# Patient Record
Sex: Male | Born: 1945
Health system: Southern US, Community
[De-identification: ages and names within clinical notes are randomized; demographics above are authoritative.]

## PROBLEM LIST (undated history)

## (undated) DIAGNOSIS — I2510 Atherosclerotic heart disease of native coronary artery without angina pectoris: Secondary | ICD-10-CM

## (undated) DIAGNOSIS — I10 Essential (primary) hypertension: Secondary | ICD-10-CM

## (undated) DIAGNOSIS — I219 Acute myocardial infarction, unspecified: Secondary | ICD-10-CM

## (undated) DIAGNOSIS — E785 Hyperlipidemia, unspecified: Secondary | ICD-10-CM

## (undated) DIAGNOSIS — M1990 Unspecified osteoarthritis, unspecified site: Secondary | ICD-10-CM

## (undated) DIAGNOSIS — R519 Headache, unspecified: Secondary | ICD-10-CM

## (undated) DIAGNOSIS — C8590 Non-Hodgkin lymphoma, unspecified, unspecified site: Secondary | ICD-10-CM

## (undated) DIAGNOSIS — N400 Enlarged prostate without lower urinary tract symptoms: Secondary | ICD-10-CM

## (undated) DIAGNOSIS — G510 Bell's palsy: Secondary | ICD-10-CM

## (undated) HISTORY — PX: COLONOSCOPY: SHX174

## (undated) HISTORY — PX: TONSILLECTOMY: SUR1361

---

## 1967-09-02 HISTORY — PX: CRANIOTOMY: SHX93

## 2001-09-06 ENCOUNTER — Ambulatory Visit (HOSPITAL_COMMUNITY): Admission: RE | Admit: 2001-09-06 | Discharge: 2001-09-06 | Payer: Self-pay | Admitting: Gastroenterology

## 2001-09-06 ENCOUNTER — Encounter (INDEPENDENT_AMBULATORY_CARE_PROVIDER_SITE_OTHER): Payer: Self-pay | Admitting: Specialist

## 2004-10-22 ENCOUNTER — Emergency Department (HOSPITAL_COMMUNITY): Admission: EM | Admit: 2004-10-22 | Discharge: 2004-10-22 | Payer: Self-pay | Admitting: Family Medicine

## 2006-05-12 ENCOUNTER — Emergency Department (HOSPITAL_COMMUNITY): Admission: EM | Admit: 2006-05-12 | Discharge: 2006-05-12 | Payer: Self-pay | Admitting: Family Medicine

## 2006-10-22 ENCOUNTER — Encounter (INDEPENDENT_AMBULATORY_CARE_PROVIDER_SITE_OTHER): Payer: Self-pay | Admitting: Specialist

## 2006-10-22 ENCOUNTER — Ambulatory Visit (HOSPITAL_BASED_OUTPATIENT_CLINIC_OR_DEPARTMENT_OTHER): Admission: RE | Admit: 2006-10-22 | Discharge: 2006-10-22 | Payer: Self-pay | Admitting: Orthopedic Surgery

## 2008-11-16 ENCOUNTER — Encounter: Admission: RE | Admit: 2008-11-16 | Discharge: 2008-11-16 | Payer: Self-pay | Admitting: Family Medicine

## 2008-11-22 ENCOUNTER — Ambulatory Visit (HOSPITAL_COMMUNITY): Admission: RE | Admit: 2008-11-22 | Discharge: 2008-11-22 | Payer: Self-pay | Admitting: Family Medicine

## 2008-11-28 ENCOUNTER — Encounter (INDEPENDENT_AMBULATORY_CARE_PROVIDER_SITE_OTHER): Payer: Self-pay | Admitting: Interventional Radiology

## 2008-11-28 ENCOUNTER — Encounter (INDEPENDENT_AMBULATORY_CARE_PROVIDER_SITE_OTHER): Payer: Self-pay | Admitting: Family Medicine

## 2008-11-28 ENCOUNTER — Ambulatory Visit (HOSPITAL_COMMUNITY): Admission: RE | Admit: 2008-11-28 | Discharge: 2008-11-28 | Payer: Self-pay | Admitting: Family Medicine

## 2008-12-05 ENCOUNTER — Ambulatory Visit: Payer: Self-pay | Admitting: Internal Medicine

## 2008-12-06 LAB — CBC WITH DIFFERENTIAL/PLATELET
BASO%: 0.1 % (ref 0.0–2.0)
LYMPH%: 13.2 % — ABNORMAL LOW (ref 14.0–49.0)
MCHC: 33.5 g/dL (ref 32.0–36.0)
MONO#: 0.5 10*3/uL (ref 0.1–0.9)
Platelets: 344 10*3/uL (ref 140–400)
RBC: 5.32 10*6/uL (ref 4.20–5.82)
RDW: 13 % (ref 11.0–14.6)
WBC: 6.8 10*3/uL (ref 4.0–10.3)
lymph#: 0.9 10*3/uL (ref 0.9–3.3)

## 2008-12-06 LAB — COMPREHENSIVE METABOLIC PANEL
ALT: 23 U/L (ref 0–53)
CO2: 24 mEq/L (ref 19–32)
Potassium: 4.4 mEq/L (ref 3.5–5.3)
Sodium: 138 mEq/L (ref 135–145)
Total Bilirubin: 0.5 mg/dL (ref 0.3–1.2)
Total Protein: 6.4 g/dL (ref 6.0–8.3)

## 2008-12-06 LAB — LACTATE DEHYDROGENASE: LDH: 450 U/L — ABNORMAL HIGH (ref 94–250)

## 2008-12-07 ENCOUNTER — Other Ambulatory Visit: Admission: RE | Admit: 2008-12-07 | Discharge: 2008-12-07 | Payer: Self-pay | Admitting: Internal Medicine

## 2008-12-07 ENCOUNTER — Encounter: Payer: Self-pay | Admitting: Internal Medicine

## 2008-12-08 ENCOUNTER — Encounter: Payer: Self-pay | Admitting: Internal Medicine

## 2008-12-08 ENCOUNTER — Ambulatory Visit: Payer: Self-pay

## 2008-12-12 LAB — COMPREHENSIVE METABOLIC PANEL WITH GFR
ALT: 17 U/L (ref 0–53)
AST: 15 U/L (ref 0–37)
Albumin: 4.1 g/dL (ref 3.5–5.2)
Alkaline Phosphatase: 141 U/L — ABNORMAL HIGH (ref 39–117)
BUN: 16 mg/dL (ref 6–23)
CO2: 25 meq/L (ref 19–32)
Calcium: 9.4 mg/dL (ref 8.4–10.5)
Chloride: 102 meq/L (ref 96–112)
Creatinine, Ser: 0.71 mg/dL (ref 0.40–1.50)
Glucose, Bld: 127 mg/dL — ABNORMAL HIGH (ref 70–99)
Potassium: 4.6 meq/L (ref 3.5–5.3)
Sodium: 139 meq/L (ref 135–145)
Total Bilirubin: 0.6 mg/dL (ref 0.3–1.2)
Total Protein: 6.6 g/dL (ref 6.0–8.3)

## 2008-12-12 LAB — CBC WITH DIFFERENTIAL/PLATELET
BASO%: 0.3 % (ref 0.0–2.0)
Basophils Absolute: 0 10e3/uL (ref 0.0–0.1)
EOS%: 1 % (ref 0.0–7.0)
Eosinophils Absolute: 0.1 10e3/uL (ref 0.0–0.5)
HCT: 44.2 % (ref 38.4–49.9)
HGB: 15.3 g/dL (ref 13.0–17.1)
LYMPH%: 7.2 % — ABNORMAL LOW (ref 14.0–49.0)
MCH: 28.2 pg (ref 27.2–33.4)
MCHC: 34.6 g/dL (ref 32.0–36.0)
MCV: 81.5 fL (ref 79.3–98.0)
MONO#: 0.5 10e3/uL (ref 0.1–0.9)
MONO%: 7.5 % (ref 0.0–14.0)
NEUT#: 6 10e3/uL (ref 1.5–6.5)
NEUT%: 84 % — ABNORMAL HIGH (ref 39.0–75.0)
Platelets: 317 10e3/uL (ref 140–400)
RBC: 5.42 10e6/uL (ref 4.20–5.82)
RDW: 13.1 % (ref 11.0–14.6)
WBC: 7.1 10e3/uL (ref 4.0–10.3)
lymph#: 0.5 10e3/uL — ABNORMAL LOW (ref 0.9–3.3)

## 2008-12-12 LAB — URIC ACID: Uric Acid, Serum: 3 mg/dL — ABNORMAL LOW (ref 4.0–7.8)

## 2008-12-12 LAB — LACTATE DEHYDROGENASE: LDH: 631 U/L — ABNORMAL HIGH (ref 94–250)

## 2008-12-20 LAB — CBC WITH DIFFERENTIAL/PLATELET
BASO%: 0.8 % (ref 0.0–2.0)
Basophils Absolute: 0 10*3/uL (ref 0.0–0.1)
EOS%: 3.5 % (ref 0.0–7.0)
HCT: 42.1 % (ref 38.4–49.9)
HGB: 14.1 g/dL (ref 13.0–17.1)
LYMPH%: 31.7 % (ref 14.0–49.0)
MCH: 28.1 pg (ref 27.2–33.4)
MCHC: 33.5 g/dL (ref 32.0–36.0)
MCV: 83.7 fL (ref 79.3–98.0)
MONO%: 12.5 % (ref 0.0–14.0)
NEUT%: 51.5 % (ref 39.0–75.0)

## 2008-12-20 LAB — URIC ACID: Uric Acid, Serum: 2.1 mg/dL — ABNORMAL LOW (ref 4.0–7.8)

## 2008-12-20 LAB — COMPREHENSIVE METABOLIC PANEL
Albumin: 4.1 g/dL (ref 3.5–5.2)
CO2: 27 mEq/L (ref 19–32)
Calcium: 8.8 mg/dL (ref 8.4–10.5)
Chloride: 102 mEq/L (ref 96–112)
Glucose, Bld: 100 mg/dL — ABNORMAL HIGH (ref 70–99)
Potassium: 4.5 mEq/L (ref 3.5–5.3)
Sodium: 137 mEq/L (ref 135–145)
Total Bilirubin: 0.3 mg/dL (ref 0.3–1.2)
Total Protein: 5.9 g/dL — ABNORMAL LOW (ref 6.0–8.3)

## 2008-12-20 LAB — LACTATE DEHYDROGENASE: LDH: 165 U/L (ref 94–250)

## 2008-12-27 LAB — CBC WITH DIFFERENTIAL/PLATELET
BASO%: 0 % (ref 0.0–2.0)
EOS%: 0.1 % (ref 0.0–7.0)
MCH: 28.5 pg (ref 27.2–33.4)
MCHC: 34.2 g/dL (ref 32.0–36.0)
MONO%: 5.1 % (ref 0.0–14.0)
RBC: 4.96 10*6/uL (ref 4.20–5.82)
RDW: 13.4 % (ref 11.0–14.6)
lymph#: 0.8 10*3/uL — ABNORMAL LOW (ref 0.9–3.3)

## 2008-12-27 LAB — COMPREHENSIVE METABOLIC PANEL
ALT: 12 U/L (ref 0–53)
AST: 11 U/L (ref 0–37)
Albumin: 4.1 g/dL (ref 3.5–5.2)
Alkaline Phosphatase: 115 U/L (ref 39–117)
Calcium: 8.9 mg/dL (ref 8.4–10.5)
Chloride: 105 mEq/L (ref 96–112)
Potassium: 4.3 mEq/L (ref 3.5–5.3)

## 2009-01-10 LAB — CBC WITH DIFFERENTIAL/PLATELET
BASO%: 3.5 % — ABNORMAL HIGH (ref 0.0–2.0)
Basophils Absolute: 0 10*3/uL (ref 0.0–0.1)
EOS%: 3.5 % (ref 0.0–7.0)
HGB: 13.3 g/dL (ref 13.0–17.1)
MCH: 28.1 pg (ref 27.2–33.4)
MCHC: 34.9 g/dL (ref 32.0–36.0)
RBC: 4.74 10*6/uL (ref 4.20–5.82)
RDW: 13.7 % (ref 11.0–14.6)
lymph#: 0.4 10*3/uL — ABNORMAL LOW (ref 0.9–3.3)

## 2009-01-10 LAB — COMPREHENSIVE METABOLIC PANEL
ALT: 15 U/L (ref 0–53)
AST: 10 U/L (ref 0–37)
Albumin: 4.2 g/dL (ref 3.5–5.2)
Calcium: 9.2 mg/dL (ref 8.4–10.5)
Chloride: 103 mEq/L (ref 96–112)
Potassium: 4.3 mEq/L (ref 3.5–5.3)
Sodium: 138 mEq/L (ref 135–145)
Total Protein: 6.2 g/dL (ref 6.0–8.3)

## 2009-01-17 ENCOUNTER — Ambulatory Visit: Payer: Self-pay | Admitting: Internal Medicine

## 2009-01-17 LAB — CBC WITH DIFFERENTIAL/PLATELET
BASO%: 0.2 % (ref 0.0–2.0)
EOS%: 0.2 % (ref 0.0–7.0)
LYMPH%: 7.1 % — ABNORMAL LOW (ref 14.0–49.0)
MCHC: 34.1 g/dL (ref 32.0–36.0)
MCV: 84.3 fL (ref 79.3–98.0)
MONO%: 5.5 % (ref 0.0–14.0)
Platelets: 227 10*3/uL (ref 140–400)
RBC: 4.64 10*6/uL (ref 4.20–5.82)
RDW: 15.1 % — ABNORMAL HIGH (ref 11.0–14.6)

## 2009-01-17 LAB — COMPREHENSIVE METABOLIC PANEL
ALT: 12 U/L (ref 0–53)
AST: 10 U/L (ref 0–37)
Alkaline Phosphatase: 109 U/L (ref 39–117)
Sodium: 141 mEq/L (ref 135–145)
Total Bilirubin: 0.2 mg/dL — ABNORMAL LOW (ref 0.3–1.2)
Total Protein: 5.7 g/dL — ABNORMAL LOW (ref 6.0–8.3)

## 2009-01-17 LAB — URIC ACID: Uric Acid, Serum: 2.6 mg/dL — ABNORMAL LOW (ref 4.0–7.8)

## 2009-01-23 LAB — CBC WITH DIFFERENTIAL/PLATELET
BASO%: 0.7 % (ref 0.0–2.0)
EOS%: 0 % (ref 0.0–7.0)
MCH: 28.2 pg (ref 27.2–33.4)
MCHC: 34.3 g/dL (ref 32.0–36.0)
MCV: 82.2 fL (ref 79.3–98.0)
MONO%: 8.6 % (ref 0.0–14.0)
RBC: 5.11 10*6/uL (ref 4.20–5.82)
RDW: 15.7 % — ABNORMAL HIGH (ref 11.0–14.6)
lymph#: 0.6 10*3/uL — ABNORMAL LOW (ref 0.9–3.3)
nRBC: 0 % (ref 0–0)

## 2009-01-23 LAB — COMPREHENSIVE METABOLIC PANEL
ALT: 16 U/L (ref 0–53)
CO2: 23 mEq/L (ref 19–32)
Calcium: 9.1 mg/dL (ref 8.4–10.5)
Chloride: 105 mEq/L (ref 96–112)
Creatinine, Ser: 0.63 mg/dL (ref 0.40–1.50)
Glucose, Bld: 124 mg/dL — ABNORMAL HIGH (ref 70–99)

## 2009-01-23 LAB — URIC ACID: Uric Acid, Serum: 2.7 mg/dL — ABNORMAL LOW (ref 4.0–7.8)

## 2009-01-23 LAB — LACTATE DEHYDROGENASE: LDH: 174 U/L (ref 94–250)

## 2009-01-31 LAB — COMPREHENSIVE METABOLIC PANEL
Alkaline Phosphatase: 90 U/L (ref 39–117)
BUN: 16 mg/dL (ref 6–23)
CO2: 27 mEq/L (ref 19–32)
Creatinine, Ser: 0.73 mg/dL (ref 0.40–1.50)
Glucose, Bld: 131 mg/dL — ABNORMAL HIGH (ref 70–99)
Total Bilirubin: 0.4 mg/dL (ref 0.3–1.2)
Total Protein: 6.1 g/dL (ref 6.0–8.3)

## 2009-01-31 LAB — CBC WITH DIFFERENTIAL/PLATELET
Basophils Absolute: 0 10*3/uL (ref 0.0–0.1)
Eosinophils Absolute: 0 10*3/uL (ref 0.0–0.5)
HCT: 36.4 % — ABNORMAL LOW (ref 38.4–49.9)
HGB: 12.4 g/dL — ABNORMAL LOW (ref 13.0–17.1)
LYMPH%: 35.7 % (ref 14.0–49.0)
MCV: 84.5 fL (ref 79.3–98.0)
MONO#: 0.3 10*3/uL (ref 0.1–0.9)
MONO%: 23.9 % — ABNORMAL HIGH (ref 0.0–14.0)
NEUT#: 0.4 10*3/uL — CL (ref 1.5–6.5)
NEUT%: 36.3 % — ABNORMAL LOW (ref 39.0–75.0)
Platelets: 162 10*3/uL (ref 140–400)
RBC: 4.31 10*6/uL (ref 4.20–5.82)
WBC: 1.1 10*3/uL — ABNORMAL LOW (ref 4.0–10.3)

## 2009-01-31 LAB — LACTATE DEHYDROGENASE: LDH: 95 U/L (ref 94–250)

## 2009-01-31 LAB — URIC ACID: Uric Acid, Serum: 2.6 mg/dL — ABNORMAL LOW (ref 4.0–7.8)

## 2009-02-06 ENCOUNTER — Ambulatory Visit (HOSPITAL_COMMUNITY): Admission: RE | Admit: 2009-02-06 | Discharge: 2009-02-06 | Payer: Self-pay | Admitting: Internal Medicine

## 2009-02-06 LAB — CBC WITH DIFFERENTIAL/PLATELET
BASO%: 0.6 % (ref 0.0–2.0)
Basophils Absolute: 0 10*3/uL (ref 0.0–0.1)
EOS%: 0.5 % (ref 0.0–7.0)
HCT: 42.3 % (ref 38.4–49.9)
HGB: 14.5 g/dL (ref 13.0–17.1)
MCH: 28.7 pg (ref 27.2–33.4)
MCHC: 34.3 g/dL (ref 32.0–36.0)
MONO#: 0.5 10*3/uL (ref 0.1–0.9)
NEUT%: 83.7 % — ABNORMAL HIGH (ref 39.0–75.0)
RDW: 16.9 % — ABNORMAL HIGH (ref 11.0–14.6)
WBC: 6.6 10*3/uL (ref 4.0–10.3)
lymph#: 0.5 10*3/uL — ABNORMAL LOW (ref 0.9–3.3)

## 2009-02-06 LAB — COMPREHENSIVE METABOLIC PANEL
ALT: 15 U/L (ref 0–53)
AST: 14 U/L (ref 0–37)
Alkaline Phosphatase: 104 U/L (ref 39–117)
Creatinine, Ser: 0.68 mg/dL (ref 0.40–1.50)
Sodium: 141 mEq/L (ref 135–145)
Total Bilirubin: 0.4 mg/dL (ref 0.3–1.2)
Total Protein: 6.3 g/dL (ref 6.0–8.3)

## 2009-02-12 LAB — COMPREHENSIVE METABOLIC PANEL
AST: 14 U/L (ref 0–37)
Albumin: 4 g/dL (ref 3.5–5.2)
Alkaline Phosphatase: 92 U/L (ref 39–117)
BUN: 14 mg/dL (ref 6–23)
Calcium: 9 mg/dL (ref 8.4–10.5)
Chloride: 105 mEq/L (ref 96–112)
Creatinine, Ser: 0.75 mg/dL (ref 0.40–1.50)
Glucose, Bld: 98 mg/dL (ref 70–99)
Potassium: 4.5 mEq/L (ref 3.5–5.3)

## 2009-02-12 LAB — CBC WITH DIFFERENTIAL/PLATELET
Basophils Absolute: 0 10*3/uL (ref 0.0–0.1)
EOS%: 0.1 % (ref 0.0–7.0)
Eosinophils Absolute: 0 10*3/uL (ref 0.0–0.5)
HCT: 39.1 % (ref 38.4–49.9)
HGB: 13.6 g/dL (ref 13.0–17.1)
MCH: 29.9 pg (ref 27.2–33.4)
MCV: 85.6 fL (ref 79.3–98.0)
MONO%: 16 % — ABNORMAL HIGH (ref 0.0–14.0)
NEUT#: 3.5 10*3/uL (ref 1.5–6.5)
NEUT%: 69.5 % (ref 39.0–75.0)

## 2009-02-20 LAB — CBC WITH DIFFERENTIAL/PLATELET
Basophils Absolute: 0 10*3/uL (ref 0.0–0.1)
Eosinophils Absolute: 0 10*3/uL (ref 0.0–0.5)
HCT: 36.3 % — ABNORMAL LOW (ref 38.4–49.9)
HGB: 12.9 g/dL — ABNORMAL LOW (ref 13.0–17.1)
LYMPH%: 25.9 % (ref 14.0–49.0)
MONO#: 0.1 10*3/uL (ref 0.1–0.9)
NEUT#: 0.6 10*3/uL — ABNORMAL LOW (ref 1.5–6.5)
Platelets: 146 10*3/uL (ref 140–400)
RBC: 4.24 10*6/uL (ref 4.20–5.82)
WBC: 1 10*3/uL — ABNORMAL LOW (ref 4.0–10.3)

## 2009-02-20 LAB — COMPREHENSIVE METABOLIC PANEL
Albumin: 4.2 g/dL (ref 3.5–5.2)
BUN: 17 mg/dL (ref 6–23)
CO2: 29 mEq/L (ref 19–32)
Glucose, Bld: 125 mg/dL — ABNORMAL HIGH (ref 70–99)
Sodium: 137 mEq/L (ref 135–145)
Total Bilirubin: 0.7 mg/dL (ref 0.3–1.2)
Total Protein: 6.4 g/dL (ref 6.0–8.3)

## 2009-02-20 LAB — LACTATE DEHYDROGENASE: LDH: 152 U/L (ref 94–250)

## 2009-02-23 ENCOUNTER — Ambulatory Visit: Payer: Self-pay | Admitting: Internal Medicine

## 2009-02-27 LAB — LACTATE DEHYDROGENASE: LDH: 209 U/L (ref 94–250)

## 2009-02-27 LAB — COMPREHENSIVE METABOLIC PANEL
ALT: 17 U/L (ref 0–53)
CO2: 25 mEq/L (ref 19–32)
Calcium: 9 mg/dL (ref 8.4–10.5)
Chloride: 105 mEq/L (ref 96–112)
Creatinine, Ser: 0.75 mg/dL (ref 0.40–1.50)

## 2009-02-27 LAB — CBC WITH DIFFERENTIAL/PLATELET
BASO%: 0 % (ref 0.0–2.0)
HCT: 39.1 % (ref 38.4–49.9)
MCHC: 34.3 g/dL (ref 32.0–36.0)
MONO#: 0.6 10*3/uL (ref 0.1–0.9)
NEUT#: 7.1 10*3/uL — ABNORMAL HIGH (ref 1.5–6.5)
RBC: 4.48 10*6/uL (ref 4.20–5.82)
WBC: 8.3 10*3/uL (ref 4.0–10.3)
lymph#: 0.6 10*3/uL — ABNORMAL LOW (ref 0.9–3.3)

## 2009-03-06 LAB — COMPREHENSIVE METABOLIC PANEL
Albumin: 4.1 g/dL (ref 3.5–5.2)
Alkaline Phosphatase: 132 U/L — ABNORMAL HIGH (ref 39–117)
BUN: 13 mg/dL (ref 6–23)
Creatinine, Ser: 0.58 mg/dL (ref 0.40–1.50)
Glucose, Bld: 124 mg/dL — ABNORMAL HIGH (ref 70–99)
Total Bilirubin: 0.5 mg/dL (ref 0.3–1.2)

## 2009-03-06 LAB — CBC WITH DIFFERENTIAL/PLATELET
Basophils Absolute: 0 10*3/uL (ref 0.0–0.1)
EOS%: 0 % (ref 0.0–7.0)
Eosinophils Absolute: 0 10*3/uL (ref 0.0–0.5)
HGB: 13.6 g/dL (ref 13.0–17.1)
LYMPH%: 12.1 % — ABNORMAL LOW (ref 14.0–49.0)
MCH: 29.2 pg (ref 27.2–33.4)
MCV: 85.2 fL (ref 79.3–98.0)
MONO%: 9 % (ref 0.0–14.0)
NEUT#: 5.3 10*3/uL (ref 1.5–6.5)
NEUT%: 78.3 % — ABNORMAL HIGH (ref 39.0–75.0)
Platelets: 258 10*3/uL (ref 140–400)

## 2009-03-22 ENCOUNTER — Ambulatory Visit: Payer: Self-pay | Admitting: Internal Medicine

## 2009-03-26 LAB — COMPREHENSIVE METABOLIC PANEL
Albumin: 4.3 g/dL (ref 3.5–5.2)
Alkaline Phosphatase: 134 U/L — ABNORMAL HIGH (ref 39–117)
CO2: 24 mEq/L (ref 19–32)
Calcium: 9.4 mg/dL (ref 8.4–10.5)
Chloride: 107 mEq/L (ref 96–112)
Glucose, Bld: 99 mg/dL (ref 70–99)
Potassium: 5 mEq/L (ref 3.5–5.3)
Sodium: 138 mEq/L (ref 135–145)
Total Protein: 6.4 g/dL (ref 6.0–8.3)

## 2009-03-26 LAB — CBC WITH DIFFERENTIAL/PLATELET
BASO%: 0.5 % (ref 0.0–2.0)
LYMPH%: 12.1 % — ABNORMAL LOW (ref 14.0–49.0)
MCHC: 33.3 g/dL (ref 32.0–36.0)
MONO#: 0.8 10*3/uL (ref 0.1–0.9)
MONO%: 12.2 % (ref 0.0–14.0)
Platelets: 211 10*3/uL (ref 140–400)
RBC: 4.48 10*6/uL (ref 4.20–5.82)
WBC: 6.6 10*3/uL (ref 4.0–10.3)

## 2009-04-11 ENCOUNTER — Ambulatory Visit (HOSPITAL_COMMUNITY): Admission: RE | Admit: 2009-04-11 | Discharge: 2009-04-11 | Payer: Self-pay | Admitting: Internal Medicine

## 2009-04-16 LAB — COMPREHENSIVE METABOLIC PANEL
ALT: 14 U/L (ref 0–53)
AST: 20 U/L (ref 0–37)
Alkaline Phosphatase: 117 U/L (ref 39–117)
BUN: 13 mg/dL (ref 6–23)
Creatinine, Ser: 0.63 mg/dL (ref 0.40–1.50)
Potassium: 4.8 mEq/L (ref 3.5–5.3)

## 2009-04-16 LAB — CBC WITH DIFFERENTIAL/PLATELET
BASO%: 0.4 % (ref 0.0–2.0)
Basophils Absolute: 0 10*3/uL (ref 0.0–0.1)
EOS%: 0.1 % (ref 0.0–7.0)
HGB: 13 g/dL (ref 13.0–17.1)
MCH: 29.4 pg (ref 27.2–33.4)
MCHC: 33.2 g/dL (ref 32.0–36.0)
MCV: 88.5 fL (ref 79.3–98.0)
MONO%: 16.3 % — ABNORMAL HIGH (ref 0.0–14.0)
RDW: 14.3 % (ref 11.0–14.6)
lymph#: 0.5 10*3/uL — ABNORMAL LOW (ref 0.9–3.3)

## 2009-07-04 ENCOUNTER — Ambulatory Visit: Payer: Self-pay | Admitting: Internal Medicine

## 2009-07-06 ENCOUNTER — Ambulatory Visit (HOSPITAL_COMMUNITY): Admission: RE | Admit: 2009-07-06 | Discharge: 2009-07-06 | Payer: Self-pay | Admitting: Internal Medicine

## 2009-07-06 LAB — CBC WITH DIFFERENTIAL/PLATELET
BASO%: 0.4 % (ref 0.0–2.0)
EOS%: 8.3 % — ABNORMAL HIGH (ref 0.0–7.0)
HCT: 48.7 % (ref 38.4–49.9)
MCH: 28.1 pg (ref 27.2–33.4)
MCHC: 33.8 g/dL (ref 32.0–36.0)
MONO#: 0.5 10*3/uL (ref 0.1–0.9)
NEUT%: 51.1 % (ref 39.0–75.0)
RBC: 5.85 10*6/uL — ABNORMAL HIGH (ref 4.20–5.82)
RDW: 15.8 % — ABNORMAL HIGH (ref 11.0–14.6)
WBC: 4.6 10*3/uL (ref 4.0–10.3)
lymph#: 1.4 10*3/uL (ref 0.9–3.3)

## 2009-07-06 LAB — COMPREHENSIVE METABOLIC PANEL
ALT: 35 U/L (ref 0–53)
AST: 30 U/L (ref 0–37)
Albumin: 4.4 g/dL (ref 3.5–5.2)
CO2: 27 mEq/L (ref 19–32)
Calcium: 9.3 mg/dL (ref 8.4–10.5)
Chloride: 105 mEq/L (ref 96–112)
Creatinine, Ser: 0.73 mg/dL (ref 0.40–1.50)
Potassium: 4.2 mEq/L (ref 3.5–5.3)
Sodium: 138 mEq/L (ref 135–145)
Total Protein: 6.7 g/dL (ref 6.0–8.3)

## 2009-07-06 LAB — LACTATE DEHYDROGENASE: LDH: 218 U/L (ref 94–250)

## 2009-10-09 ENCOUNTER — Ambulatory Visit: Payer: Self-pay | Admitting: Internal Medicine

## 2009-10-11 LAB — COMPREHENSIVE METABOLIC PANEL
Albumin: 4.5 g/dL (ref 3.5–5.2)
BUN: 17 mg/dL (ref 6–23)
Calcium: 9.1 mg/dL (ref 8.4–10.5)
Chloride: 107 mEq/L (ref 96–112)
Creatinine, Ser: 0.81 mg/dL (ref 0.40–1.50)
Glucose, Bld: 123 mg/dL — ABNORMAL HIGH (ref 70–99)
Potassium: 3.9 mEq/L (ref 3.5–5.3)

## 2009-10-11 LAB — CBC WITH DIFFERENTIAL/PLATELET
Basophils Absolute: 0 10*3/uL (ref 0.0–0.1)
EOS%: 6.3 % (ref 0.0–7.0)
Eosinophils Absolute: 0.3 10*3/uL (ref 0.0–0.5)
HCT: 43.8 % (ref 38.4–49.9)
HGB: 15 g/dL (ref 13.0–17.1)
MCH: 29.1 pg (ref 27.2–33.4)
MCV: 84.9 fL (ref 79.3–98.0)
NEUT#: 2.4 10*3/uL (ref 1.5–6.5)
NEUT%: 57.5 % (ref 39.0–75.0)
RDW: 14.5 % (ref 11.0–14.6)
lymph#: 1.1 10*3/uL (ref 0.9–3.3)

## 2009-10-11 LAB — LACTATE DEHYDROGENASE: LDH: 137 U/L (ref 94–250)

## 2009-10-12 ENCOUNTER — Ambulatory Visit (HOSPITAL_COMMUNITY): Admission: RE | Admit: 2009-10-12 | Discharge: 2009-10-12 | Payer: Self-pay | Admitting: Internal Medicine

## 2010-01-01 ENCOUNTER — Ambulatory Visit: Payer: Self-pay | Admitting: Internal Medicine

## 2010-01-02 LAB — CBC WITH DIFFERENTIAL/PLATELET
BASO%: 0.7 % (ref 0.0–2.0)
Basophils Absolute: 0 10*3/uL (ref 0.0–0.1)
EOS%: 7.2 % — ABNORMAL HIGH (ref 0.0–7.0)
HCT: 48.2 % (ref 38.4–49.9)
HGB: 16.6 g/dL (ref 13.0–17.1)
LYMPH%: 19.5 % (ref 14.0–49.0)
MCH: 29.2 pg (ref 27.2–33.4)
MCHC: 34.4 g/dL (ref 32.0–36.0)
MCV: 84.7 fL (ref 79.3–98.0)
NEUT%: 62.8 % (ref 39.0–75.0)
Platelets: 203 10*3/uL (ref 140–400)

## 2010-01-02 LAB — COMPREHENSIVE METABOLIC PANEL
ALT: 18 U/L (ref 0–53)
AST: 17 U/L (ref 0–37)
BUN: 16 mg/dL (ref 6–23)
Calcium: 9 mg/dL (ref 8.4–10.5)
Creatinine, Ser: 0.64 mg/dL (ref 0.40–1.50)
Total Bilirubin: 0.4 mg/dL (ref 0.3–1.2)

## 2010-01-02 LAB — LACTATE DEHYDROGENASE: LDH: 133 U/L (ref 94–250)

## 2010-03-01 ENCOUNTER — Ambulatory Visit: Payer: Self-pay | Admitting: Internal Medicine

## 2010-03-06 LAB — COMPREHENSIVE METABOLIC PANEL
Alkaline Phosphatase: 92 U/L (ref 39–117)
Creatinine, Ser: 0.67 mg/dL (ref 0.40–1.50)
Glucose, Bld: 105 mg/dL — ABNORMAL HIGH (ref 70–99)
Sodium: 139 mEq/L (ref 135–145)
Total Bilirubin: 0.6 mg/dL (ref 0.3–1.2)
Total Protein: 6.6 g/dL (ref 6.0–8.3)

## 2010-03-06 LAB — CBC WITH DIFFERENTIAL/PLATELET
BASO%: 0.6 % (ref 0.0–2.0)
Basophils Absolute: 0 10*3/uL (ref 0.0–0.1)
Eosinophils Absolute: 0.3 10*3/uL (ref 0.0–0.5)
HCT: 46.1 % (ref 38.4–49.9)
HGB: 16 g/dL (ref 13.0–17.1)
LYMPH%: 27.8 % (ref 14.0–49.0)
MCHC: 34.7 g/dL (ref 32.0–36.0)
MONO#: 0.7 10*3/uL (ref 0.1–0.9)
NEUT%: 45.3 % (ref 39.0–75.0)
Platelets: 187 10*3/uL (ref 140–400)
WBC: 3.5 10*3/uL — ABNORMAL LOW (ref 4.0–10.3)

## 2010-05-13 ENCOUNTER — Ambulatory Visit: Payer: Self-pay | Admitting: Internal Medicine

## 2010-05-13 ENCOUNTER — Ambulatory Visit (HOSPITAL_COMMUNITY): Admission: RE | Admit: 2010-05-13 | Discharge: 2010-05-13 | Payer: Self-pay | Admitting: Internal Medicine

## 2010-06-06 LAB — CBC WITH DIFFERENTIAL/PLATELET
Eosinophils Absolute: 0.2 10*3/uL (ref 0.0–0.5)
MONO#: 0.5 10*3/uL (ref 0.1–0.9)
NEUT#: 1.7 10*3/uL (ref 1.5–6.5)
RBC: 5.46 10*6/uL (ref 4.20–5.82)
RDW: 13.5 % (ref 11.0–14.6)
WBC: 3.5 10*3/uL — ABNORMAL LOW (ref 4.0–10.3)

## 2010-06-06 LAB — COMPREHENSIVE METABOLIC PANEL
Albumin: 4.7 g/dL (ref 3.5–5.2)
Alkaline Phosphatase: 89 U/L (ref 39–117)
CO2: 21 mEq/L (ref 19–32)
Glucose, Bld: 106 mg/dL — ABNORMAL HIGH (ref 70–99)
Potassium: 4.5 mEq/L (ref 3.5–5.3)
Sodium: 138 mEq/L (ref 135–145)
Total Protein: 6.7 g/dL (ref 6.0–8.3)

## 2010-06-06 LAB — LACTATE DEHYDROGENASE: LDH: 209 U/L (ref 94–250)

## 2010-06-26 ENCOUNTER — Ambulatory Visit: Payer: Self-pay | Admitting: Internal Medicine

## 2010-09-20 ENCOUNTER — Other Ambulatory Visit: Payer: Self-pay | Admitting: Internal Medicine

## 2010-09-20 DIAGNOSIS — Z8572 Personal history of non-Hodgkin lymphomas: Secondary | ICD-10-CM

## 2010-09-22 ENCOUNTER — Encounter: Payer: Self-pay | Admitting: Internal Medicine

## 2010-11-29 ENCOUNTER — Other Ambulatory Visit (HOSPITAL_COMMUNITY): Payer: Self-pay

## 2010-11-29 ENCOUNTER — Encounter (HOSPITAL_COMMUNITY): Payer: Self-pay

## 2010-11-29 ENCOUNTER — Ambulatory Visit (HOSPITAL_COMMUNITY)
Admission: RE | Admit: 2010-11-29 | Discharge: 2010-11-29 | Disposition: A | Discharge status: Home / Self Care | Payer: 59 | Source: Ambulatory Visit | Attending: Internal Medicine | Admitting: Internal Medicine

## 2010-11-29 DIAGNOSIS — M949 Disorder of cartilage, unspecified: Secondary | ICD-10-CM | POA: Insufficient documentation

## 2010-11-29 DIAGNOSIS — M899 Disorder of bone, unspecified: Secondary | ICD-10-CM | POA: Insufficient documentation

## 2010-11-29 DIAGNOSIS — C8589 Other specified types of non-Hodgkin lymphoma, extranodal and solid organ sites: Secondary | ICD-10-CM | POA: Insufficient documentation

## 2010-11-29 DIAGNOSIS — G9389 Other specified disorders of brain: Secondary | ICD-10-CM | POA: Insufficient documentation

## 2010-11-29 DIAGNOSIS — Z8572 Personal history of non-Hodgkin lymphomas: Secondary | ICD-10-CM

## 2010-11-29 HISTORY — DX: Non-Hodgkin lymphoma, unspecified, unspecified site: C85.90

## 2010-11-29 MED ORDER — IOHEXOL 300 MG/ML  SOLN
125.0000 mL | Freq: Once | INTRAMUSCULAR | Status: AC | PRN
  Administered 2010-11-29: 125 mL via INTRAVENOUS

## 2010-12-04 ENCOUNTER — Other Ambulatory Visit: Payer: Self-pay | Admitting: Internal Medicine

## 2010-12-04 ENCOUNTER — Encounter (HOSPITAL_BASED_OUTPATIENT_CLINIC_OR_DEPARTMENT_OTHER): Payer: 59 | Admitting: Internal Medicine

## 2010-12-04 DIAGNOSIS — C8590 Non-Hodgkin lymphoma, unspecified, unspecified site: Secondary | ICD-10-CM

## 2010-12-04 DIAGNOSIS — Z5111 Encounter for antineoplastic chemotherapy: Secondary | ICD-10-CM

## 2010-12-04 DIAGNOSIS — C8588 Other specified types of non-Hodgkin lymphoma, lymph nodes of multiple sites: Secondary | ICD-10-CM

## 2010-12-04 LAB — CBC WITH DIFFERENTIAL/PLATELET
Basophils Absolute: 0 10*3/uL (ref 0.0–0.1)
Eosinophils Absolute: 0.3 10*3/uL (ref 0.0–0.5)
HGB: 15.4 g/dL (ref 13.0–17.1)
MONO#: 0.5 10*3/uL (ref 0.1–0.9)
MONO%: 11.5 % (ref 0.0–14.0)
NEUT#: 2.7 10*3/uL (ref 1.5–6.5)
RBC: 5.24 10*6/uL (ref 4.20–5.82)
RDW: 13.9 % (ref 11.0–14.6)
WBC: 4.6 10*3/uL (ref 4.0–10.3)
lymph#: 1.1 10*3/uL (ref 0.9–3.3)

## 2010-12-04 LAB — COMPREHENSIVE METABOLIC PANEL
ALT: 16 U/L (ref 0–53)
AST: 15 U/L (ref 0–37)
Alkaline Phosphatase: 81 U/L (ref 39–117)
Sodium: 138 mEq/L (ref 135–145)
Total Bilirubin: 0.7 mg/dL (ref 0.3–1.2)
Total Protein: 6 g/dL (ref 6.0–8.3)

## 2010-12-04 LAB — LACTATE DEHYDROGENASE: LDH: 132 U/L (ref 94–250)

## 2010-12-11 LAB — CHROMOSOME ANALYSIS, BONE MARROW

## 2010-12-11 LAB — DIFFERENTIAL
Basophils Relative: 0 % (ref 0–1)
Lymphs Abs: 0.8 10*3/uL (ref 0.7–4.0)
Monocytes Relative: 8 % (ref 3–12)
Neutro Abs: 5.3 10*3/uL (ref 1.7–7.7)
Neutrophils Relative %: 79 % — ABNORMAL HIGH (ref 43–77)

## 2010-12-11 LAB — CBC
Platelets: 351 10*3/uL (ref 150–400)
RBC: 5.58 MIL/uL (ref 4.22–5.81)
WBC: 6.8 10*3/uL (ref 4.0–10.5)

## 2010-12-11 LAB — BONE MARROW EXAM: Bone Marrow Exam: 120

## 2010-12-12 LAB — CBC
MCV: 86.1 fL (ref 78.0–100.0)
Platelets: 309 10*3/uL (ref 150–400)
RBC: 5.85 MIL/uL — ABNORMAL HIGH (ref 4.22–5.81)
WBC: 6 10*3/uL (ref 4.0–10.5)

## 2010-12-12 LAB — PROTIME-INR: Prothrombin Time: 13.6 seconds (ref 11.6–15.2)

## 2010-12-12 LAB — BASIC METABOLIC PANEL
Chloride: 104 mEq/L (ref 96–112)
Creatinine, Ser: 0.7 mg/dL (ref 0.4–1.5)
GFR calc Af Amer: >60 mL/min (ref >60)
GFR calc non Af Amer: >60 mL/min (ref >60)
Potassium: 4.4 mEq/L (ref 3.5–5.1)

## 2010-12-12 LAB — APTT: aPTT: 25 seconds (ref 24–37)

## 2010-12-12 LAB — GLUCOSE, CAPILLARY: Glucose-Capillary: 124 mg/dL — ABNORMAL HIGH (ref 70–99)

## 2011-01-17 NOTE — Op Note (Signed)
NAME:  Edwin Morris, Edwin Morris                ACCOUNT NO.:  [id number]   MEDICAL RECORD NO.:  [id number]          PATIENT TYPE:  AMB   LOCATION:  DSC                          FACILITY:  [facility]   PHYSICIAN:  Cindee Salt, M.D.       DATE OF BIRTH:  October 13, 1945   DATE OF PROCEDURE:  10/22/2006  DATE OF DISCHARGE:                               OPERATIVE REPORT   PREOPERATIVE DIAGNOSIS:  Mucoid cyst right thumb.   POSTOPERATIVE DIAGNOSIS:  Mucoid cyst right thumb.   OPERATION:  Excision mucoid cyst, debridement interphalangeal joint,  right thumb.   SURGEON:  Cindee Salt, M.D.   ASSISTANT:  Carolyne Fiscal R.N.   ANESTHESIA:  Forearm based IV regional.   HISTORY:  The patient is a 65 year old male with a history of a cyst on  the interphalangeal joint of his right thumb.  This has become  translucent in the skin, is just proximal to the nail fold.  He is  desirous of removal.  X-rays revealed degenerative changes of the  interphalangeal joint.  In the preoperative area prior to surgery, risks  and complications of surgery were discussed.  He is aware there is no  guarantee with the surgery, possibility of infection, recurrence, injury  to arteries, nerves, tendons incomplete relief of symptoms, dystrophy.  He has elected to proceed to have this removed.  In the preoperative  area questions were encouraged and answered.  The extremity marked by  both the patient and surgeon.   PROCEDURE:  The patient was brought to the operating room where a  forearm based IV regional anesthetic was carried out without difficulty.  He was prepped using DuraPrep, supine position, right arm free.  A  curvilinear incision was made over the interphalangeal joint of the  right thumb, carried down through subcutaneous tissue.  Bleeders were  electrocauterized.  The cyst was then excised after tunneling onto the  skin distally.  The cyst was removed and sent to pathology with a small  rongeur.  The skin was left intact.  The  joint was then opened both  radially and ulnarly.  Osteophytes were removed with a small rongeur.  No further lesions were identified extensor tendon was intact.  The  wound was irrigated.  Skin was then closed interrupted 5-0 nylon  sutures.  A sterile compressive dressing and splint to the finger was  applied.  The patient tolerated the procedure well was taken to the  recovery observation in satisfactory condition.  He is discharged home  to return to the [facility] in one week on Vicodin.           ______________________________  Cindee Salt, M.D.     GK/MEDQ  D:  10/22/2006  T:  10/22/2006  Job:  [id number]

## 2011-02-03 ENCOUNTER — Other Ambulatory Visit: Payer: Self-pay | Admitting: Gastroenterology

## 2011-05-21 ENCOUNTER — Other Ambulatory Visit: Payer: Self-pay | Admitting: Orthopedic Surgery

## 2011-05-21 DIAGNOSIS — IMO0002 Reserved for concepts with insufficient information to code with codable children: Secondary | ICD-10-CM

## 2011-05-21 DIAGNOSIS — M549 Dorsalgia, unspecified: Secondary | ICD-10-CM

## 2011-05-22 MED ORDER — DIAZEPAM 2 MG PO TABS: 5.0000 mg | ORAL_TABLET | Freq: Once | ORAL | Status: DC

## 2011-05-23 ENCOUNTER — Ambulatory Visit
Admission: RE | Admit: 2011-05-23 | Discharge: 2011-05-23 | Disposition: A | Discharge status: Home / Self Care | Payer: 59 | Source: Ambulatory Visit | Attending: Orthopedic Surgery | Admitting: Orthopedic Surgery

## 2011-05-23 DIAGNOSIS — IMO0002 Reserved for concepts with insufficient information to code with codable children: Secondary | ICD-10-CM

## 2011-05-23 DIAGNOSIS — M549 Dorsalgia, unspecified: Secondary | ICD-10-CM

## 2011-05-23 MED ORDER — IOHEXOL 180 MG/ML  SOLN
15.0000 mL | Freq: Once | INTRAMUSCULAR | Status: AC | PRN
  Administered 2011-05-23: 15 mL via INTRATHECAL

## 2011-05-30 ENCOUNTER — Other Ambulatory Visit: Payer: Self-pay | Admitting: Internal Medicine

## 2011-05-30 ENCOUNTER — Encounter (HOSPITAL_BASED_OUTPATIENT_CLINIC_OR_DEPARTMENT_OTHER): Payer: 59 | Admitting: Internal Medicine

## 2011-05-30 ENCOUNTER — Ambulatory Visit (HOSPITAL_COMMUNITY)
Admission: RE | Admit: 2011-05-30 | Discharge: 2011-05-30 | Disposition: A | Discharge status: Home / Self Care | Payer: 59 | Source: Ambulatory Visit | Attending: Internal Medicine | Admitting: Internal Medicine

## 2011-05-30 DIAGNOSIS — Z09 Encounter for follow-up examination after completed treatment for conditions other than malignant neoplasm: Secondary | ICD-10-CM | POA: Insufficient documentation

## 2011-05-30 DIAGNOSIS — R911 Solitary pulmonary nodule: Secondary | ICD-10-CM | POA: Insufficient documentation

## 2011-05-30 DIAGNOSIS — Z87898 Personal history of other specified conditions: Secondary | ICD-10-CM | POA: Insufficient documentation

## 2011-05-30 DIAGNOSIS — C8588 Other specified types of non-Hodgkin lymphoma, lymph nodes of multiple sites: Secondary | ICD-10-CM

## 2011-05-30 DIAGNOSIS — M5137 Other intervertebral disc degeneration, lumbosacral region: Secondary | ICD-10-CM | POA: Insufficient documentation

## 2011-05-30 DIAGNOSIS — M51379 Other intervertebral disc degeneration, lumbosacral region without mention of lumbar back pain or lower extremity pain: Secondary | ICD-10-CM | POA: Insufficient documentation

## 2011-05-30 DIAGNOSIS — C8590 Non-Hodgkin lymphoma, unspecified, unspecified site: Secondary | ICD-10-CM

## 2011-05-30 LAB — CBC WITH DIFFERENTIAL/PLATELET
BASO%: 0.6 % (ref 0.0–2.0)
EOS%: 4.6 % (ref 0.0–7.0)
HCT: 47.7 % (ref 38.4–49.9)
LYMPH%: 28.8 % (ref 14.0–49.0)
MCH: 30.3 pg (ref 27.2–33.4)
MCHC: 34.5 g/dL (ref 32.0–36.0)
MCV: 87.7 fL (ref 79.3–98.0)
NEUT%: 58.2 % (ref 39.0–75.0)
Platelets: 179 10*3/uL (ref 140–400)

## 2011-05-30 LAB — CMP (CANCER CENTER ONLY)
ALT(SGPT): 26 U/L (ref 10–47)
AST: 22 U/L (ref 11–38)
Creat: 0.8 mg/dl (ref 0.6–1.2)
Total Bilirubin: 0.8 mg/dl (ref 0.20–1.60)

## 2011-05-30 LAB — LACTATE DEHYDROGENASE: LDH: 144 U/L (ref 94–250)

## 2011-05-30 MED ORDER — IOHEXOL 300 MG/ML  SOLN
100.0000 mL | Freq: Once | INTRAMUSCULAR | Status: AC | PRN
  Administered 2011-05-30: 100 mL via INTRAVENOUS

## 2011-06-04 ENCOUNTER — Encounter (HOSPITAL_BASED_OUTPATIENT_CLINIC_OR_DEPARTMENT_OTHER): Payer: 59 | Admitting: Internal Medicine

## 2011-06-04 DIAGNOSIS — C8588 Other specified types of non-Hodgkin lymphoma, lymph nodes of multiple sites: Secondary | ICD-10-CM

## 2011-06-05 ENCOUNTER — Other Ambulatory Visit: Payer: Self-pay | Admitting: Internal Medicine

## 2011-06-05 DIAGNOSIS — C3490 Malignant neoplasm of unspecified part of unspecified bronchus or lung: Secondary | ICD-10-CM

## 2011-09-10 ENCOUNTER — Other Ambulatory Visit: Payer: Self-pay | Admitting: Internal Medicine

## 2011-10-09 ENCOUNTER — Telehealth: Payer: Self-pay | Admitting: Internal Medicine

## 2011-10-09 NOTE — Telephone Encounter (Signed)
Called pt, left message appt on 4/1 lab and Ct and MD visit on 12/02/11

## 2011-10-14 ENCOUNTER — Telehealth: Payer: Self-pay | Admitting: Internal Medicine

## 2011-10-14 NOTE — Telephone Encounter (Signed)
l/m that she had r/s ct and other times needed to be r/s.lab and md r/s and new times left on vm and to call with any prob.   aom

## 2011-11-24 ENCOUNTER — Other Ambulatory Visit: Payer: Self-pay | Admitting: Dermatology

## 2011-12-01 ENCOUNTER — Other Ambulatory Visit: Payer: 59

## 2011-12-01 ENCOUNTER — Other Ambulatory Visit (HOSPITAL_COMMUNITY): Payer: 59

## 2011-12-02 ENCOUNTER — Ambulatory Visit: Payer: 59 | Admitting: Internal Medicine

## 2011-12-08 ENCOUNTER — Other Ambulatory Visit: Payer: 59 | Admitting: Lab

## 2011-12-08 ENCOUNTER — Ambulatory Visit (HOSPITAL_COMMUNITY)
Admission: RE | Admit: 2011-12-08 | Discharge: 2011-12-08 | Disposition: A | Discharge status: Home / Self Care | Payer: 59 | Source: Ambulatory Visit | Attending: Internal Medicine | Admitting: Internal Medicine

## 2011-12-08 DIAGNOSIS — M899 Disorder of bone, unspecified: Secondary | ICD-10-CM | POA: Insufficient documentation

## 2011-12-08 DIAGNOSIS — M949 Disorder of cartilage, unspecified: Secondary | ICD-10-CM | POA: Insufficient documentation

## 2011-12-08 DIAGNOSIS — Z79899 Other long term (current) drug therapy: Secondary | ICD-10-CM | POA: Insufficient documentation

## 2011-12-08 DIAGNOSIS — M4120 Other idiopathic scoliosis, site unspecified: Secondary | ICD-10-CM | POA: Insufficient documentation

## 2011-12-08 DIAGNOSIS — S32009A Unspecified fracture of unspecified lumbar vertebra, initial encounter for closed fracture: Secondary | ICD-10-CM | POA: Insufficient documentation

## 2011-12-08 DIAGNOSIS — C8589 Other specified types of non-Hodgkin lymphoma, extranodal and solid organ sites: Secondary | ICD-10-CM | POA: Insufficient documentation

## 2011-12-08 DIAGNOSIS — C3490 Malignant neoplasm of unspecified part of unspecified bronchus or lung: Secondary | ICD-10-CM

## 2011-12-08 DIAGNOSIS — M519 Unspecified thoracic, thoracolumbar and lumbosacral intervertebral disc disorder: Secondary | ICD-10-CM | POA: Insufficient documentation

## 2011-12-08 DIAGNOSIS — K869 Disease of pancreas, unspecified: Secondary | ICD-10-CM | POA: Insufficient documentation

## 2011-12-08 DIAGNOSIS — K5730 Diverticulosis of large intestine without perforation or abscess without bleeding: Secondary | ICD-10-CM | POA: Insufficient documentation

## 2011-12-08 DIAGNOSIS — X58XXXA Exposure to other specified factors, initial encounter: Secondary | ICD-10-CM | POA: Insufficient documentation

## 2011-12-08 DIAGNOSIS — I2510 Atherosclerotic heart disease of native coronary artery without angina pectoris: Secondary | ICD-10-CM | POA: Insufficient documentation

## 2011-12-08 DIAGNOSIS — I700 Atherosclerosis of aorta: Secondary | ICD-10-CM | POA: Insufficient documentation

## 2011-12-08 LAB — CMP (CANCER CENTER ONLY)
ALT(SGPT): 47 U/L (ref 10–47)
AST: 34 U/L (ref 11–38)
Albumin: 3.8 g/dL (ref 3.3–5.5)
Alkaline Phosphatase: 103 U/L — ABNORMAL HIGH (ref 26–84)
BUN, Bld: 15 mg/dL (ref 7–22)
CO2: 28 meq/L (ref 18–33)
Chloride: 103 meq/L (ref 98–108)
Creat: 0.9 mg/dL (ref 0.6–1.2)
Glucose, Bld: 123 mg/dL — ABNORMAL HIGH (ref 73–118)
Potassium: 4.5 meq/L (ref 3.3–4.7)
Sodium: 143 meq/L (ref 128–145)
Total Bilirubin: 0.8 mg/dL (ref 0.20–1.60)
Total Protein: 6.8 g/dL (ref 6.4–8.1)

## 2011-12-08 LAB — CBC WITH DIFFERENTIAL/PLATELET
Basophils Absolute: 0 10*3/uL (ref 0.0–0.1)
EOS%: 4.6 % (ref 0.0–7.0)
Eosinophils Absolute: 0.2 10*3/uL (ref 0.0–0.5)
HCT: 47.9 % (ref 38.4–49.9)
HGB: 16.6 g/dL (ref 13.0–17.1)
MCH: 29.1 pg (ref 27.2–33.4)
NEUT#: 2 10*3/uL (ref 1.5–6.5)
NEUT%: 44 % (ref 39.0–75.0)
lymph#: 1.9 10*3/uL (ref 0.9–3.3)

## 2011-12-08 LAB — FERRITIN: Ferritin: 250 ng/mL (ref 22–322)

## 2011-12-08 MED ORDER — IOHEXOL 300 MG/ML  SOLN
100.0000 mL | Freq: Once | INTRAMUSCULAR | Status: AC | PRN
  Administered 2011-12-08: 100 mL via INTRAVENOUS

## 2011-12-11 ENCOUNTER — Telehealth: Payer: Self-pay | Admitting: Internal Medicine

## 2011-12-11 ENCOUNTER — Ambulatory Visit (HOSPITAL_BASED_OUTPATIENT_CLINIC_OR_DEPARTMENT_OTHER): Payer: 59 | Admitting: Internal Medicine

## 2011-12-11 VITALS — BP 128/80 | HR 66 | Temp 97.0°F | Ht 72.0 in | Wt 174.6 lb

## 2011-12-11 DIAGNOSIS — C8589 Other specified types of non-Hodgkin lymphoma, extranodal and solid organ sites: Secondary | ICD-10-CM

## 2011-12-11 DIAGNOSIS — C8590 Non-Hodgkin lymphoma, unspecified, unspecified site: Secondary | ICD-10-CM

## 2011-12-11 NOTE — Progress Notes (Signed)
[facility] [phone]   Fax:(336) [phone]  OFFICE PROGRESS NOTE  PRINCIPAL DIAGNOSIS:  Stage III diffuse large B-cell non-Hodgkin lymphoma with extensive lymphadenopathy in the chest and abdomen diagnosed in March 2010.  PRIOR THERAPY:   1. Status post 6 cycle of systemic chemotherapy with CHOP/Rituxan with Neulasta support.  Last dose of chemotherapy was given March 27, 2009 and the patient refused to continue with the last 2 cycles of his treatment. 2. Maintenance Rituxan 375 mg per meter square given every 2 months.  He is status post 3 cycles, again discontinued in July 2011.  CURRENT THERAPY:  Observation.  INTERVAL HISTORY: Edwin Morris 66 y.o. male returns to the clinic today for six-month followup visit accompanied by his wife. The patient has no complaints today. He denied having any significant weight loss or night sweats. He has no chest pain or shortness of breath, no cough or hemoptysis. He has repeat CT scan of the chest, abdomen and pelvis performed recently and he is here today for evaluation and discussion of his scan results.   MEDICAL HISTORY: Past Medical History  Diagnosis Date  . NHL (non-Hodgkin's lymphoma)     nhl dx 10/2008    ALLERGIES:   has no known allergies.  MEDICATIONS:  Current Outpatient Prescriptions  Medication Sig Dispense Refill  . cholecalciferol (VITAMIN D) 1000 UNITS tablet Take 1,000 Units by mouth daily.      Marland Kitchen co-enzyme Q-10 50 MG capsule Take 50 mg by mouth daily.      . finasteride (PROSCAR) 5 MG tablet Take 5 mg by mouth daily.      . fish oil-omega-3 fatty acids 1000 MG capsule Take 2 g by mouth daily.      . pravastatin (PRAVACHOL) 40 MG tablet Take 40 mg by mouth daily.        REVIEW OF SYSTEMS:  A comprehensive review of systems was negative.   PHYSICAL EXAMINATION: General appearance: alert, cooperative and no distress Neck: no adenopathy Lymph nodes: Cervical, supraclavicular, and  axillary nodes normal. Resp: clear to auscultation bilaterally Cardio: regular rate and rhythm, S1, S2 normal, no murmur, click, rub or gallop GI: soft, non-tender; bowel sounds normal; no masses,  no organomegaly Extremities: extremities normal, atraumatic, no cyanosis or edema Neurologic: Alert and oriented X 3, normal strength and tone. Normal symmetric reflexes. Normal coordination and gait  ECOG PERFORMANCE STATUS: 0 - Asymptomatic  Blood pressure 128/80, pulse 66, temperature 97 F (36.1 C), temperature source Oral, height 6' (1.829 m), weight 174 lb 9.6 oz (79.198 kg).  LABORATORY DATA: Lab Results  Component Value Date   WBC 4.6 12/08/2011   HGB 16.6 12/08/2011   HCT 47.9 12/08/2011   MCV 84.0 12/08/2011   PLT 204 12/08/2011      Chemistry      Component Value Date/Time   NA 143 12/08/2011 0826   NA 138 12/04/2010 1038   K 4.5 12/08/2011 0826   K 4.3 12/04/2010 1038   CL 103 12/08/2011 0826   CL 106 12/04/2010 1038   CO2 28 12/08/2011 0826   CO2 24 12/04/2010 1038   BUN 15 12/08/2011 0826   BUN 19 12/04/2010 1038   CREATININE 0.9 12/08/2011 0826   CREATININE 0.81 12/04/2010 1038      Component Value Date/Time   CALCIUM 8.8 05/30/2011 0824   CALCIUM 9.4 12/04/2010 1038   ALKPHOS 103* 12/08/2011 0826   ALKPHOS 81 12/04/2010 1038   AST 34  12/08/2011 0826   AST 15 12/04/2010 1038   ALT 16 12/04/2010 1038   BILITOT 0.80 12/08/2011 0826   BILITOT 0.7 12/04/2010 1038       RADIOGRAPHIC STUDIES: Ct Chest W Contrast  12/08/2011  *RADIOLOGY REPORT*  Clinical Data:  Non-Hodgkins lymphoma.  Ongoing chemotherapy.  CT CHEST, ABDOMEN AND PELVIS WITH CONTRAST  Technique:  Multidetector CT imaging of the chest, abdomen and pelvis was performed following the standard protocol during bolus administration of intravenous contrast.  Contrast: OMNIPAQUE IOHEXOL 300 MG/ML  SOLN  Comparison:  Multiple exams, including 05/30/2011   CT CHEST  Findings:  No pathologic thoracic adenopathy is observed.  Coronary atherosclerotic  calcification noted in the left anterior descending and circumflex coronary artery.  No pleural effusion or significant vascular abnormality is identified.  For the most part the minimal scarring and minimal nodularity in the lungs appear similar to the exam from 07/06/2009.  There is some mildly increased scarring with faint nodularity in the lingula on image 40 of series 5 which is stable compared to the most recent exam of 05/30/2011, but which is increased from the 2010 exam. Nodular component of this process measures 3 mm in diameter on image 40 of series 5.  No new worrisome findings identified.  IMPRESSION:  1.  The 3 mm nodule just anterior to the left major fissure on image 40 of series 5 is stable, but not readily apparent on exams prior to the 11/29/2010 exam.  This may merit some continued observation. 2.  Coronary atherosclerotic calcification.   CT ABDOMEN AND PELVIS  Findings:  Stable benign hypodense lesion in the dome of the right hepatic lobe noted.  The spleen, adrenal glands, and kidneys appear normal.  Cystic lesion in the tail the pancreas measures 2.3 x 1.8 cm. On the coronal multiplanar reconstructed images and appears identical to prior exams.  This lesion appears relatively similar to the PET CT of 11/22/2008, and has not been hypermetabolic on the PET CT evaluations.  The kidneys appear unremarkable, as do the proximal ureters.  No pathologic retroperitoneal or porta hepatis adenopathy is identified.  Dextroconvex lumbar scoliosis is present with rotary component.  The gallbladder and biliary system appear unremarkable.  Atherosclerotic calcification of the aortoiliac tree noted.  Scattered sigmoid diverticula noted.  Prostate gland appears enlarged, measuring 4.9 cm transverse.  The prostate gland indents the bladder base.  Bridging spurring of the sacroiliac joints noted.  There is faintly increased sclerosis in the L1 compression fracture site, possibly indicating a component of  further fracture. However, the overall morphology of the fracture of the compression fracture at L1 appears stable.  There continues to be Schmorl's node with mixed density lesions that to 07/04/2011. Continued mixed density noted at the T5 vertebral body.  IMPRESSION:  1.  No findings of recurrent adenopathy. 2.  Stable cystic lesion in the tail the pancreas, unchanged in 2010, and not hypermetabolic on PET CT.  Differential diagnostic considerations include post inflammatory lesion such as old pseudocyst. Intraductal papillary mucinous tumor is considered less likely given the lack of change.  I do recommend attention to this lesion on any follow-up. 3.  Various bony lesions including a chronic compression fracture L1 with persistent vertebral body sclerosis, and some vertebral body heterogeneity of T5, T11, and T12. I suspect much of this is from successfully treated prior bony lesions.  Original Report Authenticated By: Dellia Cloud, M.D.     ASSESSMENT: This is a very pleasant 66  years old white male with history of his face he diffuse large B-cell non-Hodgkin lymphoma status post systemic chemotherapy with CHOP/Rituxan with almost complete response. The patient has no evidence for disease recurrence but still has questionable nodule in the left major fissure that requires close observation.  PLAN: I discussed the scan results with the patient and his wife. I recommended for him continuous observation for now with repeat CT scan of the chest, abdomen and pelvis in 6 months. He would come back for followup visit at that time. He was advised to call me immediately if she has any concerning symptoms in the interval.  All questions were answered. The patient knows to call the clinic with any problems, questions or concerns. We can certainly see the patient much sooner if necessary.

## 2011-12-11 NOTE — Telephone Encounter (Signed)
Gv pt appt for oct2013.  scheduled pt for ct scan on 10/01 @ [facility]

## 2012-05-19 ENCOUNTER — Telehealth: Payer: Self-pay | Admitting: Internal Medicine

## 2012-05-19 NOTE — Telephone Encounter (Signed)
S.W. pt's wife advised about d.t. change....Marland Kitchensed

## 2012-06-01 ENCOUNTER — Other Ambulatory Visit (HOSPITAL_BASED_OUTPATIENT_CLINIC_OR_DEPARTMENT_OTHER): Payer: 59 | Admitting: Lab

## 2012-06-01 ENCOUNTER — Ambulatory Visit (HOSPITAL_COMMUNITY)
Admission: RE | Admit: 2012-06-01 | Discharge: 2012-06-01 | Disposition: A | Discharge status: Home / Self Care | Payer: 59 | Source: Ambulatory Visit | Attending: Internal Medicine | Admitting: Internal Medicine

## 2012-06-01 DIAGNOSIS — C8590 Non-Hodgkin lymphoma, unspecified, unspecified site: Secondary | ICD-10-CM

## 2012-06-01 DIAGNOSIS — R911 Solitary pulmonary nodule: Secondary | ICD-10-CM | POA: Insufficient documentation

## 2012-06-01 DIAGNOSIS — C8589 Other specified types of non-Hodgkin lymphoma, extranodal and solid organ sites: Secondary | ICD-10-CM

## 2012-06-01 DIAGNOSIS — J984 Other disorders of lung: Secondary | ICD-10-CM | POA: Insufficient documentation

## 2012-06-01 DIAGNOSIS — M948X9 Other specified disorders of cartilage, unspecified sites: Secondary | ICD-10-CM | POA: Insufficient documentation

## 2012-06-01 DIAGNOSIS — K862 Cyst of pancreas: Secondary | ICD-10-CM | POA: Insufficient documentation

## 2012-06-01 DIAGNOSIS — I2510 Atherosclerotic heart disease of native coronary artery without angina pectoris: Secondary | ICD-10-CM | POA: Insufficient documentation

## 2012-06-01 LAB — CBC WITH DIFFERENTIAL/PLATELET
Basophils Absolute: 0 10*3/uL (ref 0.0–0.1)
HCT: 48.6 % (ref 38.4–49.9)
HGB: 16.8 g/dL (ref 13.0–17.1)
MCH: 29.1 pg (ref 27.2–33.4)
MONO#: 0.5 10*3/uL (ref 0.1–0.9)
NEUT%: 39.5 % (ref 39.0–75.0)
WBC: 4.8 10*3/uL (ref 4.0–10.3)
lymph#: 2.1 10*3/uL (ref 0.9–3.3)

## 2012-06-01 LAB — COMPREHENSIVE METABOLIC PANEL (CC13)
AST: 22 U/L (ref 5–34)
BUN: 16 mg/dL (ref 7.0–26.0)
Calcium: 9.4 mg/dL (ref 8.4–10.4)
Chloride: 108 mEq/L — ABNORMAL HIGH (ref 98–107)
Creatinine: 0.8 mg/dL (ref 0.7–1.3)

## 2012-06-01 MED ORDER — IOHEXOL 300 MG/ML  SOLN
100.0000 mL | Freq: Once | INTRAMUSCULAR | Status: AC | PRN
  Administered 2012-06-01: 100 mL via INTRAVENOUS

## 2012-06-03 ENCOUNTER — Ambulatory Visit: Payer: 59 | Admitting: Internal Medicine

## 2012-06-07 ENCOUNTER — Ambulatory Visit (HOSPITAL_BASED_OUTPATIENT_CLINIC_OR_DEPARTMENT_OTHER): Payer: 59 | Admitting: Internal Medicine

## 2012-06-07 VITALS — BP 125/77 | HR 66 | Temp 97.0°F | Resp 20 | Ht 72.0 in | Wt 171.1 lb

## 2012-06-07 DIAGNOSIS — C8590 Non-Hodgkin lymphoma, unspecified, unspecified site: Secondary | ICD-10-CM | POA: Insufficient documentation

## 2012-06-07 DIAGNOSIS — C8588 Other specified types of non-Hodgkin lymphoma, lymph nodes of multiple sites: Secondary | ICD-10-CM

## 2012-06-07 NOTE — Patient Instructions (Signed)
The scan showed no evidence for disease recurrence. Followup in one year with repeat CT scan of the chest, abdomen and pelvis.

## 2012-06-07 NOTE — Progress Notes (Signed)
[facility] Telephone:(336) [phone]   Fax:(336) [phone]  OFFICE PROGRESS NOTE  Aura Dials, MD [address] [place] [address]  PRINCIPAL DIAGNOSIS: Stage III diffuse large B-cell non-Hodgkin lymphoma with extensive lymphadenopathy in the chest and abdomen diagnosed in March 2010.   PRIOR THERAPY:  1. Status post 6 cycle of systemic chemotherapy with CHOP/Rituxan with Neulasta support. Last dose of chemotherapy was given March 27, 2009 and the patient refused to continue with the last 2 cycles of his treatment. 2. Maintenance Rituxan 375 mg per meter square given every 2 months. He is status post 3 cycles, again discontinued in July 2011.  CURRENT THERAPY: Observation.  INTERVAL HISTORY: Breon Allard Lightsey 66 y.o. male returns to the clinic today for six-month followup visit accompanied by his wife. He is doing fine today with no specific complaints. He denied having any significant chest pain, shortness breath, cough or hemoptysis. He denied having any significant weight loss or night sweats. He has no significant abdominal pain and no change in his bowel movement. The patient has repeat CT scan of the chest, abdomen and pelvis performed recently and he is here today for evaluation and discussion of his scan results.  MEDICAL HISTORY: Past Medical History  Diagnosis Date  . NHL (non-Hodgkin's lymphoma)     nhl dx 10/2008    ALLERGIES:   has no known allergies.  MEDICATIONS:  Current Outpatient Prescriptions  Medication Sig Dispense Refill  . cholecalciferol (VITAMIN D) 1000 UNITS tablet Take 1,000 Units by mouth daily.      Marland Kitchen co-enzyme Q-10 50 MG capsule Take 50 mg by mouth daily.      . finasteride (PROSCAR) 5 MG tablet Take 5 mg by mouth daily.      . fish oil-omega-3 fatty acids 1000 MG capsule Take 2 g by mouth daily.      . pravastatin (PRAVACHOL) 40 MG tablet Take 40 mg by mouth daily.        REVIEW OF SYSTEMS:  A comprehensive review of  systems was negative.   PHYSICAL EXAMINATION: General appearance: alert, cooperative and no distress Neck: no adenopathy Lymph nodes: Cervical, supraclavicular, and axillary nodes normal. Resp: clear to auscultation bilaterally Cardio: regular rate and rhythm, S1, S2 normal, no murmur, click, rub or gallop GI: soft, non-tender; bowel sounds normal; no masses,  no organomegaly Extremities: extremities normal, atraumatic, no cyanosis or edema  ECOG PERFORMANCE STATUS: 0 - Asymptomatic  Blood pressure 125/77, pulse 66, temperature 97 F (36.1 C), temperature source Oral, resp. rate 20, height 6' (1.829 m), weight 171 lb 1.6 oz (77.61 kg).  LABORATORY DATA: Lab Results  Component Value Date   WBC 4.8 06/01/2012   HGB 16.8 06/01/2012   HCT 48.6 06/01/2012   MCV 84.4 06/01/2012   PLT 170 06/01/2012      Chemistry      Component Value Date/Time   NA 141 06/01/2012 0847   NA 143 12/08/2011 0826   NA 138 12/04/2010 1038   K 4.1 06/01/2012 0847   K 4.5 12/08/2011 0826   K 4.3 12/04/2010 1038   CL 108* 06/01/2012 0847   CL 103 12/08/2011 0826   CL 106 12/04/2010 1038   CO2 24 06/01/2012 0847   CO2 28 12/08/2011 0826   CO2 24 12/04/2010 1038   BUN 16.0 06/01/2012 0847   BUN 15 12/08/2011 0826   BUN 19 12/04/2010 1038   CREATININE 0.8 06/01/2012 0847   CREATININE 0.9  12/08/2011 0826   CREATININE 0.81 12/04/2010 1038      Component Value Date/Time   CALCIUM 9.4 06/01/2012 0847   CALCIUM 8.8 05/30/2011 0824   CALCIUM 9.4 12/04/2010 1038   ALKPHOS 97 06/01/2012 0847   ALKPHOS 103* 12/08/2011 0826   ALKPHOS 81 12/04/2010 1038   AST 22 06/01/2012 0847   AST 34 12/08/2011 0826   AST 15 12/04/2010 1038   ALT 29 06/01/2012 0847   ALT 16 12/04/2010 1038   BILITOT 1.10 06/01/2012 0847   BILITOT 0.80 12/08/2011 0826   BILITOT 0.7 12/04/2010 1038       RADIOGRAPHIC STUDIES: Ct Chest W Contrast  06/01/2012  *RADIOLOGY REPORT*  Clinical Data:  Non-Hodgkins lymphoma in 2010.  Chemotherapy complete.  Restaging.  CT CHEST, ABDOMEN AND  PELVIS WITH CONTRAST  Technique: Contiguous axial images of the chest abdomen and pelvis were obtained after IV contrast administration.  Contrast: 100  ml Omnipaque-300  Comparison: 12/08/2011   CT CHEST  Findings: Lung windows demonstrate mild ground-glass opacity in the peripheral right upper lobe on image 18 which is new since the prior. Subpleural more inferior right upper lobe linear opacity on image 29 is similar and likely an area of scarring. Similarly, areas of scarring identified within the inferior right middle lobe and medial right lung base/lingula. The nodularity at anterior the left major fissure described on the prior exam is more linear today and likely an area of scarring.  Soft tissue windows demonstrate no supraclavicular adenopathy. No axillary adenopathy.  Tortuous descending thoracic aorta. Normal heart size without pericardial or pleural effusion.  Multivessel coronary artery atherosclerosis.  No central pulmonary embolism, on this non-dedicated study.  No mediastinal or hilar adenopathy.  IMPRESSION:  1. No acute process or evidence of active lymphoma within the chest. 2.  The lingular nodule described on the prior exam is less conspicuous today and likely an area of scarring. 3.  New area of right upper lobe ground-glass opacity. Nonspecific.  Correlate with infectious symptoms. Recommend attention on follow-up.  4. Multivessel coronary artery atherosclerosis.   CT ABDOMEN AND PELVIS  Findings:  A high right hepatic lobe too small to characterize lesion is not significantly changed.  Normal spleen. Underdistended gastric antrum.  Cystic lesion within the pancreatic tail measures 2.1 x 2.0 cm on image 73 versus 2.3 x 1.8 cm on the prior. This suggests stability.  Normal gallbladder, biliary tract, adrenal glands.  Favor early contrast excretion from the upper pole right kidney on image 65.  Normal left kidney.  Aortic atherosclerosis. No retroperitoneal or retrocrural adenopathy.  Normal  colon, appendix, and terminal ileum.  Normal small bowel without abdominal ascites.  Small jejunal mesenteric nodes are similar.  No pelvic adenopathy.  Normal urinary bladder.  Mild prostatomegaly. No significant free fluid.  Partial fusion of the bilateral sacroiliac joints.  Similar moderate compression deformity at the L1 level.  Ill-defined sclerosis at the T11, T12, and T5 levels.  These areas have been present back to 10/12/2009.  IMPRESSION: 1. No acute process or evidence of active lymphoma within the abdomen or pelvis. 2.  Similar areas of sclerosis within the thoracolumbar spine with a moderate L1 compression deformity.  Likely areas of treated lymphoma. 3.  Similar cystic lesion within the pancreatic tail.  Either a pseudocyst or indolent neoplasm such as a intraductal papillary mucinous tumor. Recommend attention on follow-up.   Original Report Authenticated By: Consuello Bossier, M.D.     ASSESSMENT: Ms. and 66 years  old white male with history of stage III diffuse large B-cell non-Hodgkin's lymphoma status post 6 cycles of systemic chemotherapy with CHOP/Rituxan and has been observation since July 2007 was no evidence for disease recurrence.  PLAN: I discussed the scan results with the patient and his wife. I recommended for him to continue on observation for now with repeat CT scan of the chest, abdomen and pelvis in one year.  The patient would come back for followup visit at that time.  He was advised to call me immediately if he has any concerning symptoms in the interval.  All questions were answered. The patient knows to call the clinic with any problems, questions or concerns. We can certainly see the patient much sooner if necessary.

## 2012-06-08 ENCOUNTER — Telehealth: Payer: Self-pay | Admitting: Internal Medicine

## 2012-06-08 NOTE — Telephone Encounter (Signed)
called to give 06/2013 appts,req to mail,mailed     aom

## 2013-06-07 ENCOUNTER — Other Ambulatory Visit (HOSPITAL_BASED_OUTPATIENT_CLINIC_OR_DEPARTMENT_OTHER): Payer: 59

## 2013-06-07 ENCOUNTER — Encounter (HOSPITAL_COMMUNITY): Payer: Self-pay

## 2013-06-07 ENCOUNTER — Ambulatory Visit (HOSPITAL_COMMUNITY)
Admission: RE | Admit: 2013-06-07 | Discharge: 2013-06-07 | Disposition: A | Discharge status: Home / Self Care | Payer: Medicare Other | Source: Ambulatory Visit | Attending: Internal Medicine | Admitting: Internal Medicine

## 2013-06-07 DIAGNOSIS — C8590 Non-Hodgkin lymphoma, unspecified, unspecified site: Secondary | ICD-10-CM

## 2013-06-07 DIAGNOSIS — C8589 Other specified types of non-Hodgkin lymphoma, extranodal and solid organ sites: Secondary | ICD-10-CM

## 2013-06-07 DIAGNOSIS — R918 Other nonspecific abnormal finding of lung field: Secondary | ICD-10-CM | POA: Insufficient documentation

## 2013-06-07 DIAGNOSIS — M899 Disorder of bone, unspecified: Secondary | ICD-10-CM | POA: Insufficient documentation

## 2013-06-07 LAB — CBC WITH DIFFERENTIAL/PLATELET
BASO%: 0.8 % (ref 0.0–2.0)
Basophils Absolute: 0 10*3/uL (ref 0.0–0.1)
EOS%: 3 % (ref 0.0–7.0)
HGB: 17 g/dL (ref 13.0–17.1)
MCH: 28.9 pg (ref 27.2–33.4)
MCHC: 33.7 g/dL (ref 32.0–36.0)
RDW: 13.7 % (ref 11.0–14.6)
WBC: 5.1 10*3/uL (ref 4.0–10.3)
lymph#: 2 10*3/uL (ref 0.9–3.3)

## 2013-06-07 LAB — COMPREHENSIVE METABOLIC PANEL (CC13)
ALT: 23 U/L (ref 0–55)
AST: 16 U/L (ref 5–34)
Albumin: 4.1 g/dL (ref 3.5–5.0)
Calcium: 9.3 mg/dL (ref 8.4–10.4)
Chloride: 108 mEq/L (ref 98–109)
Potassium: 4.4 mEq/L (ref 3.5–5.1)

## 2013-06-07 MED ORDER — IOHEXOL 300 MG/ML  SOLN
100.0000 mL | Freq: Once | INTRAMUSCULAR | Status: AC | PRN
  Administered 2013-06-07: 100 mL via INTRAVENOUS

## 2013-06-09 ENCOUNTER — Ambulatory Visit (HOSPITAL_BASED_OUTPATIENT_CLINIC_OR_DEPARTMENT_OTHER): Payer: 59 | Admitting: Physician Assistant

## 2013-06-09 ENCOUNTER — Telehealth: Payer: Self-pay | Admitting: Internal Medicine

## 2013-06-09 ENCOUNTER — Encounter: Payer: Self-pay | Admitting: Physician Assistant

## 2013-06-09 VITALS — BP 141/76 | HR 62 | Temp 97.8°F | Resp 20 | Ht 72.0 in | Wt 176.1 lb

## 2013-06-09 DIAGNOSIS — C8588 Other specified types of non-Hodgkin lymphoma, lymph nodes of multiple sites: Secondary | ICD-10-CM

## 2013-06-09 DIAGNOSIS — C8590 Non-Hodgkin lymphoma, unspecified, unspecified site: Secondary | ICD-10-CM

## 2013-06-09 NOTE — Progress Notes (Addendum)
[facility] Telephone:([phone]   Fax:([phone]  OFFICE PROGRESS NOTE  Aura Dials, MD [address] [place] [address]  PRINCIPAL DIAGNOSIS: Stage III diffuse large B-cell non-Hodgkin lymphoma with extensive lymphadenopathy in the chest and abdomen diagnosed in March 2010.   PRIOR THERAPY:  1. Status post 6 cycle of systemic chemotherapy with CHOP/Rituxan with Neulasta support. Last dose of chemotherapy was given March 27, 2009 and the patient refused to continue with the last 2 cycles of his treatment. 2. Maintenance Rituxan 375 mg per meter square given every 2 months. He is status post 3 cycles, again discontinued in July 2011.  CURRENT THERAPY: Observation.  INTERVAL HISTORY: Edwin Morris 67 y.o. male returns to the clinic today for six-month followup visit. He is doing fine today with no specific complaints. He denied having any significant chest pain, shortness breath, cough or hemoptysis. He denied having any significant weight loss or night sweats. He has no significant abdominal pain and no change in his bowel movement. He reports he has some nausea after the CT scan, likely related to the oral contrast he had to drink. He states that he ate something and felt better and had no recurrences of nausea did not experience any vomiting at all. He reports that he will retire at the end of this year. The patient has repeat CT scan of the chest, abdomen and pelvis performed recently and he is here today for evaluation and discussion of his scan results.   MEDICAL HISTORY: Past Medical History  Diagnosis Date  . NHL (non-Hodgkin's lymphoma)     nhl dx 10/2008    ALLERGIES:  has No Known Allergies.  MEDICATIONS:  Current Outpatient Prescriptions  Medication Sig Dispense Refill  . cholecalciferol (VITAMIN D) 1000 UNITS tablet Take 1,000 Units by mouth daily.      Marland Kitchen co-enzyme Q-10 50 MG capsule Take 50 mg by mouth daily.      . finasteride  (PROSCAR) 5 MG tablet Take 5 mg by mouth daily.      . fish oil-omega-3 fatty acids 1000 MG capsule Take 2 g by mouth daily.      . pravastatin (PRAVACHOL) 40 MG tablet Take 40 mg by mouth daily.       No current facility-administered medications for this visit.    REVIEW OF SYSTEMS:  A comprehensive review of systems was negative.   PHYSICAL EXAMINATION: General appearance: alert, cooperative and no distress Neck: no adenopathy Lymph nodes: Cervical, supraclavicular, and axillary nodes normal. Resp: clear to auscultation bilaterally Cardio: regular rate and rhythm, S1, S2 normal, no murmur, click, rub or gallop GI: soft, non-tender; bowel sounds normal; no masses,  no organomegaly Extremities: extremities normal, atraumatic, no cyanosis or edema Neurologic: Alert and oriented X 3, normal strength and tone. Normal symmetric reflexes. Normal coordination and gait  ECOG PERFORMANCE STATUS: 0 - Asymptomatic  Blood pressure 141/76, pulse 62, temperature 97.8 F (36.6 C), temperature source Oral, resp. rate 20, height 6' (1.829 m), weight 176 lb 1.6 oz (79.878 kg).  LABORATORY DATA: Lab Results  Component Value Date   WBC 5.1 06/07/2013   HGB 17.0 06/07/2013   HCT 50.5* 06/07/2013   MCV 85.7 06/07/2013   PLT 184 06/07/2013      Chemistry      Component Value Date/Time   NA 141 06/07/2013 0858   NA 143 12/08/2011 0826   NA 138 12/04/2010 1038   K 4.4 06/07/2013 0858  K 4.5 12/08/2011 0826   K 4.3 12/04/2010 1038   CL 108* 06/01/2012 0847   CL 103 12/08/2011 0826   CL 106 12/04/2010 1038   CO2 24 06/07/2013 0858   CO2 28 12/08/2011 0826   CO2 24 12/04/2010 1038   BUN 15.6 06/07/2013 0858   BUN 15 12/08/2011 0826   BUN 19 12/04/2010 1038   CREATININE 0.8 06/07/2013 0858   CREATININE 0.9 12/08/2011 0826   CREATININE 0.81 12/04/2010 1038      Component Value Date/Time   CALCIUM 9.3 06/07/2013 0858   CALCIUM 8.8 05/30/2011 0824   CALCIUM 9.4 12/04/2010 1038   ALKPHOS 99 06/07/2013 0858   ALKPHOS 103*  12/08/2011 0826   ALKPHOS 81 12/04/2010 1038   AST 16 06/07/2013 0858   AST 34 12/08/2011 0826   AST 15 12/04/2010 1038   ALT 23 06/07/2013 0858   ALT 47 12/08/2011 0826   ALT 16 12/04/2010 1038   BILITOT 0.78 06/07/2013 0858   BILITOT 0.80 12/08/2011 0826   BILITOT 0.7 12/04/2010 1038       RADIOGRAPHIC STUDIES: Ct Chest W Contrast  06/07/2013   CLINICAL DATA:  Subsequent treatment strategy for non-Hodgkin's lymphoma  EXAM: CT CHEST, ABDOMEN, AND PELVIS WITH CONTRAST  TECHNIQUE: Multidetector CT imaging of the chest, abdomen and pelvis was performed following the standard protocol during bolus administration of intravenous contrast.  CONTRAST:  OMNIPAQUE IOHEXOL 300 MG/ML  SOLN  COMPARISON:  CT 06/01/2012  FINDINGS: CT CHEST FINDINGS  No axillary or supraclavicular lymphadenopathy. No mediastinal hilar lymphadenopathy. No pericardial fluid. Esophagus is normal.  Review of the lung parenchyma demonstrates no suspicious pulmonary nodules. There are several small subpleural nodules which are unchanged from prior and likely benign.  CT ABDOMEN AND PELVIS FINDINGS  Small sub cm hypodensity in the dome the liver is unchanged. Gallbladder, pancreas, spleen, adrenal glands, and kidneys are normal.  The stomach, small bowel, appendix, cecum normal. The colon and rectosigmoid colon are normal.  The abdominal aorta is normal caliber. No retroperitoneal periportal lymphadenopathy.  No free fluid the pelvis. The prostate gland and bladder normal. No pelvic lymphadenopathy. Sclerotic change within the T12 and L1 vertebral bodies not changed from CT 06/01/2012  IMPRESSION: CT CHEST IMPRESSION  1. No thoracic metastases. 2. Mild subpleural nodularity appears benign.  CT ABDOMEN AND PELVIS IMPRESSION  1. No abdominal or pelvic lymphadenopathy. Normal spleen 2. Sclerotic lesions at T12 and L1 are likely benign.   Electronically Signed   By: Genevive Bi M.D.   On: 06/07/2013 10:23   Ct Abdomen Pelvis W  Contrast  06/07/2013   CLINICAL DATA:  Subsequent treatment strategy for non-Hodgkin's lymphoma  EXAM: CT CHEST, ABDOMEN, AND PELVIS WITH CONTRAST  TECHNIQUE: Multidetector CT imaging of the chest, abdomen and pelvis was performed following the standard protocol during bolus administration of intravenous contrast.  CONTRAST:  OMNIPAQUE IOHEXOL 300 MG/ML  SOLN  COMPARISON:  CT 06/01/2012  FINDINGS: CT CHEST FINDINGS  No axillary or supraclavicular lymphadenopathy. No mediastinal hilar lymphadenopathy. No pericardial fluid. Esophagus is normal.  Review of the lung parenchyma demonstrates no suspicious pulmonary nodules. There are several small subpleural nodules which are unchanged from prior and likely benign.  CT ABDOMEN AND PELVIS FINDINGS  Small sub cm hypodensity in the dome the liver is unchanged. Gallbladder, pancreas, spleen, adrenal glands, and kidneys are normal.  The stomach, small bowel, appendix, cecum normal. The colon and rectosigmoid colon are normal.  The abdominal aorta is normal  caliber. No retroperitoneal periportal lymphadenopathy.  No free fluid the pelvis. The prostate gland and bladder normal. No pelvic lymphadenopathy. Sclerotic change within the T12 and L1 vertebral bodies not changed from CT 06/01/2012  IMPRESSION: CT CHEST IMPRESSION  1. No thoracic metastases. 2. Mild subpleural nodularity appears benign.  CT ABDOMEN AND PELVIS IMPRESSION  1. No abdominal or pelvic lymphadenopathy. Normal spleen 2. Sclerotic lesions at T12 and L1 are likely benign.   Electronically Signed   By: Genevive Bi M.D.   On: 06/07/2013 10:23    ASSESSMENT/PLAN: Ms. and 67 years old white male with history of stage III diffuse large B-cell non-Hodgkin's lymphoma status post 6 cycles of systemic chemotherapy with CHOP/Rituxan and has been observation since July 2007 was no evidence for disease recurrence. His CT scan was negative for any thoracic metastatic disease, there is mild subpleural nodularity  that appears benign. There is no abdominal or pelvic lymphadenopathy and the spleen was normal. There were sclerotic lesions at T12 and L1 was felt likely to be benign. Patient was discussed with an also seen by Dr. Arbutus Ped. He will continue on observation for his history of stage III diffuse large B-cell non-Hodgkin lymphoma the presenting with extensive lymphadenopathy in the chest and the abdomen originally diagnosed in March of 2010. He'll followup with Dr. Arbutus Ped in one year with a repeat CBC differential, C. met LDH and CT of the chest, abdomen and pelvis with contrast to reevaluate his disease.  Malayzia Laforte E, PA-C   He was advised to call me immediately if he has any concerning symptoms in the interval.  All questions were answered. The patient knows to call the clinic with any problems, questions or concerns. We can certainly see the patient much sooner if necessary.  ADDENDUM: Hematology/Oncology Attending: I had face to face encounter with the patient. I recommended his care plan. The patient is here today for his annual followup visit. He is feeling fine with no specific complaints. He has no evidence for disease recurrence of the diffuse large B-cell non-Hodgkin lymphoma on the recent scan. I recommended for the patient to continue on observation with repeat CT scan of the chest, abdomen and pelvis as well as blood work in one year. He was advised to call immediately if he has any concerning symptoms in the interval. Lajuana Matte., MD 06/11/2013

## 2013-06-09 NOTE — Patient Instructions (Signed)
Follow up in 1 year with Ct scan of your chest, abdomen and pelvis to re-evaluate your disease

## 2013-06-09 NOTE — Telephone Encounter (Signed)
gv and printed appt sched and avs for pt for OCT 2015...gv pt Barium

## 2014-06-05 ENCOUNTER — Encounter (HOSPITAL_COMMUNITY): Payer: Self-pay

## 2014-06-05 ENCOUNTER — Other Ambulatory Visit (HOSPITAL_BASED_OUTPATIENT_CLINIC_OR_DEPARTMENT_OTHER): Payer: 59

## 2014-06-05 ENCOUNTER — Ambulatory Visit (HOSPITAL_COMMUNITY)
Admission: RE | Admit: 2014-06-05 | Discharge: 2014-06-05 | Disposition: A | Discharge status: Home / Self Care | Payer: Medicare Other | Source: Ambulatory Visit | Attending: Physician Assistant | Admitting: Physician Assistant

## 2014-06-05 DIAGNOSIS — Z8572 Personal history of non-Hodgkin lymphomas: Secondary | ICD-10-CM

## 2014-06-05 DIAGNOSIS — C8590 Non-Hodgkin lymphoma, unspecified, unspecified site: Secondary | ICD-10-CM | POA: Insufficient documentation

## 2014-06-05 LAB — CBC WITH DIFFERENTIAL/PLATELET
BASO%: 0.7 % (ref 0.0–2.0)
BASOS ABS: 0 10*3/uL (ref 0.0–0.1)
EOS ABS: 0.2 10*3/uL (ref 0.0–0.5)
EOS%: 4.6 % (ref 0.0–7.0)
HCT: 50.3 % — ABNORMAL HIGH (ref 38.4–49.9)
HEMOGLOBIN: 16.4 g/dL (ref 13.0–17.1)
LYMPH%: 36.8 % (ref 14.0–49.0)
MCH: 28.4 pg (ref 27.2–33.4)
MCHC: 32.7 g/dL (ref 32.0–36.0)
MCV: 86.9 fL (ref 79.3–98.0)
MONO#: 0.5 10*3/uL (ref 0.1–0.9)
MONO%: 10.2 % (ref 0.0–14.0)
NEUT%: 47.7 % (ref 39.0–75.0)
NEUTROS ABS: 2.5 10*3/uL (ref 1.5–6.5)
Platelets: 208 10*3/uL (ref 140–400)
RBC: 5.79 10*6/uL (ref 4.20–5.82)
RDW: 13.7 % (ref 11.0–14.6)
WBC: 5.2 10*3/uL (ref 4.0–10.3)
lymph#: 1.9 10*3/uL (ref 0.9–3.3)

## 2014-06-05 LAB — COMPREHENSIVE METABOLIC PANEL (CC13)
ALBUMIN: 4 g/dL (ref 3.5–5.0)
ALT: 22 U/L (ref 0–55)
ANION GAP: 8 meq/L (ref 3–11)
AST: 14 U/L (ref 5–34)
Alkaline Phosphatase: 92 U/L (ref 40–150)
BUN: 17.6 mg/dL (ref 7.0–26.0)
CALCIUM: 9.3 mg/dL (ref 8.4–10.4)
CHLORIDE: 107 meq/L (ref 98–109)
CO2: 26 meq/L (ref 22–29)
Creatinine: 0.8 mg/dL (ref 0.7–1.3)
GLUCOSE: 111 mg/dL (ref 70–140)
POTASSIUM: 4.3 meq/L (ref 3.5–5.1)
Sodium: 140 mEq/L (ref 136–145)
Total Bilirubin: 0.75 mg/dL (ref 0.20–1.20)
Total Protein: 6.4 g/dL (ref 6.4–8.3)

## 2014-06-05 LAB — LACTATE DEHYDROGENASE (CC13): LDH: 172 U/L (ref 125–245)

## 2014-06-05 MED ORDER — IOHEXOL 300 MG/ML  SOLN
100.0000 mL | Freq: Once | INTRAMUSCULAR | Status: AC | PRN
  Administered 2014-06-05: 100 mL via INTRAVENOUS

## 2014-06-07 ENCOUNTER — Ambulatory Visit (HOSPITAL_BASED_OUTPATIENT_CLINIC_OR_DEPARTMENT_OTHER): Payer: 59 | Admitting: Internal Medicine

## 2014-06-07 ENCOUNTER — Encounter: Payer: Self-pay | Admitting: Internal Medicine

## 2014-06-07 ENCOUNTER — Telehealth: Payer: Self-pay | Admitting: Internal Medicine

## 2014-06-07 VITALS — BP 145/73 | HR 71 | Temp 98.1°F | Resp 18 | Ht 72.0 in | Wt 171.8 lb

## 2014-06-07 DIAGNOSIS — C8590 Non-Hodgkin lymphoma, unspecified, unspecified site: Secondary | ICD-10-CM

## 2014-06-07 DIAGNOSIS — Z8572 Personal history of non-Hodgkin lymphomas: Secondary | ICD-10-CM

## 2014-06-07 NOTE — Progress Notes (Signed)
[place] Telephone:([phone]   Fax:(336) [phone]  OFFICE PROGRESS NOTE  [name], MD [address] [place] [address]  PRINCIPAL DIAGNOSIS: Stage III diffuse large B-cell non-Hodgkin lymphoma with extensive lymphadenopathy in the chest and abdomen diagnosed in March 2010.   PRIOR THERAPY:  1. Status post 6 cycle of systemic chemotherapy with CHOP/Rituxan with Neulasta support. Last dose of chemotherapy was given March 27, 2009 and the patient refused to continue with the last 2 cycles of his treatment. 2. Maintenance Rituxan 375 mg per meter square given every 2 months. He is status post 3 cycles, again discontinued in July 2011.  CURRENT THERAPY: Observation.  INTERVAL HISTORY: Edwin Morris 68 y.o. male returns to the clinic today for followup visit accompanied by his wife. He has been enjoying his retirement. He is doing fine today with no specific complaints. He denied having any significant chest pain, shortness of breath, cough or hemoptysis. He denied having any significant weight loss or night sweats. He has no significant abdominal pain and no change in his bowel movement. The patient has repeat CT scan of the chest, abdomen and pelvis performed recently and he is here today for evaluation and discussion of his scan results.  MEDICAL HISTORY: Past Medical History  Diagnosis Date  . NHL (non-Hodgkin's lymphoma)     nhl dx 10/2008    ALLERGIES:  has No Known Allergies.  MEDICATIONS:  Current Outpatient Prescriptions  Medication Sig Dispense Refill  . cholecalciferol (VITAMIN D) 1000 UNITS tablet Take 1,000 Units by mouth daily.      Marland Kitchen co-enzyme Q-10 50 MG capsule Take 50 mg by mouth daily.      . finasteride (PROSCAR) 5 MG tablet Take 5 mg by mouth daily.      . fish oil-omega-3 fatty acids 1000 MG capsule Take 2 g by mouth daily.      . pravastatin (PRAVACHOL) 40 MG tablet Take 40 mg by mouth daily.       No current  facility-administered medications for this visit.    REVIEW OF SYSTEMS:  A comprehensive review of systems was negative.   PHYSICAL EXAMINATION: General appearance: alert, cooperative and no distress Neck: no adenopathy Lymph nodes: Cervical, supraclavicular, and axillary nodes normal. Resp: clear to auscultation bilaterally Cardio: regular rate and rhythm, S1, S2 normal, no murmur, click, rub or gallop GI: soft, non-tender; bowel sounds normal; no masses,  no organomegaly Extremities: extremities normal, atraumatic, no cyanosis or edema  ECOG PERFORMANCE STATUS: 0 - Asymptomatic  Blood pressure 145/73, pulse 71, temperature 98.1 F (36.7 C), temperature source Oral, resp. rate 18, height 6' (1.829 m), weight 171 lb 12.8 oz (77.928 kg), SpO2 100.00%.  LABORATORY DATA: Lab Results  Component Value Date   WBC 5.2 06/05/2014   HGB 16.4 06/05/2014   HCT 50.3* 06/05/2014   MCV 86.9 06/05/2014   PLT 208 06/05/2014      Chemistry      Component Value Date/Time   NA 140 06/05/2014 0842   NA 143 12/08/2011 0826   NA 138 12/04/2010 1038   K 4.3 06/05/2014 0842   K 4.5 12/08/2011 0826   K 4.3 12/04/2010 1038   CL 108* 06/01/2012 0847   CL 103 12/08/2011 0826   CL 106 12/04/2010 1038   CO2 26 06/05/2014 0842   CO2 28 12/08/2011 0826   CO2 24 12/04/2010 1038   BUN 17.6 06/05/2014 0842   BUN 15 12/08/2011 0826  BUN 19 12/04/2010 1038   CREATININE 0.8 06/05/2014 0842   CREATININE 0.9 12/08/2011 0826   CREATININE 0.81 12/04/2010 1038      Component Value Date/Time   CALCIUM 9.3 06/05/2014 0842   CALCIUM 8.8 05/30/2011 0824   CALCIUM 9.4 12/04/2010 1038   ALKPHOS 92 06/05/2014 0842   ALKPHOS 103* 12/08/2011 0826   ALKPHOS 81 12/04/2010 1038   AST 14 06/05/2014 0842   AST 34 12/08/2011 0826   AST 15 12/04/2010 1038   ALT 22 06/05/2014 0842   ALT 47 12/08/2011 0826   ALT 16 12/04/2010 1038   BILITOT 0.75 06/05/2014 0842   BILITOT 0.80 12/08/2011 0826   BILITOT 0.7 12/04/2010 1038       RADIOGRAPHIC STUDIES: Ct Chest W  Contrast  06/05/2014   CLINICAL DATA:  Subsequent encounter for non-Hodgkin's lymphoma  EXAM: CT CHEST, ABDOMEN, AND PELVIS WITH CONTRAST  TECHNIQUE: Multidetector CT imaging of the chest, abdomen and pelvis was performed following the standard protocol during bolus administration of intravenous contrast.  CONTRAST:  190mL OMNIPAQUE IOHEXOL 300 MG/ML  SOLN  COMPARISON:  06/07/2013  FINDINGS: CT CHEST FINDINGS  Soft tissue / Mediastinum: No axillary lymphadenopathy. No mediastinal lymphadenopathy. 11 mm right hilar lymph node is unchanged in the interval. No left hilar lymphadenopathy. The heart size is normal. Coronary artery calcification is noted. No pericardial effusion  Lungs / Pleura: Stable right upper lobe scarring there is some scarring in the posteromedial right lower lobe as well, unchanged. Tiny left upper lobe nodule on image 20 is a cyst within other tiny left upper lobe nodule seen on image 25. These are both stable and likely benign. There is some scarring in the lingula which is unchanged. No new pulmonary nodular mass. No focal airspace consolidation or pulmonary edema. No pleural effusion.  Bones: Bone windows reveal no worrisome lytic or sclerotic osseous lesions.  CT ABDOMEN AND PELVIS FINDINGS  Hepatobiliary: 6 mm low-density lesion in the right liver is stable. There is no evidence for gallstones, gallbladder wall thickening, or pericholecystic fluid. No intrahepatic or extrahepatic biliary dilation.  Pancreas: 2.1 cm cystic lesion in the tail the pancreas is not changed substantially since 05/03/2010 when it measured 2.0 cm in the same dimension, consistent with a benign process. No dilatation of the main pancreatic duct.  Spleen: No splenomegaly. No focal mass lesion.  Adrenals/Urinary Tract: No adrenal nodule or mass. Kidneys are unremarkable. No ureteral stones or dilatation. Bladder is unremarkable.  Stomach/Bowel: Stomach is nondistended. No gastric wall thickening. No evidence of outlet  obstruction. Duodenum is normally positioned as is the ligament of Treitz. No small bowel wall thickening. No small bowel dilatation. Terminal ileum is normal. The appendix is not visualized, but there is no edema or inflammation in the region of the cecum. Small lymph nodes in the ileocolic mesentery are stable.  Vascular/Lymphatic: Atherosclerotic calcification is noted in the wall of the abdominal aorta without aneurysm. 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 The portal vein and superior mesenteric vein are patent. No retroperitoneal lymphadenopathy in the abdomen. No gastrohepatic or hepatoduodenal ligament lymphadenopathy. No pelvic sidewall lymphadenopathy in the pelvis.  Reproductive: Prostate gland is enlarged.  Other: No intraperitoneal free fluid. No evidence for abdominal wall hernia.  Musculoskeletal: Bone windows reveal no worrisome lytic or sclerotic osseous lesions. Slight compression deformity of the L1 vertebral body is stable.  IMPRESSION: 1. Stable exam. No new or progressive disease. No lymphadenopathy in the chest, abdomen, or  pelvis. 2. 2.1 cm cystic lesion in the tail the pancreas without substantial change in 4 years. This is most consistent with a benign process. 3. 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 4. Stable tiny hypodensity in the liver, likely a cyst. 5. Stable appearance of sclerotic lesions in T11 and T12 with mild compression deformity of the L1 vertebral body. 6. Prostatomegaly.   Electronically Signed   By: Misty Stanley M.D.   On: 06/05/2014 11:19   Ct Abdomen Pelvis W Contrast  06/05/2014   CLINICAL DATA:  Subsequent encounter for non-Hodgkin's lymphoma  EXAM: CT CHEST, ABDOMEN, AND PELVIS WITH CONTRAST  TECHNIQUE: Multidetector CT imaging of the chest, abdomen and pelvis was performed following the standard protocol during bolus administration of intravenous contrast.  CONTRAST:  168mL OMNIPAQUE IOHEXOL 300 MG/ML  SOLN  COMPARISON:   06/07/2013  FINDINGS: CT CHEST FINDINGS  Soft tissue / Mediastinum: No axillary lymphadenopathy. No mediastinal lymphadenopathy. 11 mm right hilar lymph node is unchanged in the interval. No left hilar lymphadenopathy. The heart size is normal. Coronary artery calcification is noted. No pericardial effusion  Lungs / Pleura: Stable right upper lobe scarring there is some scarring in the posteromedial right lower lobe as well, unchanged. Tiny left upper lobe nodule on image 20 is a cyst within other tiny left upper lobe nodule seen on image 25. These are both stable and likely benign. There is some scarring in the lingula which is unchanged. No new pulmonary nodular mass. No focal airspace consolidation or pulmonary edema. No pleural effusion.  Bones: Bone windows reveal no worrisome lytic or sclerotic osseous lesions.  CT ABDOMEN AND PELVIS FINDINGS  Hepatobiliary: 6 mm low-density lesion in the right liver is stable. There is no evidence for gallstones, gallbladder wall thickening, or pericholecystic fluid. No intrahepatic or extrahepatic biliary dilation.  Pancreas: 2.1 cm cystic lesion in the tail the pancreas is not changed substantially since 05/03/2010 when it measured 2.0 cm in the same dimension, consistent with a benign process. No dilatation of the main pancreatic duct.  Spleen: No splenomegaly. No focal mass lesion.  Adrenals/Urinary Tract: No adrenal nodule or mass. Kidneys are unremarkable. No ureteral stones or dilatation. Bladder is unremarkable.  Stomach/Bowel: Stomach is nondistended. No gastric wall thickening. No evidence of outlet obstruction. Duodenum is normally positioned as is the ligament of Treitz. No small bowel wall thickening. No small bowel dilatation. Terminal ileum is normal. The appendix is not visualized, but there is no edema or inflammation in the region of the cecum. Small lymph nodes in the ileocolic mesentery are stable.  Vascular/Lymphatic: Atherosclerotic calcification is  noted in the wall of the abdominal aorta without aneurysm. 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 The portal vein and superior mesenteric vein are patent. No retroperitoneal lymphadenopathy in the abdomen. No gastrohepatic or hepatoduodenal ligament lymphadenopathy. No pelvic sidewall lymphadenopathy in the pelvis.  Reproductive: Prostate gland is enlarged.  Other: No intraperitoneal free fluid. No evidence for abdominal wall hernia.  Musculoskeletal: Bone windows reveal no worrisome lytic or sclerotic osseous lesions. Slight compression deformity of the L1 vertebral body is stable.  IMPRESSION: 1. Stable exam. No new or progressive disease. No lymphadenopathy in the chest, abdomen, or pelvis. 2. 2.1 cm cystic lesion in the tail the pancreas without substantial change in 4 years. This is most consistent with a benign process. 3. 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 4. Stable tiny hypodensity in the liver, likely  a cyst. 5. Stable appearance of sclerotic lesions in T11 and T12 with mild compression deformity of the L1 vertebral body. 6. Prostatomegaly.   Electronically Signed   By: Misty Stanley M.D.   On: 06/05/2014 11:19   ASSESSMENT AND PLAN Ms. and 68 years old white male with history of stage III diffuse large B-cell non-Hodgkin's lymphoma status post 6 cycles of systemic chemotherapy with CHOP/Rituxan and has been observation since July 2007 with no evidence for disease recurrence. I discussed the scan results with the patient and his wife. I recommended for him to continue on observation for now with repeat CBC, comprehensive metabolic panel and LDH in one year.  The patient would come back for followup visit at that time.  He was advised to call me immediately if he has any concerning symptoms in the interval.  All questions were answered. The patient knows to call the clinic with any problems, questions or concerns. We can certainly see the patient much  sooner if necessary.  Disclaimer: This note was dictated with voice recognition software. Similar sounding words can inadvertently be transcribed and may be missed upon review.

## 2014-06-07 NOTE — Telephone Encounter (Signed)
Pt confirmed labs/ov per 10/07 POF, gave pt AVS.... KJ

## 2015-03-06 ENCOUNTER — Telehealth: Payer: Self-pay | Admitting: Internal Medicine

## 2015-03-06 NOTE — Telephone Encounter (Signed)
pt called to r/s appt due to going outof town...done...pt aware of new d.t °

## 2015-06-06 ENCOUNTER — Other Ambulatory Visit: Payer: 59

## 2015-06-06 ENCOUNTER — Ambulatory Visit: Payer: 59 | Admitting: Internal Medicine

## 2015-06-18 ENCOUNTER — Other Ambulatory Visit: Payer: Self-pay | Admitting: Medical Oncology

## 2015-06-18 ENCOUNTER — Encounter: Payer: Self-pay | Admitting: Internal Medicine

## 2015-06-18 ENCOUNTER — Ambulatory Visit (HOSPITAL_BASED_OUTPATIENT_CLINIC_OR_DEPARTMENT_OTHER): Payer: Medicare Other | Admitting: Internal Medicine

## 2015-06-18 ENCOUNTER — Other Ambulatory Visit (HOSPITAL_BASED_OUTPATIENT_CLINIC_OR_DEPARTMENT_OTHER): Payer: Medicare Other

## 2015-06-18 VITALS — BP 141/82 | HR 61 | Temp 98.2°F | Resp 18 | Ht 72.0 in | Wt 169.9 lb

## 2015-06-18 DIAGNOSIS — C8333 Diffuse large B-cell lymphoma, intra-abdominal lymph nodes: Secondary | ICD-10-CM

## 2015-06-18 DIAGNOSIS — Z8572 Personal history of non-Hodgkin lymphomas: Secondary | ICD-10-CM

## 2015-06-18 DIAGNOSIS — C8590 Non-Hodgkin lymphoma, unspecified, unspecified site: Secondary | ICD-10-CM

## 2015-06-18 LAB — CBC WITH DIFFERENTIAL/PLATELET
BASO%: 0.9 % (ref 0.0–2.0)
Basophils Absolute: 0.1 10*3/uL (ref 0.0–0.1)
EOS ABS: 0.3 10*3/uL (ref 0.0–0.5)
EOS%: 5.6 % (ref 0.0–7.0)
HCT: 48.3 % (ref 38.4–49.9)
HEMOGLOBIN: 16 g/dL (ref 13.0–17.1)
LYMPH%: 36.2 % (ref 14.0–49.0)
MCH: 28.6 pg (ref 27.2–33.4)
MCHC: 33 g/dL (ref 32.0–36.0)
MCV: 86.4 fL (ref 79.3–98.0)
MONO#: 0.5 10*3/uL (ref 0.1–0.9)
MONO%: 9 % (ref 0.0–14.0)
NEUT%: 48.3 % (ref 39.0–75.0)
NEUTROS ABS: 2.8 10*3/uL (ref 1.5–6.5)
Platelets: 184 10*3/uL (ref 140–400)
RBC: 5.59 10*6/uL (ref 4.20–5.82)
RDW: 13.7 % (ref 11.0–14.6)
WBC: 5.9 10*3/uL (ref 4.0–10.3)
lymph#: 2.1 10*3/uL (ref 0.9–3.3)

## 2015-06-18 LAB — COMPREHENSIVE METABOLIC PANEL (CC13)
ALBUMIN: 4 g/dL (ref 3.5–5.0)
ALT: 22 U/L (ref 0–55)
AST: 16 U/L (ref 5–34)
Alkaline Phosphatase: 96 U/L (ref 40–150)
Anion Gap: 8 mEq/L (ref 3–11)
BILIRUBIN TOTAL: 0.61 mg/dL (ref 0.20–1.20)
BUN: 16.7 mg/dL (ref 7.0–26.0)
CO2: 23 mEq/L (ref 22–29)
Calcium: 9.1 mg/dL (ref 8.4–10.4)
Chloride: 110 mEq/L — ABNORMAL HIGH (ref 98–109)
Creatinine: 0.8 mg/dL (ref 0.7–1.3)
EGFR: >90 mL/min/{1.73_m2} (ref >90)
GLUCOSE: 114 mg/dL (ref 70–140)
Potassium: 4.5 mEq/L (ref 3.5–5.1)
SODIUM: 141 meq/L (ref 136–145)
TOTAL PROTEIN: 6.1 g/dL — AB (ref 6.4–8.3)

## 2015-06-18 LAB — LACTATE DEHYDROGENASE (CC13): LDH: 153 U/L (ref 125–245)

## 2015-06-18 NOTE — Progress Notes (Signed)
[place] [phone]) [phone]   Fax:(336) [phone]  OFFICE PROGRESS NOTE  Edwin Inches, MD [address] [place] [address]  PRINCIPAL DIAGNOSIS: Stage III diffuse large B-cell non-Hodgkin lymphoma with extensive lymphadenopathy in the chest and abdomen diagnosed in March 2010.   PRIOR THERAPY:  1. Status post 6 cycle of systemic chemotherapy with CHOP/Rituxan with Neulasta support. Last dose of chemotherapy was given March 27, 2009 and the patient refused to continue with the last 2 cycles of his treatment. 2. Maintenance Rituxan 375 mg per meter square given every 2 months. He is status post 3 cycles, again discontinued in July 2011.  CURRENT THERAPY: Observation.  INTERVAL HISTORY: Edwin Morris 69 y.o. male returns to the clinic today for annual followup visit accompanied by his wife. The patient is doing fine today with no specific complaints. He denied having any significant chest pain, shortness of breath, cough or hemoptysis. He denied having any significant weight loss or night sweats. He has no significant abdominal pain and no change in his bowel movement. He has no palpable lymphadenopathy. The patient has repeat CBC, comprehensive metabolic panel and LDH performed earlier today and he is here today for evaluation and discussion of his lab results.  MEDICAL HISTORY: Past Medical History  Diagnosis Date  . NHL (non-Hodgkin's lymphoma) ([place])     nhl dx 10/2008    ALLERGIES:  has No Known Allergies.  MEDICATIONS:  Current Outpatient Prescriptions  Medication Sig Dispense Refill  . cholecalciferol (VITAMIN D) 1000 UNITS tablet Take 1,000 Units by mouth daily.    Marland Kitchen co-enzyme Q-10 50 MG capsule Take 50 mg by mouth daily.    . Cyanocobalamin (RA VITAMIN B-12 TR) 1000 MCG TBCR Take 1,000 mcg by mouth daily.    . finasteride (PROSCAR) 5 MG tablet Take 5 mg by mouth daily.    . fish oil-omega-3 fatty acids  1000 MG capsule Take 2 g by mouth daily.    . pravastatin (PRAVACHOL) 40 MG tablet Take 40 mg by mouth daily.     No current facility-administered medications for this visit.    REVIEW OF SYSTEMS:  A comprehensive review of systems was negative.   PHYSICAL EXAMINATION: General appearance: alert, cooperative and no distress Neck: no adenopathy Lymph nodes: Cervical, supraclavicular, and axillary nodes normal. Resp: clear to auscultation bilaterally Cardio: regular rate and rhythm, S1, S2 normal, no murmur, click, rub or gallop GI: soft, non-tender; bowel sounds normal; no masses,  no organomegaly Extremities: extremities normal, atraumatic, no cyanosis or edema  ECOG PERFORMANCE STATUS: 0 - Asymptomatic  Blood pressure 141/82, pulse 61, temperature 98.2 F (36.8 C), temperature source Oral, resp. rate 18, height 6' (1.829 m), weight 169 lb 14.4 oz (77.066 kg).  LABORATORY DATA: Lab Results  Component Value Date   WBC 5.9 06/18/2015   HGB 16.0 06/18/2015   HCT 48.3 06/18/2015   MCV 86.4 06/18/2015   PLT 184 06/18/2015      Chemistry      Component Value Date/Time   NA 140 06/05/2014 0842   NA 143 12/08/2011 0826   NA 138 12/04/2010 1038   K 4.3 06/05/2014 0842   K 4.5 12/08/2011 0826   K 4.3 12/04/2010 1038   CL 108* 06/01/2012 0847   CL 103 12/08/2011 0826   CL 106 12/04/2010 1038   CO2 26 06/05/2014 0842   CO2 28 12/08/2011 0826   CO2 24 12/04/2010  1038   BUN 17.6 06/05/2014 0842   BUN 15 12/08/2011 0826   BUN 19 12/04/2010 1038   CREATININE 0.8 06/05/2014 0842   CREATININE 0.9 12/08/2011 0826   CREATININE 0.81 12/04/2010 1038      Component Value Date/Time   CALCIUM 9.3 06/05/2014 0842   CALCIUM 8.8 05/30/2011 0824   CALCIUM 9.4 12/04/2010 1038   ALKPHOS 92 06/05/2014 0842   ALKPHOS 103* 12/08/2011 0826   ALKPHOS 81 12/04/2010 1038   AST 14 06/05/2014 0842   AST 34 12/08/2011 0826   AST 15 12/04/2010 1038   ALT 22 06/05/2014 0842   ALT 47 12/08/2011  0826   ALT 16 12/04/2010 1038   BILITOT 0.75 06/05/2014 0842   BILITOT 0.80 12/08/2011 0826   BILITOT 0.7 12/04/2010 1038       RADIOGRAPHIC STUDIES:  ASSESSMENT AND PLAN Ms. and 69 years old white male with history of stage III diffuse large B-cell non-Hodgkin's lymphoma status post 7 cycles of systemic chemotherapy with CHOP/Rituxan and has been observation since July 2007 with no evidence for disease recurrence. I discussed the lab results with the patient and his wife. I recommended for him to continue on observation with routine follow-up visit with his primary care physician.  I will see the patient on as-needed basis at this point. He was advised to call me immediately if he has any concerning symptoms.  All questions were answered. The patient knows to call the clinic with any problems, questions or concerns. We can certainly see the patient much sooner if necessary.  Disclaimer: This note was dictated with voice recognition software. Similar sounding words can inadvertently be transcribed and may be missed upon review.

## 2015-10-22 DIAGNOSIS — N5201 Erectile dysfunction due to arterial insufficiency: Secondary | ICD-10-CM | POA: Diagnosis not present

## 2015-10-22 DIAGNOSIS — R972 Elevated prostate specific antigen [PSA]: Secondary | ICD-10-CM | POA: Diagnosis not present

## 2015-11-22 DIAGNOSIS — M67911 Unspecified disorder of synovium and tendon, right shoulder: Secondary | ICD-10-CM | POA: Diagnosis not present

## 2015-11-29 DIAGNOSIS — L814 Other melanin hyperpigmentation: Secondary | ICD-10-CM | POA: Diagnosis not present

## 2015-11-29 DIAGNOSIS — L821 Other seborrheic keratosis: Secondary | ICD-10-CM | POA: Diagnosis not present

## 2015-11-29 DIAGNOSIS — D225 Melanocytic nevi of trunk: Secondary | ICD-10-CM | POA: Diagnosis not present

## 2015-11-29 DIAGNOSIS — D2271 Melanocytic nevi of right lower limb, including hip: Secondary | ICD-10-CM | POA: Diagnosis not present

## 2015-11-29 DIAGNOSIS — L570 Actinic keratosis: Secondary | ICD-10-CM | POA: Diagnosis not present

## 2016-04-21 DIAGNOSIS — Z1159 Encounter for screening for other viral diseases: Secondary | ICD-10-CM | POA: Diagnosis not present

## 2016-04-21 DIAGNOSIS — Z0000 Encounter for general adult medical examination without abnormal findings: Secondary | ICD-10-CM | POA: Diagnosis not present

## 2016-04-24 DIAGNOSIS — Z23 Encounter for immunization: Secondary | ICD-10-CM | POA: Diagnosis not present

## 2016-04-24 DIAGNOSIS — Z0000 Encounter for general adult medical examination without abnormal findings: Secondary | ICD-10-CM | POA: Diagnosis not present

## 2016-04-24 DIAGNOSIS — E785 Hyperlipidemia, unspecified: Secondary | ICD-10-CM | POA: Diagnosis not present

## 2016-04-24 DIAGNOSIS — E781 Pure hyperglyceridemia: Secondary | ICD-10-CM | POA: Diagnosis not present

## 2016-04-24 DIAGNOSIS — Z1211 Encounter for screening for malignant neoplasm of colon: Secondary | ICD-10-CM | POA: Diagnosis not present

## 2016-06-05 DIAGNOSIS — Z0000 Encounter for general adult medical examination without abnormal findings: Secondary | ICD-10-CM | POA: Diagnosis not present

## 2016-06-05 DIAGNOSIS — E785 Hyperlipidemia, unspecified: Secondary | ICD-10-CM | POA: Diagnosis not present

## 2016-06-05 DIAGNOSIS — Z136 Encounter for screening for cardiovascular disorders: Secondary | ICD-10-CM | POA: Diagnosis not present

## 2016-06-10 DIAGNOSIS — L570 Actinic keratosis: Secondary | ICD-10-CM | POA: Diagnosis not present

## 2016-06-10 DIAGNOSIS — L820 Inflamed seborrheic keratosis: Secondary | ICD-10-CM | POA: Diagnosis not present

## 2016-06-10 DIAGNOSIS — L821 Other seborrheic keratosis: Secondary | ICD-10-CM | POA: Diagnosis not present

## 2016-06-11 DIAGNOSIS — Z136 Encounter for screening for cardiovascular disorders: Secondary | ICD-10-CM | POA: Diagnosis not present

## 2016-06-11 DIAGNOSIS — I700 Atherosclerosis of aorta: Secondary | ICD-10-CM | POA: Diagnosis not present

## 2016-10-09 DIAGNOSIS — Z125 Encounter for screening for malignant neoplasm of prostate: Secondary | ICD-10-CM | POA: Diagnosis not present

## 2016-11-17 DIAGNOSIS — N5201 Erectile dysfunction due to arterial insufficiency: Secondary | ICD-10-CM | POA: Diagnosis not present

## 2016-11-17 DIAGNOSIS — R350 Frequency of micturition: Secondary | ICD-10-CM | POA: Diagnosis not present

## 2016-11-17 DIAGNOSIS — N401 Enlarged prostate with lower urinary tract symptoms: Secondary | ICD-10-CM | POA: Diagnosis not present

## 2016-12-04 DIAGNOSIS — M79622 Pain in left upper arm: Secondary | ICD-10-CM | POA: Diagnosis not present

## 2016-12-04 DIAGNOSIS — R634 Abnormal weight loss: Secondary | ICD-10-CM | POA: Diagnosis not present

## 2016-12-04 DIAGNOSIS — E785 Hyperlipidemia, unspecified: Secondary | ICD-10-CM | POA: Diagnosis not present

## 2016-12-04 DIAGNOSIS — Z1211 Encounter for screening for malignant neoplasm of colon: Secondary | ICD-10-CM | POA: Diagnosis not present

## 2017-04-02 DIAGNOSIS — M67912 Unspecified disorder of synovium and tendon, left shoulder: Secondary | ICD-10-CM | POA: Diagnosis not present

## 2017-04-29 DIAGNOSIS — Z1211 Encounter for screening for malignant neoplasm of colon: Secondary | ICD-10-CM | POA: Diagnosis not present

## 2017-04-29 DIAGNOSIS — N529 Male erectile dysfunction, unspecified: Secondary | ICD-10-CM | POA: Diagnosis not present

## 2017-04-29 DIAGNOSIS — Z0000 Encounter for general adult medical examination without abnormal findings: Secondary | ICD-10-CM | POA: Diagnosis not present

## 2017-04-29 DIAGNOSIS — E785 Hyperlipidemia, unspecified: Secondary | ICD-10-CM | POA: Diagnosis not present

## 2017-04-29 DIAGNOSIS — Z23 Encounter for immunization: Secondary | ICD-10-CM | POA: Diagnosis not present

## 2017-06-30 DIAGNOSIS — Z0000 Encounter for general adult medical examination without abnormal findings: Secondary | ICD-10-CM | POA: Diagnosis not present

## 2017-09-24 DIAGNOSIS — M79671 Pain in right foot: Secondary | ICD-10-CM | POA: Diagnosis not present

## 2017-09-24 DIAGNOSIS — M25512 Pain in left shoulder: Secondary | ICD-10-CM | POA: Diagnosis not present

## 2017-09-24 DIAGNOSIS — M545 Low back pain: Secondary | ICD-10-CM | POA: Diagnosis not present

## 2017-09-24 DIAGNOSIS — M5442 Lumbago with sciatica, left side: Secondary | ICD-10-CM | POA: Diagnosis not present

## 2017-10-01 DIAGNOSIS — L570 Actinic keratosis: Secondary | ICD-10-CM | POA: Diagnosis not present

## 2017-10-01 DIAGNOSIS — L814 Other melanin hyperpigmentation: Secondary | ICD-10-CM | POA: Diagnosis not present

## 2017-10-01 DIAGNOSIS — D1801 Hemangioma of skin and subcutaneous tissue: Secondary | ICD-10-CM | POA: Diagnosis not present

## 2017-10-01 DIAGNOSIS — D2272 Melanocytic nevi of left lower limb, including hip: Secondary | ICD-10-CM | POA: Diagnosis not present

## 2017-10-01 DIAGNOSIS — L821 Other seborrheic keratosis: Secondary | ICD-10-CM | POA: Diagnosis not present

## 2017-10-01 DIAGNOSIS — D225 Melanocytic nevi of trunk: Secondary | ICD-10-CM | POA: Diagnosis not present

## 2017-10-01 DIAGNOSIS — L820 Inflamed seborrheic keratosis: Secondary | ICD-10-CM | POA: Diagnosis not present

## 2017-10-26 DIAGNOSIS — M545 Low back pain: Secondary | ICD-10-CM | POA: Diagnosis not present

## 2017-10-26 DIAGNOSIS — M25552 Pain in left hip: Secondary | ICD-10-CM | POA: Diagnosis not present

## 2017-10-26 DIAGNOSIS — M5442 Lumbago with sciatica, left side: Secondary | ICD-10-CM | POA: Diagnosis not present

## 2017-10-27 DIAGNOSIS — M5416 Radiculopathy, lumbar region: Secondary | ICD-10-CM | POA: Diagnosis not present

## 2017-11-04 DIAGNOSIS — M5416 Radiculopathy, lumbar region: Secondary | ICD-10-CM | POA: Diagnosis not present

## 2017-11-08 ENCOUNTER — Encounter (HOSPITAL_COMMUNITY)
Admission: EM | Disposition: A | Discharge status: Home Health | Payer: Self-pay | Source: Home / Self Care | Attending: Cardiovascular Disease

## 2017-11-08 ENCOUNTER — Inpatient Hospital Stay (HOSPITAL_COMMUNITY)
Admission: EM | Admit: 2017-11-08 | Discharge: 2017-11-17 | DRG: 233 | Disposition: A | Discharge status: Home Health | Payer: PPO | Attending: Cardiothoracic Surgery | Admitting: Cardiothoracic Surgery

## 2017-11-08 ENCOUNTER — Emergency Department (HOSPITAL_COMMUNITY): Payer: PPO

## 2017-11-08 DIAGNOSIS — I4510 Unspecified right bundle-branch block: Secondary | ICD-10-CM | POA: Diagnosis not present

## 2017-11-08 DIAGNOSIS — M25512 Pain in left shoulder: Secondary | ICD-10-CM | POA: Diagnosis not present

## 2017-11-08 DIAGNOSIS — I469 Cardiac arrest, cause unspecified: Secondary | ICD-10-CM | POA: Diagnosis not present

## 2017-11-08 DIAGNOSIS — I2109 ST elevation (STEMI) myocardial infarction involving other coronary artery of anterior wall: Principal | ICD-10-CM | POA: Diagnosis present

## 2017-11-08 DIAGNOSIS — R918 Other nonspecific abnormal finding of lung field: Secondary | ICD-10-CM | POA: Diagnosis not present

## 2017-11-08 DIAGNOSIS — J90 Pleural effusion, not elsewhere classified: Secondary | ICD-10-CM | POA: Diagnosis not present

## 2017-11-08 DIAGNOSIS — I25110 Atherosclerotic heart disease of native coronary artery with unstable angina pectoris: Secondary | ICD-10-CM | POA: Diagnosis not present

## 2017-11-08 DIAGNOSIS — N179 Acute kidney failure, unspecified: Secondary | ICD-10-CM | POA: Diagnosis not present

## 2017-11-08 DIAGNOSIS — I25810 Atherosclerosis of coronary artery bypass graft(s) without angina pectoris: Secondary | ICD-10-CM | POA: Diagnosis not present

## 2017-11-08 DIAGNOSIS — I462 Cardiac arrest due to underlying cardiac condition: Secondary | ICD-10-CM | POA: Diagnosis not present

## 2017-11-08 DIAGNOSIS — I213 ST elevation (STEMI) myocardial infarction of unspecified site: Secondary | ICD-10-CM | POA: Diagnosis not present

## 2017-11-08 DIAGNOSIS — D62 Acute posthemorrhagic anemia: Secondary | ICD-10-CM | POA: Diagnosis not present

## 2017-11-08 DIAGNOSIS — Z4682 Encounter for fitting and adjustment of non-vascular catheter: Secondary | ICD-10-CM | POA: Diagnosis not present

## 2017-11-08 DIAGNOSIS — I255 Ischemic cardiomyopathy: Secondary | ICD-10-CM | POA: Diagnosis present

## 2017-11-08 DIAGNOSIS — I252 Old myocardial infarction: Secondary | ICD-10-CM

## 2017-11-08 DIAGNOSIS — I493 Ventricular premature depolarization: Secondary | ICD-10-CM | POA: Diagnosis not present

## 2017-11-08 DIAGNOSIS — I2102 ST elevation (STEMI) myocardial infarction involving left anterior descending coronary artery: Secondary | ICD-10-CM | POA: Diagnosis not present

## 2017-11-08 DIAGNOSIS — I472 Ventricular tachycardia: Secondary | ICD-10-CM | POA: Diagnosis not present

## 2017-11-08 DIAGNOSIS — I480 Paroxysmal atrial fibrillation: Secondary | ICD-10-CM | POA: Diagnosis not present

## 2017-11-08 DIAGNOSIS — Z9689 Presence of other specified functional implants: Secondary | ICD-10-CM

## 2017-11-08 DIAGNOSIS — E872 Acidosis: Secondary | ICD-10-CM | POA: Diagnosis present

## 2017-11-08 DIAGNOSIS — Z8572 Personal history of non-Hodgkin lymphomas: Secondary | ICD-10-CM

## 2017-11-08 DIAGNOSIS — Z01810 Encounter for preprocedural cardiovascular examination: Secondary | ICD-10-CM | POA: Diagnosis not present

## 2017-11-08 DIAGNOSIS — I200 Unstable angina: Secondary | ICD-10-CM | POA: Diagnosis not present

## 2017-11-08 DIAGNOSIS — Z951 Presence of aortocoronary bypass graft: Secondary | ICD-10-CM | POA: Diagnosis not present

## 2017-11-08 DIAGNOSIS — Z9911 Dependence on respirator [ventilator] status: Secondary | ICD-10-CM

## 2017-11-08 DIAGNOSIS — E785 Hyperlipidemia, unspecified: Secondary | ICD-10-CM | POA: Diagnosis not present

## 2017-11-08 DIAGNOSIS — N400 Enlarged prostate without lower urinary tract symptoms: Secondary | ICD-10-CM | POA: Diagnosis present

## 2017-11-08 DIAGNOSIS — I5021 Acute systolic (congestive) heart failure: Secondary | ICD-10-CM | POA: Diagnosis present

## 2017-11-08 DIAGNOSIS — J9601 Acute respiratory failure with hypoxia: Secondary | ICD-10-CM | POA: Diagnosis present

## 2017-11-08 DIAGNOSIS — Z8674 Personal history of sudden cardiac arrest: Secondary | ICD-10-CM | POA: Diagnosis present

## 2017-11-08 DIAGNOSIS — I4901 Ventricular fibrillation: Secondary | ICD-10-CM | POA: Diagnosis not present

## 2017-11-08 DIAGNOSIS — J9600 Acute respiratory failure, unspecified whether with hypoxia or hypercapnia: Secondary | ICD-10-CM

## 2017-11-08 DIAGNOSIS — R0902 Hypoxemia: Secondary | ICD-10-CM | POA: Diagnosis not present

## 2017-11-08 DIAGNOSIS — I214 Non-ST elevation (NSTEMI) myocardial infarction: Secondary | ICD-10-CM | POA: Diagnosis not present

## 2017-11-08 DIAGNOSIS — E876 Hypokalemia: Secondary | ICD-10-CM | POA: Diagnosis not present

## 2017-11-08 DIAGNOSIS — R001 Bradycardia, unspecified: Secondary | ICD-10-CM | POA: Diagnosis not present

## 2017-11-08 DIAGNOSIS — I2510 Atherosclerotic heart disease of native coronary artery without angina pectoris: Secondary | ICD-10-CM | POA: Diagnosis not present

## 2017-11-08 DIAGNOSIS — Z09 Encounter for follow-up examination after completed treatment for conditions other than malignant neoplasm: Secondary | ICD-10-CM

## 2017-11-08 DIAGNOSIS — R079 Chest pain, unspecified: Secondary | ICD-10-CM | POA: Diagnosis not present

## 2017-11-08 DIAGNOSIS — I083 Combined rheumatic disorders of mitral, aortic and tricuspid valves: Secondary | ICD-10-CM | POA: Diagnosis not present

## 2017-11-08 DIAGNOSIS — I2584 Coronary atherosclerosis due to calcified coronary lesion: Secondary | ICD-10-CM | POA: Diagnosis present

## 2017-11-08 DIAGNOSIS — Z8679 Personal history of other diseases of the circulatory system: Secondary | ICD-10-CM

## 2017-11-08 HISTORY — DX: Benign prostatic hyperplasia without lower urinary tract symptoms: N40.0

## 2017-11-08 HISTORY — PX: LEFT HEART CATH AND CORONARY ANGIOGRAPHY: CATH118249

## 2017-11-08 HISTORY — DX: Hyperlipidemia, unspecified: E78.5

## 2017-11-08 LAB — BLOOD GAS, ARTERIAL
Acid-base deficit: 4.6 mmol/L — ABNORMAL HIGH (ref 0.0–2.0)
BICARBONATE: 20.6 mmol/L (ref 20.0–28.0)
DRAWN BY: 252031
FIO2: 100
LHR: 14 {breaths}/min
MECHVT: 620 mL
O2 Saturation: 99.1 %
PEEP: 5 cmH2O
Patient temperature: 98.6
pCO2 arterial: 42.1 mmHg (ref 32.0–48.0)
pH, Arterial: 7.311 — ABNORMAL LOW (ref 7.350–7.450)
pO2, Arterial: 366 mmHg — ABNORMAL HIGH (ref 83.0–108.0)

## 2017-11-08 LAB — I-STAT CHEM 8, ED
BUN: 27 mg/dL — ABNORMAL HIGH (ref 6–20)
CALCIUM ION: 1.05 mmol/L — AB (ref 1.15–1.40)
CHLORIDE: 105 mmol/L (ref 101–111)
Creatinine, Ser: 0.9 mg/dL (ref 0.61–1.24)
GLUCOSE: 257 mg/dL — AB (ref 65–99)
HEMATOCRIT: 48 % (ref 39.0–52.0)
HEMOGLOBIN: 16.3 g/dL (ref 13.0–17.0)
Potassium: 2.7 mmol/L — CL (ref 3.5–5.1)
SODIUM: 142 mmol/L (ref 135–145)
TCO2: 15 mmol/L — AB (ref 22–32)

## 2017-11-08 LAB — I-STAT CG4 LACTIC ACID, ED: LACTIC ACID, VENOUS: 8.99 mmol/L — AB (ref 0.5–1.9)

## 2017-11-08 LAB — I-STAT TROPONIN, ED: Troponin i, poc: 0.06 ng/mL (ref 0.00–0.08)

## 2017-11-08 LAB — CBG MONITORING, ED: Glucose-Capillary: 119 mg/dL — ABNORMAL HIGH (ref 65–99)

## 2017-11-08 LAB — MRSA PCR SCREENING: MRSA by PCR: NEGATIVE

## 2017-11-08 SURGERY — LEFT HEART CATH AND CORONARY ANGIOGRAPHY
Anesthesia: LOCAL

## 2017-11-08 MED ORDER — FENTANYL CITRATE (PF) 100 MCG/2ML IJ SOLN
INTRAMUSCULAR | Status: AC
  Filled 2017-11-08: qty 2

## 2017-11-08 MED ORDER — ASPIRIN 81 MG PO CHEW
81.0000 mg | CHEWABLE_TABLET | Freq: Every day | ORAL | Status: DC
  Administered 2017-11-09 – 2017-11-12 (×4): 81 mg via ORAL
  Filled 2017-11-08 (×4): qty 1

## 2017-11-08 MED ORDER — FENTANYL 2500MCG IN NS 250ML (10MCG/ML) PREMIX INFUSION
0.0000 ug/h | INTRAVENOUS | Status: DC
  Administered 2017-11-08: 10 ug/h via INTRAVENOUS
  Administered 2017-11-09: 150 ug/h via INTRAVENOUS
  Filled 2017-11-08 (×2): qty 250

## 2017-11-08 MED ORDER — MIDAZOLAM HCL 2 MG/2ML IJ SOLN
1.0000 mg | INTRAMUSCULAR | Status: DC | PRN
  Administered 2017-11-09 (×4): 2 mg via INTRAVENOUS
  Filled 2017-11-08 (×4): qty 2

## 2017-11-08 MED ORDER — LABETALOL HCL 5 MG/ML IV SOLN: 10.0000 mg | INTRAVENOUS | Status: AC | PRN

## 2017-11-08 MED ORDER — HYDRALAZINE HCL 20 MG/ML IJ SOLN: 5.0000 mg | INTRAMUSCULAR | Status: AC | PRN

## 2017-11-08 MED ORDER — SODIUM CHLORIDE 0.9% FLUSH
3.0000 mL | Freq: Two times a day (BID) | INTRAVENOUS | Status: DC
  Administered 2017-11-09 – 2017-11-12 (×7): 3 mL via INTRAVENOUS

## 2017-11-08 MED ORDER — HEPARIN (PORCINE) IN NACL 2-0.9 UNIT/ML-% IJ SOLN
INTRAMUSCULAR | Status: AC
  Filled 2017-11-08: qty 500

## 2017-11-08 MED ORDER — LIDOCAINE HCL (PF) 1 % IJ SOLN
INTRAMUSCULAR | Status: AC
  Filled 2017-11-08: qty 30

## 2017-11-08 MED ORDER — AMIODARONE HCL IN DEXTROSE 360-4.14 MG/200ML-% IV SOLN
INTRAVENOUS | Status: AC
  Filled 2017-11-08: qty 200

## 2017-11-08 MED ORDER — SODIUM CHLORIDE 0.9 % IV SOLN
INTRAVENOUS | Status: AC | PRN
  Administered 2017-11-08: 100 mL/h via INTRAVENOUS

## 2017-11-08 MED ORDER — TIROFIBAN HCL IN NACL 5-0.9 MG/100ML-% IV SOLN
INTRAVENOUS | Status: AC
  Filled 2017-11-08: qty 100

## 2017-11-08 MED ORDER — SODIUM CHLORIDE 0.9 % IV SOLN
INTRAVENOUS | Status: DC
  Administered 2017-11-08 – 2017-11-09 (×2): via INTRAVENOUS

## 2017-11-08 MED ORDER — SODIUM CHLORIDE 0.9 % IV SOLN
250.0000 mL | INTRAVENOUS | Status: DC | PRN
  Administered 2017-11-10: 250 mL via INTRAVENOUS

## 2017-11-08 MED ORDER — HEPARIN (PORCINE) IN NACL 2-0.9 UNIT/ML-% IJ SOLN
INTRAMUSCULAR | Status: AC
  Filled 2017-11-08: qty 1000

## 2017-11-08 MED ORDER — HEPARIN SODIUM (PORCINE) 5000 UNIT/ML IJ SOLN
INTRAMUSCULAR | Status: AC
  Filled 2017-11-08: qty 1

## 2017-11-08 MED ORDER — AMIODARONE HCL IN DEXTROSE 360-4.14 MG/200ML-% IV SOLN
30.0000 mg/h | INTRAVENOUS | Status: DC
  Administered 2017-11-09 (×2): 30 mg/h via INTRAVENOUS
  Filled 2017-11-08 (×2): qty 200

## 2017-11-08 MED ORDER — METOPROLOL TARTRATE 5 MG/5ML IV SOLN
INTRAVENOUS | Status: AC
  Filled 2017-11-08: qty 5

## 2017-11-08 MED ORDER — MIDAZOLAM HCL 2 MG/2ML IJ SOLN
INTRAMUSCULAR | Status: AC
  Filled 2017-11-08: qty 2

## 2017-11-08 MED ORDER — FENTANYL CITRATE (PF) 100 MCG/2ML IJ SOLN
INTRAMUSCULAR | Status: AC | PRN
  Administered 2017-11-08: 50 ug via INTRAVENOUS

## 2017-11-08 MED ORDER — TIROFIBAN (AGGRASTAT) BOLUS VIA INFUSION
INTRAVENOUS | Status: DC | PRN
  Administered 2017-11-08: 1925 ug via INTRAVENOUS

## 2017-11-08 MED ORDER — PANTOPRAZOLE SODIUM 40 MG IV SOLR
40.0000 mg | Freq: Every day | INTRAVENOUS | Status: DC
  Administered 2017-11-09 – 2017-11-12 (×5): 40 mg via INTRAVENOUS
  Filled 2017-11-08 (×5): qty 40

## 2017-11-08 MED ORDER — MIDAZOLAM HCL 5 MG/5ML IJ SOLN
INTRAMUSCULAR | Status: AC | PRN
  Administered 2017-11-08: 2 mg via INTRAVENOUS

## 2017-11-08 MED ORDER — HEPARIN (PORCINE) IN NACL 100-0.45 UNIT/ML-% IJ SOLN
1300.0000 [IU]/h | INTRAMUSCULAR | Status: DC
  Administered 2017-11-09: 1000 [IU]/h via INTRAVENOUS
  Administered 2017-11-10: 1150 [IU]/h via INTRAVENOUS
  Administered 2017-11-10 – 2017-11-11 (×3): 1250 [IU]/h via INTRAVENOUS
  Administered 2017-11-12: 1300 [IU]/h via INTRAVENOUS
  Filled 2017-11-08 (×5): qty 250

## 2017-11-08 MED ORDER — AMIODARONE HCL IN DEXTROSE 360-4.14 MG/200ML-% IV SOLN
60.0000 mg/h | INTRAVENOUS | Status: AC
  Administered 2017-11-08 – 2017-11-09 (×2): 60 mg/h via INTRAVENOUS
  Filled 2017-11-08: qty 200

## 2017-11-08 MED ORDER — SODIUM CHLORIDE 0.9 % IV SOLN
INTRAVENOUS | Status: AC | PRN
  Administered 2017-11-08: 50 mL/h via INTRAVENOUS

## 2017-11-08 MED ORDER — AMIODARONE LOAD VIA INFUSION
150.0000 mg | Freq: Once | INTRAVENOUS | Status: AC
  Administered 2017-11-08: 150 mg via INTRAVENOUS

## 2017-11-08 MED ORDER — FENTANYL CITRATE (PF) 100 MCG/2ML IJ SOLN
INTRAMUSCULAR | Status: DC | PRN
  Administered 2017-11-08: 50 ug via INTRAVENOUS

## 2017-11-08 MED ORDER — TIROFIBAN HCL IN NACL 5-0.9 MG/100ML-% IV SOLN
0.1500 ug/kg/min | INTRAVENOUS | Status: AC
  Administered 2017-11-08 – 2017-11-09 (×3): 0.15 ug/kg/min via INTRAVENOUS
  Filled 2017-11-08 (×3): qty 100

## 2017-11-08 MED ORDER — ETOMIDATE 2 MG/ML IV SOLN
INTRAVENOUS | Status: AC | PRN
  Administered 2017-11-08: 25 mg via INTRAVENOUS

## 2017-11-08 MED ORDER — ONDANSETRON HCL 4 MG/2ML IJ SOLN
4.0000 mg | Freq: Four times a day (QID) | INTRAMUSCULAR | Status: DC | PRN
  Administered 2017-11-09 – 2017-11-13 (×6): 4 mg via INTRAVENOUS
  Filled 2017-11-08 (×6): qty 2

## 2017-11-08 MED ORDER — MIDAZOLAM HCL 2 MG/2ML IJ SOLN
INTRAMUSCULAR | Status: DC | PRN
  Administered 2017-11-08 (×2): 2 mg via INTRAVENOUS

## 2017-11-08 MED ORDER — IOPAMIDOL (ISOVUE-370) INJECTION 76%
INTRAVENOUS | Status: AC
  Filled 2017-11-08: qty 125

## 2017-11-08 MED ORDER — SODIUM CHLORIDE 0.9% FLUSH: 3.0000 mL | INTRAVENOUS | Status: DC | PRN

## 2017-11-08 MED ORDER — FENTANYL CITRATE (PF) 100 MCG/2ML IJ SOLN
INTRAMUSCULAR | Status: AC
  Administered 2017-11-08: 50 ug
  Filled 2017-11-08: qty 2

## 2017-11-08 MED ORDER — ACETAMINOPHEN 325 MG PO TABS
650.0000 mg | ORAL_TABLET | ORAL | Status: DC | PRN
  Administered 2017-11-11: 650 mg via ORAL
  Filled 2017-11-08: qty 2

## 2017-11-08 MED ORDER — TIROFIBAN HCL IN NACL 5-0.9 MG/100ML-% IV SOLN
INTRAVENOUS | Status: AC | PRN
  Administered 2017-11-08: 0.15 ug/kg/min via INTRAVENOUS

## 2017-11-08 MED ORDER — SUCCINYLCHOLINE CHLORIDE 20 MG/ML IJ SOLN
INTRAMUSCULAR | Status: AC | PRN
  Administered 2017-11-08: 120 mg via INTRAVENOUS

## 2017-11-08 MED ORDER — METOPROLOL TARTRATE 5 MG/5ML IV SOLN
INTRAVENOUS | Status: DC | PRN
  Administered 2017-11-08: 2.5 mg via INTRAVENOUS

## 2017-11-08 MED ORDER — ATORVASTATIN CALCIUM 80 MG PO TABS
80.0000 mg | ORAL_TABLET | Freq: Every day | ORAL | Status: DC
  Administered 2017-11-10 – 2017-11-17 (×7): 80 mg via ORAL
  Filled 2017-11-08 (×7): qty 1

## 2017-11-08 MED ORDER — LIDOCAINE HCL (PF) 1 % IJ SOLN
INTRAMUSCULAR | Status: DC | PRN
  Administered 2017-11-08: 18 mL via SUBCUTANEOUS

## 2017-11-08 MED ORDER — IOPAMIDOL (ISOVUE-370) INJECTION 76%
INTRAVENOUS | Status: DC | PRN
  Administered 2017-11-08: 90 mL via INTRA_ARTERIAL

## 2017-11-08 MED ORDER — MIDAZOLAM HCL 2 MG/2ML IJ SOLN: 1.0000 mg | INTRAMUSCULAR | Status: DC | PRN

## 2017-11-08 MED ORDER — SODIUM CHLORIDE 0.9 % IV SOLN
250.0000 mL | INTRAVENOUS | Status: DC | PRN
  Administered 2017-11-13: 10:00:00 via INTRAVENOUS

## 2017-11-08 MED ORDER — MIDAZOLAM BOLUS VIA INFUSION
2.0000 mg | INTRAVENOUS | Status: DC | PRN
  Administered 2017-11-08 (×3): 2 mg via INTRAVENOUS
  Filled 2017-11-08: qty 2

## 2017-11-08 MED ORDER — FENTANYL BOLUS VIA INFUSION
50.0000 ug | INTRAVENOUS | Status: DC | PRN
  Administered 2017-11-08 – 2017-11-09 (×7): 50 ug via INTRAVENOUS
  Filled 2017-11-08: qty 50

## 2017-11-08 MED ORDER — SODIUM CHLORIDE 0.9 % IV SOLN
0.5000 mg/h | INTRAVENOUS | Status: DC
  Administered 2017-11-08: 2 mg/h via INTRAVENOUS
  Filled 2017-11-08: qty 10

## 2017-11-08 MED ORDER — NOREPINEPHRINE 4 MG/250ML-% IV SOLN: 0.0000 ug/min | INTRAVENOUS | Status: DC

## 2017-11-08 MED ORDER — HEPARIN (PORCINE) IN NACL 2-0.9 UNIT/ML-% IJ SOLN
INTRAMUSCULAR | Status: AC | PRN
  Administered 2017-11-08: 1500 mL

## 2017-11-08 SURGICAL SUPPLY — 11 items
CATH INFINITI 5FR MULTPACK ANG (CATHETERS) ×1 IMPLANT
FEM STOP ARCH (HEMOSTASIS) ×1
KIT ENCORE 26 ADVANTAGE (KITS) ×2 IMPLANT
KIT HEART LEFT (KITS) ×2 IMPLANT
PACK CARDIAC CATHETERIZATION (CUSTOM PROCEDURE TRAY) ×2 IMPLANT
SHEATH PINNACLE 6F 10CM (SHEATH) ×1 IMPLANT
SYR MEDRAD MARK V 150ML (SYRINGE) ×2 IMPLANT
SYSTEM COMPRESSION FEMOSTOP (HEMOSTASIS) IMPLANT
TRANSDUCER W/STOPCOCK (MISCELLANEOUS) ×2 IMPLANT
TUBING CIL FLEX 10 FLL-RA (TUBING) ×2 IMPLANT
WIRE EMERALD 3MM-J .035X150CM (WIRE) ×1 IMPLANT

## 2017-11-08 NOTE — ED Notes (Signed)
Dr. Dayna Barker attempting to intubate
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
## 2017-11-08 NOTE — ED Notes (Signed)
Pt continues to have strong femoral and corroid pulses.  Pt intubated by Dr. Dayna Barker pt being bagged at this time

## 2017-11-08 NOTE — ED Notes (Signed)
Wife at bedside.

## 2017-11-08 NOTE — Progress Notes (Signed)
Versed 40ml wasted from infusion bag in sink by me, Elzie Rings, RN and Jenny Reichmann, Therapist, sports as witness.

## 2017-11-08 NOTE — ED Notes (Signed)
Heparin 4,000 units IV given by Izora Gala, RN

## 2017-11-08 NOTE — ED Notes (Signed)
Pt to Cath Lab at this time

## 2017-11-08 NOTE — ED Notes (Signed)
Dr Kelly at bedside

## 2017-11-08 NOTE — ED Notes (Signed)
Versed 2mg  IV and Fentanyl 31mcg IV given by Izora Gala, RN

## 2017-11-08 NOTE — ED Triage Notes (Signed)
Patient arrived by [facility] following witnessed arrest while loading patient into the ambulance. Patient complained of CP while raking leaves. Patient received CPR 15-20 inutes, EPI x 1 , amiodarone 300. Arrived with pulses and nasal trumpet. Spontaneous respirations.

## 2017-11-08 NOTE — ED Notes (Signed)
Dr.Messer shown result of Chem 8. Lactic Acid ED-LAb.

## 2017-11-08 NOTE — Progress Notes (Signed)
eLink Physician-Brief Progress Note Patient Name: Edwin Morris DOB: Jun 05, 1946 MRN: [id number]   Date of Service  11/08/2017  HPI/Events of Note   72 y.o. male who presented to the ER being brought by EMS after he developed chest pain at home following raking and during EMS transport developed a VF arrest and received CPR and defibrillation with restoration of sinus rhythm. Intubated in ED. Taken to cath lab. No H&P or cath lab note in Epic yet, therefore, not aware of findings or intervention. BP = 90/70 with MAP = 78. Management per cardiology.   eICU Interventions  No new orders. Will ask PCCM ground team to see in consultation d/t patient in now intubated and mechanically ventilated.         Vincenza Dail Cornelia Copa 11/08/2017, 9:59 PM

## 2017-11-08 NOTE — Progress Notes (Addendum)
ANTICOAGULATION CONSULT NOTE - Initial Consult  Pharmacy Consult for Heparin Indication: chest pain/ACS  Allergies not on file  Patient Measurements: Height: 6' (182.9 cm) IBW/kg (Calculated) : 77.6  Vital Signs: Temp: 96.6 F (35.9 C) (03/10 1749) Temp Source: Tympanic (03/10 1749) BP: 101/66 (03/10 1956) Pulse Rate: 70 (03/10 2023)  Labs: Recent Labs    11/08/17 1752  HGB 16.3  HCT 48.0  CREATININE 0.90    CrCl cannot be calculated (Unknown ideal weight.).   Assessment: 72 year old post CPR with STEMI, now s/p cath with plans to resume heparin 6 hours post sheath removal, awaiting possible CABG  Goal of Therapy:  Heparin level 0.3-0.7 units/ml Monitor platelets by anticoagulation protocol: Yes  Heparin level = 0.3 to 0.5 (with Aggrastat)   Plan:  Heparin at 1000 units / hr starting at 2 am Heparin level 8 hours after heparin starts Daily heparin level, CBC  Aggrastat x 18 hours  Thank you Anette Guarneri, PharmD [phone]  11/08/2017,8:56 PM

## 2017-11-08 NOTE — Progress Notes (Signed)
   11/08/17 1900  Clinical Encounter Type  Visited With Patient;Patient and family together  Visit Type Initial  Referral From Nurse  Consult/Referral To Chaplain  Spiritual Encounters  Spiritual Needs Emotional  Stress Factors  Patient Stress Factors Exhausted  Family Stress Factors Exhausted    This was a CPR code stemi page for a 72 yr old male caucasian pt. Wife was on-site and very distressed in tears Pt got intubated immediately. Pt's wife requested chaplain to get more of her family members from the ED waiting room which she did. I later escorted all of them to the cath lab waiting room as Pt proceeded to his procedure. Chaplain provided emotional support through her compassionate presence.  Anupama Piehl a Medical sales representative, [employer]

## 2017-11-08 NOTE — ED Notes (Signed)
Pulses remains strong
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
## 2017-11-09 ENCOUNTER — Other Ambulatory Visit: Payer: Self-pay | Admitting: *Deleted

## 2017-11-09 ENCOUNTER — Inpatient Hospital Stay (HOSPITAL_COMMUNITY): Payer: PPO

## 2017-11-09 ENCOUNTER — Encounter (HOSPITAL_COMMUNITY): Payer: Self-pay | Admitting: Cardiovascular Disease

## 2017-11-09 DIAGNOSIS — R0902 Hypoxemia: Secondary | ICD-10-CM

## 2017-11-09 DIAGNOSIS — I213 ST elevation (STEMI) myocardial infarction of unspecified site: Secondary | ICD-10-CM

## 2017-11-09 DIAGNOSIS — I2510 Atherosclerotic heart disease of native coronary artery without angina pectoris: Secondary | ICD-10-CM

## 2017-11-09 DIAGNOSIS — J9601 Acute respiratory failure with hypoxia: Secondary | ICD-10-CM

## 2017-11-09 DIAGNOSIS — I25110 Atherosclerotic heart disease of native coronary artery with unstable angina pectoris: Secondary | ICD-10-CM

## 2017-11-09 DIAGNOSIS — I214 Non-ST elevation (NSTEMI) myocardial infarction: Secondary | ICD-10-CM

## 2017-11-09 DIAGNOSIS — I469 Cardiac arrest, cause unspecified: Secondary | ICD-10-CM

## 2017-11-09 DIAGNOSIS — Z9911 Dependence on respirator [ventilator] status: Secondary | ICD-10-CM

## 2017-11-09 DIAGNOSIS — I4901 Ventricular fibrillation: Secondary | ICD-10-CM

## 2017-11-09 DIAGNOSIS — I493 Ventricular premature depolarization: Secondary | ICD-10-CM

## 2017-11-09 LAB — BASIC METABOLIC PANEL
ANION GAP: 11 (ref 5–15)
Anion gap: 11 (ref 5–15)
BUN: 23 mg/dL — ABNORMAL HIGH (ref 6–20)
BUN: 24 mg/dL — AB (ref 6–20)
CALCIUM: 8.1 mg/dL — AB (ref 8.9–10.3)
CALCIUM: 8.2 mg/dL — AB (ref 8.9–10.3)
CHLORIDE: 110 mmol/L (ref 101–111)
CO2: 19 mmol/L — ABNORMAL LOW (ref 22–32)
CO2: 18 mmol/L — ABNORMAL LOW (ref 22–32)
CREATININE: 0.7 mg/dL (ref 0.61–1.24)
CREATININE: 0.72 mg/dL (ref 0.61–1.24)
Chloride: 111 mmol/L (ref 101–111)
GFR calc Af Amer: >60 mL/min (ref >60)
GLUCOSE: 143 mg/dL — AB (ref 65–99)
Glucose, Bld: 144 mg/dL — ABNORMAL HIGH (ref 65–99)
Potassium: 4 mmol/L (ref 3.5–5.1)
Potassium: 4.1 mmol/L (ref 3.5–5.1)
SODIUM: 140 mmol/L (ref 135–145)
Sodium: 140 mmol/L (ref 135–145)

## 2017-11-09 LAB — CBC
HCT: 44.9 % (ref 39.0–52.0)
HEMATOCRIT: 44.4 % (ref 39.0–52.0)
HEMOGLOBIN: 15.2 g/dL (ref 13.0–17.0)
Hemoglobin: 14.8 g/dL (ref 13.0–17.0)
MCH: 29.6 pg (ref 26.0–34.0)
MCH: 29.2 pg (ref 26.0–34.0)
MCHC: 33.9 g/dL (ref 30.0–36.0)
MCHC: 33.3 g/dL (ref 30.0–36.0)
MCV: 87.4 fL (ref 78.0–100.0)
MCV: 87.7 fL (ref 78.0–100.0)
PLATELETS: 213 10*3/uL (ref 150–400)
Platelets: 195 10*3/uL (ref 150–400)
RBC: 5.14 MIL/uL (ref 4.22–5.81)
RBC: 5.06 MIL/uL (ref 4.22–5.81)
RDW: 13.4 % (ref 11.5–15.5)
RDW: 13.6 % (ref 11.5–15.5)
WBC: 11.9 10*3/uL — ABNORMAL HIGH (ref 4.0–10.5)
WBC: 11.2 10*3/uL — ABNORMAL HIGH (ref 4.0–10.5)

## 2017-11-09 LAB — URINALYSIS, ROUTINE W REFLEX MICROSCOPIC
Bilirubin Urine: NEGATIVE
Glucose, UA: 50 mg/dL — AB
KETONES UR: NEGATIVE mg/dL
Leukocytes, UA: NEGATIVE
Nitrite: NEGATIVE
Protein, ur: NEGATIVE mg/dL
Specific Gravity, Urine: >1.046 — ABNORMAL HIGH (ref 1.005–1.030)
pH: 5 (ref 5.0–8.0)

## 2017-11-09 LAB — BLOOD GAS, ARTERIAL
ACID-BASE DEFICIT: 4.5 mmol/L — AB (ref 0.0–2.0)
BICARBONATE: 20.3 mmol/L (ref 20.0–28.0)
Drawn by: 252031
FIO2: 40
LHR: 14 {breaths}/min
O2 Saturation: 98.8 %
PEEP/CPAP: 5 cmH2O
Patient temperature: 98.6
VT: 620 mL
pCO2 arterial: 39.4 mmHg (ref 32.0–48.0)
pH, Arterial: 7.333 — ABNORMAL LOW (ref 7.350–7.450)
pO2, Arterial: 152 mmHg — ABNORMAL HIGH (ref 83.0–108.0)

## 2017-11-09 LAB — LACTIC ACID, PLASMA: LACTIC ACID, VENOUS: 1.5 mmol/L (ref 0.5–1.9)

## 2017-11-09 LAB — GLUCOSE, CAPILLARY
GLUCOSE-CAPILLARY: 132 mg/dL — AB (ref 65–99)
Glucose-Capillary: 106 mg/dL — ABNORMAL HIGH (ref 65–99)
Glucose-Capillary: 117 mg/dL — ABNORMAL HIGH (ref 65–99)
Glucose-Capillary: 130 mg/dL — ABNORMAL HIGH (ref 65–99)
Glucose-Capillary: 142 mg/dL — ABNORMAL HIGH (ref 65–99)
Glucose-Capillary: 142 mg/dL — ABNORMAL HIGH (ref 65–99)

## 2017-11-09 LAB — PHOSPHORUS: Phosphorus: 3.2 mg/dL (ref 2.5–4.6)

## 2017-11-09 LAB — TROPONIN I
TROPONIN I: 53.02 ng/mL — AB (ref <0.03)
Troponin I: 16.48 ng/mL (ref <0.03)
Troponin I: 40.96 ng/mL (ref <0.03)
Troponin I: 48.19 ng/mL (ref <0.03)

## 2017-11-09 LAB — ECHOCARDIOGRAM COMPLETE
Height: 72 in
Weight: 2832.47 oz

## 2017-11-09 LAB — MAGNESIUM: MAGNESIUM: 1.8 mg/dL (ref 1.7–2.4)

## 2017-11-09 LAB — HEPARIN LEVEL (UNFRACTIONATED)
Heparin Unfractionated: 0.31 IU/mL (ref 0.30–0.70)
Heparin Unfractionated: 0.21 IU/mL — ABNORMAL LOW (ref 0.30–0.70)

## 2017-11-09 LAB — PROCALCITONIN: Procalcitonin: <0.1 ng/mL

## 2017-11-09 MED ORDER — INSULIN ASPART 100 UNIT/ML ~~LOC~~ SOLN
0.0000 [IU] | SUBCUTANEOUS | Status: DC
  Administered 2017-11-09 – 2017-11-12 (×6): 1 [IU] via SUBCUTANEOUS

## 2017-11-09 MED ORDER — TIROFIBAN HCL IN NACL 5-0.9 MG/100ML-% IV SOLN
0.1500 ug/kg/min | INTRAVENOUS | Status: DC
  Administered 2017-11-09 – 2017-11-10 (×2): 0.15 ug/kg/min via INTRAVENOUS
  Filled 2017-11-09 (×2): qty 100

## 2017-11-09 MED ORDER — MAGNESIUM SULFATE 2 GM/50ML IV SOLN
2.0000 g | Freq: Once | INTRAVENOUS | Status: AC
  Administered 2017-11-09: 2 g via INTRAVENOUS
  Filled 2017-11-09: qty 50

## 2017-11-09 MED ORDER — AMIODARONE IV BOLUS ONLY 150 MG/100ML: 150.0000 mg | INTRAVENOUS | Status: DC | PRN

## 2017-11-09 MED ORDER — POTASSIUM CHLORIDE 10 MEQ/100ML IV SOLN
10.0000 meq | INTRAVENOUS | Status: DC
  Administered 2017-11-09: 10 meq via INTRAVENOUS
  Filled 2017-11-09 (×5): qty 100

## 2017-11-09 MED ORDER — LACTATED RINGERS IV SOLN
INTRAVENOUS | Status: DC
  Administered 2017-11-09 – 2017-11-10 (×3): via INTRAVENOUS

## 2017-11-09 MED ORDER — INSULIN ASPART 100 UNIT/ML ~~LOC~~ SOLN: 0.0000 [IU] | Freq: Three times a day (TID) | SUBCUTANEOUS | Status: DC

## 2017-11-09 NOTE — Progress Notes (Signed)
Patient ID: Edwin Morris, male   DOB: 06/04/1946, 72 y.o.   MRN: [id number] EVENING ROUNDS NOTE :     [address]       [place] [address]             [phone]                 1 Day Post-Op Procedure(s) (LRB): LEFT HEART CATH AND CORONARY ANGIOGRAPHY (N/A)  Total Length of Stay:  LOS: 1 day  BP 110/72   Pulse 65   Temp 98 F (36.7 C) (Oral)   Resp 20   Ht 6' (1.829 m)   Wt 177 lb 0.5 oz (80.3 kg)   SpO2 97%   BMI 24.01 kg/m   .Intake/Output      03/10 0701 - 03/11 0700 03/11 0701 - 03/12 0700   I.V. (mL/kg) 2099.6 (26.1) 1268.7 (15.8)   NG/GT  0   IV Piggyback 150    Total Intake(mL/kg) 2249.6 (28) 1268.7 (15.8)   Urine (mL/kg/hr) 300 1420 (1.7)   Emesis/NG output  400   Total Output 300 1820   Net +1949.6 -551.3          . sodium chloride    . sodium chloride 250 mL (11/09/17 0803)  . amiodarone 30 mg/hr (11/09/17 1600)  . amiodarone    . fentaNYL infusion INTRAVENOUS Stopped (11/09/17 0800)  . heparin 1,000 Units/hr (11/09/17 1600)  . lactated ringers 100 mL/hr at 11/09/17 1630  . tirofiban       Lab Results  Component Value Date   WBC 11.2 (H) 11/09/2017   HGB 14.8 11/09/2017   HCT 44.4 11/09/2017   PLT 195 11/09/2017   GLUCOSE 143 (H) 11/09/2017   GLUCOSE 144 (H) 11/09/2017   NA 140 11/09/2017   NA 140 11/09/2017   K 4.1 11/09/2017   K 4.0 11/09/2017   CL 111 11/09/2017   CL 110 11/09/2017   CREATININE 0.70 11/09/2017   CREATININE 0.72 11/09/2017   BUN 24 (H) 11/09/2017   BUN 23 (H) 11/09/2017   CO2 18 (L) 11/09/2017   CO2 19 (L) 11/09/2017   Lab Results  Component Value Date   TROPONINI 48.19 [facility]) 11/09/2017    Transthoracic Echocardiography  Patient:    Edwin, Morris MR #:       [id number] Study Date: 11/09/2017 Gender:     M Age:        72 Height:     182.9 cm Weight:     80.3 kg BSA:        2.02 m^2 Pt. Status: Room:       2H19C   ADMITTING    Shelva Majestic, M.D.  ATTENDING    Shelva Majestic,  M.D.  SONOGRAPHER  Dustin Flock, RCS  PERFORMING   [facility], Inpatient  ORDERING     Hammonds, Sharyn Blitz  REFERRING    Vernie Murders H  cc:  ------------------------------------------------------------------- LV EF: 40% -   45%  ------------------------------------------------------------------- Indications:      MI - acute 410.91.  ------------------------------------------------------------------- History:   PMH:   Acute myocardial infarction.  PMH:   Ventricular fibrillation.  Sudden death episode.  Risk factors:  Dyslipidemia.   ------------------------------------------------------------------- Study Conclusions  - Left ventricle: The cavity size was normal. Wall thickness was   increased in a pattern of mild LVH. Systolic function was mildly   to moderately reduced. The estimated ejection fraction was in the   range of 40%  to 45%. Mid to distal anterior, apical and   inferoapical hypokinesis. Doppler parameters are consistent with   abnormal left ventricular relaxation (grade 1 diastolic   dysfunction). The E/e&' ratio is between 8-15, suggesting   indeterminate LV filling pressure. - Aortic valve: Trileaflet. Sclerosis without stenosis. There was   trivial regurgitation. - Mitral valve: Mildly thickened leaflets and thickening of the   subvalvular apparatus. There was trivial regurgitation. - Left atrium: Moderately dilated. - Tricuspid valve: There was trivial regurgitation. - Pulmonary arteries: PA peak pressure: 25 mm Hg (S). - Systemic veins: The IVC measures <2.1 cm, but does not collapse   >50%, suggesting an elevated RA pressure of 8 mmHg.  Impressions:  - LVEF 40-45%, mild LVH, mid to distal anterior, apical and   inferoapical severe hypokinesis suggestive of LAD territory   ischemia/infarct, grade 1 DD, indeterminate LV filing pressure,   aortic valve sclerosis with trivial AI, trivial MR, moderae LAE,   trivial TR, RVSP 25 mmHg, elevated RA  pressure of 8 mmHg (Est.).  ------------------------------------------------------------------- Study data:  No prior study was available for comparison.  Study status:  Routine.  Procedure:  The patient reported no pain pre or post test. Transthoracic echocardiography. Image quality was adequate.  Study completion:  There were no complications. Transthoracic echocardiography.  M-mode, complete 2D, spectral Doppler, and color Doppler.  Birthdate:  Patient birthdate: 11/09/1945.  Age:  Patient is 72 yr old.  Sex:  Gender: male. BMI: 24 kg/m^2.  Blood pressure:     120/73  Patient status: Inpatient.  Study date:  Study date: 11/09/2017. Study time: 02:45 PM.  Location:  Bedside.  -------------------------------------------------------------------  ------------------------------------------------------------------- Left ventricle:  The cavity size was normal. Wall thickness was increased in a pattern of mild LVH. Systolic function was mildly to moderately reduced. The estimated ejection fraction was in the range of 40% to 45%. Mid to distal anterior, apical and inferoapical hypokinesis. Doppler parameters are consistent with abnormal left ventricular relaxation (grade 1 diastolic dysfunction). The E/e&' ratio is between 8-15, suggesting indeterminate LV filling pressure.  ------------------------------------------------------------------- Aortic valve:   Trileaflet. Sclerosis without stenosis.  Doppler: There was trivial regurgitation.  ------------------------------------------------------------------- Aorta:  Aortic root: The aortic root was normal in size. Ascending aorta: The ascending aorta was normal in size.  ------------------------------------------------------------------- Mitral valve:  Mildly thickened leaflets and thickening of the subvalvular apparatus.  Doppler:  There was trivial regurgitation.   Peak gradient (D): 2 mm  Hg.  ------------------------------------------------------------------- Left atrium:  Moderately dilated.  ------------------------------------------------------------------- Right ventricle:  The cavity size was normal. Wall thickness was normal. Systolic function was normal.  ------------------------------------------------------------------- Pulmonic valve:    The valve appears to be grossly normal. Doppler:  There was no significant regurgitation.  ------------------------------------------------------------------- Tricuspid valve:   Doppler:  There was trivial regurgitation.  ------------------------------------------------------------------- Pulmonary artery:   The main pulmonary artery was normal-sized.  ------------------------------------------------------------------- Right atrium:  The atrium was normal in size.  ------------------------------------------------------------------- Pericardium:  There was no pericardial effusion.  ------------------------------------------------------------------- Systemic veins:  The IVC measures <2.1 cm, but does not collapse >50%, suggesting an elevated RA pressure of 8 mmHg.  ------------------------------------------------------------------- Measurements   Left ventricle                           Value        Reference  LV ID, ED, PLAX chordal          (H)     54  mm     43 - 52  LV ID, ES, PLAX chordal          (H)     40    mm     23 - 38  LV fx shortening, PLAX chordal   (L)     26    %      >=29  LV PW thickness, ED                      13    mm     ----------  IVS/LV PW ratio, ED                      0.69         <=1.3  Stroke volume, 2D                        68    ml     ----------  Stroke volume/bsa, 2D                    34    ml/m^2 ----------  LV e&', lateral                           9.36  cm/s   ----------  LV E/e&', lateral                         8.4          ----------  LV e&', medial                             5.87  cm/s   ----------  LV E/e&', medial                          13.39        ----------  LV e&', average                           7.62  cm/s   ----------  LV E/e&', average                         10.32        ----------    Ventricular septum                       Value        Reference  IVS thickness, ED                        9     mm     ----------    LVOT                                     Value        Reference  LVOT ID, S                               23    mm     ----------  LVOT area  4.15  cm^2   ----------  LVOT peak velocity, S                    91.6  cm/s   ----------  LVOT mean velocity, S                    57.9  cm/s   ----------  LVOT VTI, S                              16.3  cm     ----------  LVOT peak gradient, S                    3     mm Hg  ----------    Aortic valve                             Value        Reference  Aortic regurg pressure half-time         521   ms     ----------    Aorta                                    Value        Reference  Aortic root ID, ED                       31    mm     ----------    Left atrium                              Value        Reference  LA ID, A-P, ES                           43    mm     ----------  LA ID/bsa, A-P                           2.13  cm/m^2 <=2.2  LA volume, S                             74.9  ml     ----------  LA volume/bsa, S                         37    ml/m^2 ----------  LA volume, ES, 1-p A4C                   92.3  ml     ----------  LA volume/bsa, ES, 1-p A4C               45.6  ml/m^2 ----------  LA volume, ES, 1-p A2C                   58    ml     ----------  LA volume/bsa, ES, 1-p A2C               28.7  ml/m^2 ----------    Mitral valve  Value        Reference  Mitral E-wave peak velocity              78.6  cm/s   ----------  Mitral A-wave peak velocity              38.8  cm/s   ----------  Mitral deceleration time          (H)     232   ms     150 - 230  Mitral peak gradient, D                  2     mm Hg  ----------  Mitral E/A ratio, peak                   2            ----------    Pulmonary arteries                       Value        Reference  PA pressure, S, DP                       25    mm Hg  <=30    Tricuspid valve                          Value        Reference  Tricuspid regurg peak velocity           235   cm/s   ----------  Tricuspid peak RV-RA gradient            22    mm Hg  ----------    Right atrium                             Value        Reference  RA ID, S-I, ES, A4C                      48.2  mm     34 - 49  RA area, ES, A4C                         14.9  cm^2   8.3 - 19.5  RA volume, ES, A/L                       37    ml     ----------  RA volume/bsa, ES, A/L                   18.3  ml/m^2 ----------    Systemic veins                           Value        Reference  Estimated CVP                            3     mm Hg  ----------    Right ventricle                          Value  Reference  RV ID, minor axis, ED, A4C base          25    mm     ----------  TAPSE                                    22.6  mm     ----------  RV pressure, S, DP                       25    mm Hg  <=30  RV s&', lateral, S                        17.7  cm/s   ----------  Legend: (L)  and  (H)  mark values outside specified reference range.  ------------------------------------------------------------------- Prepared and Electronically Authenticated by  Lyman Bishop MD 2019-03-11T16:00:12  MB greater then 50, , echo shows  Left ventricle:  The cavity size was normal. Wall thickness was increased in a pattern of mild LVH. Systolic function was mildly to moderately reduced. The estimated ejection fraction was in the range of 40% to 45%. Mid to distal anterior, apical and inferoapical hypokinesis. Doppler parameters are consistent with abnormal left ventricular relaxation (grade 1  diastolic dysfunction). The E/e&' ratio is between 8-15, suggesting indeterminate LV filling pressure.  Probable cabg this admission     Grace Isaac MD  Beeper [phone] Office [phone] 11/09/2017 5:27 PM

## 2017-11-09 NOTE — Progress Notes (Signed)
[place] for Heparin Indication: chest pain/ACS  No Known Allergies  Patient Measurements: Height: 6' (182.9 cm) Weight: 177 lb 0.5 oz (80.3 kg) IBW/kg (Calculated) : 77.6  Vital Signs: Temp: 98 F (36.7 C) (03/11 1154) Temp Source: Oral (03/11 1154) BP: 120/73 (03/11 1500) Pulse Rate: 70 (03/11 1500)  Labs: Recent Labs    11/08/17 1752  11/09/17 0227 11/09/17 0851 11/09/17 1404  HGB 16.3  --  15.2 14.8  --   HCT 48.0  --  44.9 44.4  --   PLT  --   --  213 195  --   HEPARINUNFRC  --   --   --   --  0.31  CREATININE 0.90  --  0.70  0.72  --   --   TROPONINI  --    < > 40.96* 53.02* 48.19*   < > = values in this interval not displayed.    Estimated Creatinine Clearance: 93 mL/min (by C-G formula based on SCr of 0.7 mg/dL).   Assessment: 72 year old post CPR with STEMI, now s/p cath with plans to resume heparin 6 hours post sheath removal, awaiting possible CABG.  CBC stable.  Goal of Therapy:  Heparin level 0.3-0.7 units/ml Monitor platelets by anticoagulation protocol: Yes  Heparin level = 0.3 to 0.5 (with Aggrastat)   Plan:  Continue heparin at current rate., Confirm heparin level in 6 hrs. Daily heparin level, CBC  Aggrastat to continue until time of CABG.  Uvaldo Rising, BCPS  Clinical Pharmacist Pager [phone]  11/09/2017 3:25 PM
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
## 2017-11-09 NOTE — Progress Notes (Signed)
  Echocardiogram 2D Echocardiogram has been performed.  Dwan Fennel L Androw 11/09/2017, 3:21 PM
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
## 2017-11-09 NOTE — Progress Notes (Signed)
Orthopedic Tech Progress Note Patient Details:  Edwin Morris 09-10-1945 [id number]  Ortho Devices Type of Ortho Device: Knee Immobilizer Ortho Device/Splint Location: rle Ortho Device/Splint Interventions: Ordered, Application, Adjustment   Post Interventions Patient Tolerated: Well Instructions Provided: Care of device, Adjustment of device   Karolee Stamps 11/09/2017, 1:33 AM

## 2017-11-09 NOTE — Significant Event (Signed)
Noted abdomen very distented and firmed. RN bladder scanned and received > 200cc. Received order for foley placement.   RN placed foley using sterile technique with another RN and received back 900cc of amber, yellow urine. Abdomen now soft.     Edwin Morris

## 2017-11-09 NOTE — Procedures (Signed)
Extubation Procedure Note  Patient Details:   Name: Schyler Math Brazie DOB: 1946-06-24 MRN: [id number]   Airway Documentation:     Evaluation  O2 sats: stable throughout Complications: No apparent complications Patient did tolerate procedure well. Bilateral Breath Sounds: Clear   Yes   Pt extubated to 3L [place] per MD order. No complications noted, VS within normal limits. Pt able to speak and has a weak non-productive cough post extubation. Positive cuff leak noted prior to extubation. Pt encouraged to use yankauer to clear secretions. Bilateral diminished BS throughout, no stridor noted.   Jesse Sans 11/09/2017, 11:25 AM

## 2017-11-09 NOTE — Progress Notes (Signed)
ANTICOAGULATION CONSULT NOTE  Pharmacy Consult for Heparin Indication: chest pain/ACS  No Known Allergies  Patient Measurements: Height: 6' (182.9 cm) Weight: 177 lb 0.5 oz (80.3 kg) IBW/kg (Calculated) : 77.6  Vital Signs: Temp: 98.6 F (37 C) (03/11 2100) Temp Source: Oral (03/11 2100) BP: 119/66 (03/11 2100) Pulse Rate: 64 (03/11 2100)  Labs: Recent Labs    11/08/17 1752  11/09/17 0227 11/09/17 0851 11/09/17 1404 11/09/17 2107  HGB 16.3  --  15.2 14.8  --   --   HCT 48.0  --  44.9 44.4  --   --   PLT  --   --  213 195  --   --   HEPARINUNFRC  --   --   --   --  0.31 0.21*  CREATININE 0.90  --  0.70  0.72  --   --   --   TROPONINI  --    < > 40.96* 53.02* 48.19*  --    < > = values in this interval not displayed.    Estimated Creatinine Clearance: 93 mL/min (by C-G formula based on SCr of 0.7 mg/dL).   Assessment: 72 year old post CPR with STEMI, now s/p cath awaiting possible CABG.    Repeat heparin level came back at 0.21, subtherapeutic, on 1000 units/hr. Hgb remains stable at 14.8, plts at 195. No signs/symptoms of bleeding. No infusion issues per nursing.    Goal of Therapy:  Heparin level = 0.3 to 0.5 (with Aggrastat) Monitor platelets by anticoagulation protocol: Yes   Plan:  Increase heparin to 1150 units/hr Obtain heparin level in 8 hours  Daily heparin level, CBC  Aggrastat to continue until time of CABG.  Doylene Canard, PharmD Clinical Pharmacist  Pager: [phone] Phone: [phone]  11/09/2017 10:19 PM
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
## 2017-11-10 ENCOUNTER — Other Ambulatory Visit: Payer: Self-pay

## 2017-11-10 ENCOUNTER — Inpatient Hospital Stay: Payer: Self-pay

## 2017-11-10 ENCOUNTER — Encounter (HOSPITAL_COMMUNITY): Payer: Self-pay

## 2017-11-10 DIAGNOSIS — J9600 Acute respiratory failure, unspecified whether with hypoxia or hypercapnia: Secondary | ICD-10-CM

## 2017-11-10 DIAGNOSIS — I2510 Atherosclerotic heart disease of native coronary artery without angina pectoris: Secondary | ICD-10-CM

## 2017-11-10 DIAGNOSIS — E785 Hyperlipidemia, unspecified: Secondary | ICD-10-CM

## 2017-11-10 DIAGNOSIS — I255 Ischemic cardiomyopathy: Secondary | ICD-10-CM

## 2017-11-10 DIAGNOSIS — R0902 Hypoxemia: Secondary | ICD-10-CM

## 2017-11-10 LAB — GLUCOSE, CAPILLARY
GLUCOSE-CAPILLARY: 121 mg/dL — AB (ref 65–99)
GLUCOSE-CAPILLARY: 121 mg/dL — AB (ref 65–99)
GLUCOSE-CAPILLARY: 128 mg/dL — AB (ref 65–99)
Glucose-Capillary: 112 mg/dL — ABNORMAL HIGH (ref 65–99)
Glucose-Capillary: 99 mg/dL (ref 65–99)
Glucose-Capillary: 124 mg/dL — ABNORMAL HIGH (ref 65–99)

## 2017-11-10 LAB — BASIC METABOLIC PANEL
ANION GAP: 10 (ref 5–15)
ANION GAP: 8 (ref 5–15)
BUN: 18 mg/dL (ref 6–20)
BUN: 16 mg/dL (ref 6–20)
CHLORIDE: 109 mmol/L (ref 101–111)
CHLORIDE: 105 mmol/L (ref 101–111)
CO2: 22 mmol/L (ref 22–32)
CO2: 24 mmol/L (ref 22–32)
CREATININE: 0.66 mg/dL (ref 0.61–1.24)
Calcium: 8.3 mg/dL — ABNORMAL LOW (ref 8.9–10.3)
Calcium: 8.2 mg/dL — ABNORMAL LOW (ref 8.9–10.3)
Creatinine, Ser: 0.71 mg/dL (ref 0.61–1.24)
GFR calc non Af Amer: >60 mL/min (ref >60)
GFR calc non Af Amer: >60 mL/min (ref >60)
Glucose, Bld: 119 mg/dL — ABNORMAL HIGH (ref 65–99)
Glucose, Bld: 136 mg/dL — ABNORMAL HIGH (ref 65–99)
POTASSIUM: 4 mmol/L (ref 3.5–5.1)
POTASSIUM: 3.8 mmol/L (ref 3.5–5.1)
SODIUM: 141 mmol/L (ref 135–145)
Sodium: 137 mmol/L (ref 135–145)

## 2017-11-10 LAB — CBC
HCT: 41.3 % (ref 39.0–52.0)
Hemoglobin: 14.2 g/dL (ref 13.0–17.0)
MCH: 30.5 pg (ref 26.0–34.0)
MCHC: 34.4 g/dL (ref 30.0–36.0)
MCV: 88.8 fL (ref 78.0–100.0)
Platelets: 153 10*3/uL (ref 150–400)
RBC: 4.65 MIL/uL (ref 4.22–5.81)
RDW: 14.3 % (ref 11.5–15.5)
WBC: 8.6 10*3/uL (ref 4.0–10.5)

## 2017-11-10 LAB — HEPARIN LEVEL (UNFRACTIONATED)
HEPARIN UNFRACTIONATED: 0.26 [IU]/mL — AB (ref 0.30–0.70)
Heparin Unfractionated: 0.33 IU/mL (ref 0.30–0.70)

## 2017-11-10 LAB — POCT I-STAT 3, ART BLOOD GAS (G3+)
ACID-BASE DEFICIT: 2 mmol/L (ref 0.0–2.0)
Bicarbonate: 22.6 mmol/L (ref 20.0–28.0)
O2 Saturation: 98 %
PH ART: 7.384 (ref 7.350–7.450)
PO2 ART: 98 mmHg (ref 83.0–108.0)
TCO2: 24 mmol/L (ref 22–32)
pCO2 arterial: 37.9 mmHg (ref 32.0–48.0)

## 2017-11-10 LAB — LIPID PANEL
CHOLESTEROL: 146 mg/dL (ref 0–200)
HDL: 40 mg/dL — AB (ref >40)
LDL Cholesterol: 91 mg/dL (ref 0–99)
TRIGLYCERIDES: 75 mg/dL (ref <150)
Total CHOL/HDL Ratio: 3.7 RATIO
VLDL: 15 mg/dL (ref 0–40)

## 2017-11-10 LAB — POCT ACTIVATED CLOTTING TIME: ACTIVATED CLOTTING TIME: 92 s

## 2017-11-10 LAB — HEPATIC FUNCTION PANEL
ALBUMIN: 3.1 g/dL — AB (ref 3.5–5.0)
ALT: 84 U/L — ABNORMAL HIGH (ref 17–63)
AST: 154 U/L — AB (ref 15–41)
Alkaline Phosphatase: 53 U/L (ref 38–126)
Bilirubin, Direct: 0.2 mg/dL (ref 0.1–0.5)
Indirect Bilirubin: 1.2 mg/dL — ABNORMAL HIGH (ref 0.3–0.9)
TOTAL PROTEIN: 4.9 g/dL — AB (ref 6.5–8.1)
Total Bilirubin: 1.4 mg/dL — ABNORMAL HIGH (ref 0.3–1.2)

## 2017-11-10 LAB — PROCALCITONIN: Procalcitonin: <0.1 ng/mL

## 2017-11-10 LAB — MAGNESIUM: MAGNESIUM: 1.8 mg/dL (ref 1.7–2.4)

## 2017-11-10 LAB — TROPONIN I
Troponin I: 26.25 ng/mL (ref <0.03)
Troponin I: 23.89 ng/mL (ref <0.03)
Troponin I: 20.01 ng/mL (ref <0.03)

## 2017-11-10 LAB — PHOSPHORUS: PHOSPHORUS: 2.1 mg/dL — AB (ref 2.5–4.6)

## 2017-11-10 MED ORDER — POTASSIUM PHOSPHATES 15 MMOLE/5ML IV SOLN
20.0000 mmol | Freq: Once | INTRAVENOUS | Status: AC
  Administered 2017-11-10: 20 mmol via INTRAVENOUS
  Filled 2017-11-10: qty 6.67

## 2017-11-10 MED ORDER — BISACODYL 5 MG PO TBEC
5.0000 mg | DELAYED_RELEASE_TABLET | Freq: Every day | ORAL | Status: DC | PRN
  Filled 2017-11-10: qty 1

## 2017-11-10 MED ORDER — MAGNESIUM SULFATE IN D5W 1-5 GM/100ML-% IV SOLN
1.0000 g | Freq: Once | INTRAVENOUS | Status: AC
  Administered 2017-11-10: 1 g via INTRAVENOUS
  Filled 2017-11-10: qty 100

## 2017-11-10 MED ORDER — PNEUMOCOCCAL VAC POLYVALENT 25 MCG/0.5ML IJ INJ: 0.5000 mL | INJECTION | INTRAMUSCULAR | Status: DC

## 2017-11-10 MED ORDER — DOCUSATE SODIUM 100 MG PO CAPS
100.0000 mg | ORAL_CAPSULE | Freq: Two times a day (BID) | ORAL | Status: DC | PRN
  Administered 2017-11-10: 100 mg via ORAL
  Filled 2017-11-10: qty 1

## 2017-11-10 MED ORDER — TAMSULOSIN HCL 0.4 MG PO CAPS
0.4000 mg | ORAL_CAPSULE | Freq: Every day | ORAL | Status: DC
  Administered 2017-11-10 – 2017-11-16 (×7): 0.4 mg via ORAL
  Filled 2017-11-10 (×7): qty 1

## 2017-11-10 MED ORDER — INFLUENZA VAC SPLIT HIGH-DOSE 0.5 ML IM SUSY
0.5000 mL | PREFILLED_SYRINGE | INTRAMUSCULAR | Status: DC
  Filled 2017-11-10: qty 0.5

## 2017-11-10 NOTE — Progress Notes (Signed)
ANTICOAGULATION CONSULT NOTE  Pharmacy Consult for Heparin Indication: chest pain/ACS  No Known Allergies  Patient Measurements: Height: 6' (182.9 cm) Weight: 177 lb 4 oz (80.4 kg) IBW/kg (Calculated) : 77.6  Vital Signs: Temp: 98.1 F (36.7 C) (03/12 1159) Temp Source: Oral (03/12 1159) BP: 117/71 (03/12 1300) Pulse Rate: 61 (03/12 1300)  Labs: Recent Labs    11/08/17 1752  11/09/17 0227 11/09/17 0851  11/09/17 1404 11/09/17 2107 11/10/17 0550 11/10/17 0806 11/10/17 1242  HGB 16.3  --  15.2 14.8  --   --   --  14.2  --   --   HCT 48.0  --  44.9 44.4  --   --   --  41.3  --   --   PLT  --   --  213 195  --   --   --  153  --   --   HEPARINUNFRC  --   --   --   --    < > 0.31 0.21* 0.26*  --  0.33  CREATININE 0.90  --  0.70  0.72  --   --   --   --  0.71  --   --   TROPONINI  --    < > 40.96* 53.02*  --  48.19*  --   --  26.25* 23.89*   < > = values in this interval not displayed.    Estimated Creatinine Clearance: 93 mL/min (by C-G formula based on SCr of 0.71 mg/dL).   Assessment: 72 year old post CPR with STEMI, now s/p cath awaiting possible CABG.    Heparin level now therapeutic on 1250 units/hr. CBC remains stable. No signs/symptoms of bleeding. No infusion issues per nursing.    Tirofiban turned off this AM by cards.  Goal of Therapy:  Heparin level = 0.3 to 0.5 (with Aggrastat) Monitor platelets by anticoagulation protocol: Yes   Plan:  Continue IV heparin at 1250 units/hr. Daily heparin level, CBC  Uvaldo Rising, BCPS  Clinical Pharmacist Pager [phone]  11/10/2017 1:47 PM

## 2017-11-10 NOTE — Consult Note (Addendum)
Advanced Heart Failure Team Consult Note   Primary Physician: No primary care provider on file. PCP-Cardiologist:  Shelva Majestic, MD  Reason for Consultation: STEMI, Acute systolic CHF, ICM  HPI:    Edwin Morris is seen today for evaluation of acute systolic CHF, ICM, and STEMI at the request of Dr. Servando Snare.   Edwin Morris is a 72 y.o. male with history of HLD,NHL and BPH. Not previously followed by a cardiologist in the community.   He is a [place] Vet who was awarded a [place]. 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   Diagnosed with NHL in 2009 and treated with chemotherapy (no XRT) by Dr. Julien Nordmann .  PRIOR THERAPY:  1. Status post 6 cycle of systemic chemotherapy with CHOP/Rituxan with Neulasta support. Last dose of chemotherapy was given March 27, 2009 and the patient refused to continue with the last 2 cycles of his treatment. 2. Maintenance Rituxan 375 mg per meter square given every 2 months. He is status post 3 cycles, again discontinued in July 2011.  Pt was his USOH until afternoon of 11/08/17. He was raking his yard when he developed chest tightness and pressure.  His wife gave him an ASA and called EMS.  In transit to hospital, pt developed VF arrest requiring defibrillation and transient CPR. Intubated on arrival to [facility] for airway protection. ECG showed mild ST elevated in leads III, aVF, as well as V3 and V4 with RBBB. He was taken emergently for catheterization which showed severe multivessel disease as below. He was started on IV amiodarone for VF/VT, statin increased to high intensity, and started on heparin gtt with ACS. Pertinent labs on admission include K 2.7, Cr 0.90, Hgb 16.3, and troponin up to a peak of 53.02. CXR showed no acute cardiopulmonary disease.    Pt extubated 11/09/17. CVTS consulted 11/09/17 and plan for CABG Friday, 11/13/17.   He is feeling OK today, just tired. He denies chest pain.  No lightheadedness or dizziness. He states he was  previously treated for Stage 4 lymphoma, in the lymph nodes of his groin, abdomen, and neck. Underwent chemotherapy as above. No chest XRT.  PTA he denies any SOB with exertion, lightheadedness, dizziness, peripheral edema, orthopnea, or PND.  He works as a Development worker, community, and continued to work full time up until Saturday. He has a distant history of smoking, while he was in [place].   Echo 11/10/17 40-45%, Grade 1 DD, Trivial AS, Trivial MR, Mod LAE, Trivial TR, PA peak pressure 25 mm Hg.  LHC 11/08/17   Prox LAD lesion is 85% stenosed.  Ost 1st Diag lesion is 85% stenosed.  Ost 1st Sept lesion is 95% stenosed.  Mid LAD lesion is 85% stenosed.  Ost 1st Diag to 1st Diag lesion is 70% stenosed.  Dist LM to Ost LAD lesion is 60% stenosed.  Ost Cx lesion is 65% stenosed.  Prox Cx lesion is 50% stenosed.  Mid Cx lesion is 50% stenosed.  Mid RCA lesion is 60% stenosed.  Prox RCA to Mid RCA lesion is 70% stenosed.  Dist RCA lesion is 90% stenosed.  LV end diastolic pressure is normal.  There is moderate to severe left ventricular systolic dysfunction.  Review of Systems: [y] = yes, [ ]  = no   General: Weight gain [ ] ; Weight loss [ ] ; Anorexia [ ] ; Fatigue [ ] ; Fever [ ] ; Chills [ ] ; Weakness [y]  Cardiac: Chest pain/pressure [y]; Resting SOB [ ] ; Exertional SOB [ ] ;  Orthopnea [ ] ; Pedal Edema [ ] ; Palpitations [ ] ; Syncope [ ] ; Presyncope [ ] ; Paroxysmal nocturnal dyspnea[ ]   Pulmonary: Cough [ ] ; Wheezing[ ] ; Hemoptysis[ ] ; Sputum [ ] ; Snoring [ ]   GI: Vomiting[ ] ; Dysphagia[ ] ; Melena[ ] ; Hematochezia [ ] ; Heartburn[ ] ; Abdominal pain [ ] ; Constipation [ ] ; Diarrhea [ ] ; BRBPR [ ]   GU: Hematuria[ ] ; Dysuria [ ] ; Nocturia[ ]   Vascular: Pain in legs with walking [ ] ; Pain in feet with lying flat [ ] ; Non-healing sores [ ] ; Stroke [ ] ; TIA [ ] ; Slurred speech [ ] ;  Neuro: Headaches[ ] ; Vertigo[ ] ; Seizures[ ] ; Paresthesias[ ] ;Blurred vision [ ] ; Diplopia [ ] ; Vision changes [ ]     Ortho/Skin: Arthritis [y]; Joint pain [y]; Muscle pain [ ] ; Joint swelling [ ] ; Back Pain [ ] ; Rash [ ]   Psych: Depression[ ] ; Anxiety[ ]   Heme: Bleeding problems [ ] ; Clotting disorders [ ] ; Anemia [ ]   Endocrine: Diabetes [ ] ; Thyroid dysfunction[ ]   Home Medications Prior to Admission medications   Medication Sig Start Date End Date Taking? Authorizing Provider  atorvastatin (LIPITOR) 40 MG tablet Take 40 mg by mouth daily.   Yes [provider]  finasteride (PROSCAR) 5 MG tablet Take 5 mg by mouth daily.   Yes [provider]  naproxen sodium (ALEVE) 220 MG tablet Take 220 mg by mouth daily.   Yes [provider]  vitamin E (VITAMIN E) 200 UNIT capsule Take 180 Units by mouth daily.   Yes [provider]    Past Medical History: Hyperlipidemia BPH Lymphoma  Past Surgical History: L/RHC 11/08/17  Family History: No immediate family member with heart disease.  Had a distance cousin with early heart disease at 38.  Had a maternal uncle and a paternal uncle who both had CAD.   Social History: Social History   Socioeconomic History  . Marital status: Married    Spouse name: Not on file  . Number of children: Not on file  . Years of education: Not on file  . Highest education level: Not on file  Social Needs  . Financial resource strain: Not on file  . Food insecurity - worry: Not on file  . Food insecurity - inability: Not on file  . Transportation needs - medical: Not on file  . Transportation needs - non-medical: Not on file  Occupational History  . Not on file  Tobacco Use  . Smoking status: Not on file  Substance and Sexual Activity  . Alcohol use: Not on file  . Drug use: Not on file  . Sexual activity: Not on file  Other Topics Concern  . Not on file  Social History Narrative  . Not on file    Allergies:  No Known Allergies  Objective:    Vital Signs:   Temp:  [98 F (36.7 C)-98.7 F (37.1 C)] 98.1 F (36.7 C)  (03/12 1159) Pulse Rate:  [55-71] 63 (03/12 1500) Resp:  [9-25] 19 (03/12 1500) BP: (104-126)/(54-80) 125/75 (03/12 1500) SpO2:  [96 %-100 %] 98 % (03/12 1500) Weight:  [177 lb 4 oz (80.4 kg)] 177 lb 4 oz (80.4 kg) (03/12 0500) Last BM Date: 11/07/17  Weight change: Filed Weights   11/08/17 2130 11/09/17 0426 11/10/17 0500  Weight: 169 lb 1.5 oz (76.7 kg) 177 lb 0.5 oz (80.3 kg) 177 lb 4 oz (80.4 kg)    Intake/Output:   Intake/Output Summary (Last 24 hours) at 11/10/2017 1511 Last data filed  at 11/10/2017 1500 Gross per 24 hour  Intake 2954.88 ml  Output 957 ml  Net 1997.88 ml      Physical Exam    General:  Fatigued appearing. No resp difficulty HEENT: normal anicteric Neck: supple. JVP 6-7 cm. Carotids 2+ bilat; no bruits. No lymphadenopathy or thyromegaly appreciated. Cor: PMI nondisplaced. Regular rate & rhythm. No rubs, gallops or murmurs. Lungs: Clear. No wheeze Abdomen: soft, nontender, nondistended. No hepatosplenomegaly. No bruits or masses. Good bowel sounds. Extremities: no cyanosis, clubbing, rash, or edema. Neuro: alert & orientedx3, cranial nerves grossly intact. moves all 4 extremities w/o difficulty. Affect flat but appropriate.    Telemetry   NSR 70s with occasional PVCs, personally reviewed.  EKG    11/09/17 NSR 72 bpm with PVCs , personally reviewed  Labs   Basic Metabolic Panel: Recent Labs  Lab 11/08/17 1752 11/09/17 0227 11/10/17 0550  NA 142 140  140 141  K 2.7* 4.1  4.0 4.0  CL 105 111  110 109  CO2  --  18*  19* 22  GLUCOSE 257* 143*  144* 119*  BUN 27* 24*  23* 18  CREATININE 0.90 0.70  0.72 0.71  CALCIUM  --  8.1*  8.2* 8.3*  MG  --  1.8 1.8  PHOS  --  3.2 2.1*   Liver Function Tests: Recent Labs  Lab 11/10/17 0550  AST 154*  ALT 84*  ALKPHOS 53  BILITOT 1.4*  PROT 4.9*  ALBUMIN 3.1*   No results for input(s): LIPASE, AMYLASE in the last 168 hours. No results for input(s): AMMONIA in the last 168  hours.  CBC: Recent Labs  Lab 11/08/17 1752 11/09/17 0227 11/09/17 0851 11/10/17 0550  WBC  --  11.9* 11.2* 8.6  HGB 16.3 15.2 14.8 14.2  HCT 48.0 44.9 44.4 41.3  MCV  --  87.4 87.7 88.8  PLT  --  213 195 153   Cardiac Enzymes: Recent Labs  Lab 11/09/17 0227 11/09/17 0851 11/09/17 1404 11/10/17 0806 11/10/17 1242  TROPONINI 40.96* 53.02* 48.19* 26.25* 23.89*   BNP: BNP (last 3 results) No results for input(s): BNP in the last 8760 hours.  ProBNP (last 3 results) No results for input(s): PROBNP in the last 8760 hours.  CBG: Recent Labs  Lab 11/09/17 2010 11/09/17 2344 11/10/17 0304 11/10/17 0757 11/10/17 1157  GLUCAP 130* 106* 112* 99 124*   Coagulation Studies: No results for input(s): LABPROT, INR in the last 72 hours.  Imaging   [place] Ekg Site Rite  Result Date: 11/10/2017 If Site Rite image not attached, placement could not be confirmed due to current cardiac rhythm.    Medications:    Current Medications: . aspirin  81 mg Oral Daily  . atorvastatin  80 mg Oral q1800  . insulin aspart  0-9 Units Subcutaneous Q4H  . pantoprazole (PROTONIX) IV  40 mg Intravenous QHS  . sodium chloride flush  3 mL Intravenous Q12H     Infusions: . sodium chloride 250 mL (11/10/17 1500)  . sodium chloride 250 mL (11/10/17 0357)  . heparin 1,250 Units/hr (11/10/17 1500)  . potassium PHOSPHATE IVPB (mmol) 20 mmol (11/10/17 0945)       Patient Profile   Edwin Morris is a 72 y.o. male S/P vfib cardiac arrest with STEMI. Cath showed multivessel ASCAD with 60% ostial LAD, complex trifurcation lesion at prox LAD/SP/D1 of 85-90 and 95% and diffuse 85% in mid LAD, 70% D1, 60-80% ostial LCx, 40-50% OM in co-dominant LCx  and 90% distal RCA and 60-70% mid RCA with EF 35%.   CVTS consulted and plan for CABG Friday, 11/13/17.  Assessment/Plan   1.Cardiac arrest [facility]) in setting of STEMI, as below.  - S/p Vfib arrest - Maintaining NSR w/ occasional NSVT on amio.   - Continue amio gtt at 30/mg hr  2. CAD s/p STEMI   - LHC 11/08/17 with 60% ostial LAD, complex trifurcation lesion at prox LAD/SP/D1 of 85-90 and 95% and diffuse 85% in mid LAD, 70% D1, 60-80% ostial LCx, 40-50% OM in co-dominant LCx and 90% distal RCA and 60-70% mid RCA with EF 35%. - CVTS following. Plan for CABG Friday, 11/13/17. - On heparin gtt.  - Continue ASA and statin  3. Acute systolic CHF, ischemic CMP - Echo 11/10/17 40-45%, Grade 1 DD, Trivial AS, Trivial MR, Mod LAE, Trivial TR, PA peak pressure 25 mm Hg.  - Volume status stable on exam. No need for lasix at this time.  - Low utility to add ACE/ARB prior to CABG, so will follow BP closely.  - Could consider spiro. Consider spiro.  - No BB due to bradycardia.  - We will continue to follow up to and post CABG.   4.Acute respiratory failure ([place]) - Extubated 11/09/17.   5.Hyperlipidemia LDL goal <70 - LDL 91  - On high dose statin - Check Hgb A1C.   6. Hypokalemia - 2.7 on admit.   - Now much improved with supp.  - Keep K > 4.0 and Mg > 2.0 with VF/VT.  - Pt received Mg supp today.   7. H/o Non-Hodgkins lymphoma 2009 - treated with chemo. No XRT  Pt relatively stable. No indication for diuresis at this time. Will gently adjust medicines as needed.  Family has requested PICC line for frequent sticks.  PICC line attempted, but would not thread.  Will attempt to limit sticks to mornings for the time being. Suspect patient would be stable to go to IR if needed.   HF team will follow along.  Medication concerns reviewed with patient and pharmacy team. Barriers identified: None at this time.  Length of Stay: 2  Annamaria Helling  11/10/2017, 3:11 PM  Advanced Heart Failure Team Pager [phone] (M-F; 7a - 4p)  Please contact [facility] for night-coverage after hours (4p -7a ) and weekends on [email or web address]  Patient seen and examined with the above-signed Advanced Practice Provider and/or Housestaff.  I personally reviewed laboratory data, imaging studies and relevant notes. I independently examined the patient and formulated the important aspects of the plan. I have edited the note to reflect any of my changes or salient points. I have personally discussed the plan with the patient and/or family.  72 y/o male as above with h/o HL and NHL admitted with VF arrest and severe 3vCAD, Now extubated. Cath and echo images reviewed personally. EF 40%. Currently no HF or anginal symptoms. He is on ASA, heparin and statin. Would have low threshold to add b-blocker as HR tolerates. Will ambulate. Follow electrolytes closely. Long discussion about role of CABG for revascularization and how much EF may or may not recover.   The HF team will continue to follow to optimize. In looking at his echo, I do not think he needs RHC prior to surgery. He has h/o NHL but did not have chest XRT.   Glori Bickers, MD  10:30 PM

## 2017-11-10 NOTE — Progress Notes (Addendum)
PULMONARY / CRITICAL CARE MEDICINE   Name: Makai Kydan Shanholtzer MRN: [id number] DOB: Dec 01, 1945    ADMISSION DATE:  11/08/2017 CONSULTATION DATE:  11/09/2017  REFERRING MD:  Dr. Claiborne Billings   CHIEF COMPLAINT:  Cardiac Arrest   HISTORY OF PRESENT ILLNESS:   72 year old male with HLD and BPH  Presented to ED on 3/10 after calling EMS for Chest Pain. On the way to ED patient experienced a VF Arrest for 15-20 minutes. Intubated on arrival to ED. Taken to cath lab, found to have severe 3 vessel disease. Consulted CT surgery for possible CABG. PCCM asked to consult for vent management.    SUBJECTIVE:   Extubated 03/11, doing well on 2L [place] Patient reports ongoing nausea unresponsive to zofran since extubation.  Cards stopping amio gtt this am as possible etiology of nausea  VITAL SIGNS: BP 107/65 (BP Location: Left Arm)   Pulse (!) 55   Temp 98.3 F (36.8 C) (Oral)   Resp 15   Ht 6' (1.829 m)   Wt 177 lb 4 oz (80.4 kg)   SpO2 98%   BMI 24.04 kg/m   HEMODYNAMICS:    VENTILATOR SETTINGS: FiO2 (%):  [40 %] 40 %  INTAKE / OUTPUT: I/O last 3 completed shifts: In: 5910.7 [P.O.:60; I.V.:5700.7; IV Piggyback:150] Out: 2782 [Urine:2380; Emesis/NG output:402]  PHYSICAL EXAMINATION: General:  Adult male lying in bed in NAD HEENT: MM pink/moist Neuro: Awake, oriented, MAE CV: RR, 2+ pulses PULM: even/non-labored, lungs bilaterally clear DV:VOHY, non-tender, bs hypoactive  Extremities: warm/dry, no edema  Skin: no rashes  LABS:  BMET Recent Labs  Lab 11/08/17 1752 11/09/17 0227 11/10/17 0550  NA 142 140  140 141  K 2.7* 4.1  4.0 4.0  CL 105 111  110 109  CO2  --  18*  19* 22  BUN 27* 24*  23* 18  CREATININE 0.90 0.70  0.72 0.71  GLUCOSE 257* 143*  144* 119*    Electrolytes Recent Labs  Lab 11/09/17 0227 11/10/17 0550  CALCIUM 8.1*  8.2* 8.3*  MG 1.8 1.8  PHOS 3.2 2.1*    CBC Recent Labs  Lab 11/09/17 0227 11/09/17 0851 11/10/17 0550  WBC 11.9* 11.2* 8.6   HGB 15.2 14.8 14.2  HCT 44.9 44.4 41.3  PLT 213 195 153    Coag's No results for input(s): APTT, INR in the last 168 hours.  Sepsis Markers Recent Labs  Lab 11/08/17 1753 11/09/17 0227  LATICACIDVEN 8.99* 1.5  PROCALCITON  --  <0.10    ABG Recent Labs  Lab 11/08/17 2045 11/09/17 0330 11/10/17 0356  PHART 7.311* 7.333* 7.384  PCO2ART 42.1 39.4 37.9  PO2ART 366* 152* 98.0    Liver Enzymes Recent Labs  Lab 11/10/17 0550  AST 154*  ALT 84*  ALKPHOS 53  BILITOT 1.4*  ALBUMIN 3.1*    Cardiac Enzymes Recent Labs  Lab 11/09/17 0227 11/09/17 0851 11/09/17 1404  TROPONINI 40.96* 53.02* 48.19*    Glucose Recent Labs  Lab 11/09/17 1157 11/09/17 1626 11/09/17 2010 11/09/17 2344 11/10/17 0304 11/10/17 0757  GLUCAP 142* 117* 130* 106* 112* 99    Imaging No results found.   STUDIES:  CXR 3/10 > no acute process Cath 3/10 > Prox LAD lesion is 85% stenosed.  Ost 1st Diag lesion is 85% stenosed.  Ost 1st Sept lesion is 95% stenosed.  Mid LAD lesion is 85% stenosed.  Ost 1st Diag to 1st Diag lesion is 70% stenosed.  Dist LM to [facility] LAD  lesion is 60% stenosed.  Ost Cx lesion is 65% stenosed.  Prox Cx lesion is 50% stenosed.  Mid Cx lesion is 50% stenosed.  Mid RCA lesion is 60% stenosed.  Prox RCA to Mid RCA lesion is 70% stenosed.  Dist RCA lesion is 90% stenosed.  LV end diastolic pressure is normal. There is moderate to severe left ventricular systolic dysfunction.  CULTURES: 3/10 MRSA PCR >> neg 3/11 BC x 2 >> 3/11 Trach asp >>   ANTIBIOTICS: None.   SIGNIFICANT EVENTS: 3/10 > Presents to ED   LINES/TUBES: ETT 3/10 >>  DISCUSSION: 72 year old male presents to ED with reported chest pain and 15-20 minutes VF arrest. Taken to Cath Lab, found to have 3v disease. CVTS consulted with probable CABG during this admission.  Extubated 3/11 to 2L [place]  ASSESSMENT / PLAN:  PULMONARY A: Respiratory Insufficieny in setting of STEMI -  resolved P:   Wean supplemental O2 for sats > 94% Pulmonary Hygiene  Trend ABG/CXR as needed  CARDIOVASCULAR A:  STEMI/VF Arrest (15-20 minutes)  Cath > 3 Vessel Disease, Severe LV dysfunction EF 35% Right Bundle Branch Block  H/O HLD  P:  Per Cardiology > CT Surgery consulted for probable CABG this admission  Amio stopped per Cards due to severe nausea Continue heparin, ASA, lipitor per cards aggrastat d/c'd 3/12  RENAL A:   Lactic Acidosis in setting of hypoperfusion - resolved P:   Trend BMP  Replace electrolytes as indicated    GASTROINTESTINAL A:   Nausea -?from amio P:   Advance diet to clear liquids Prn zofran, - monitoring QTc will all antiemetics Prn colace and dulcolax   HEMATOLOGIC A:   Anticoagulation needs  P:  Currently on Heparin gtt Trend CBC   INFECTIOUS A:   No indication of infection P:   Trend WBC and Fever Curve   ENDOCRINE A:   No acute issues    P:   No interventions required.  NEUROLOGIC A:   Sedation Needs due to mechanical ventilation.- resolved P:   Monitor   FAMILY  - Updates: Patient and wife updated at bedside 3/12.  PCCM will sign off.  Please do not hesitate to call us back if we can be of any further assistance.  Kennieth Rad, AGACNP-BC [facility] Pgr: [phone] or if no answer [phone] 11/10/2017, 8:40 AM  Attending Note:  72 year old male s/p cardiac arrest, found to have multivessel disease who presents to [facility] for vent management.  Patient was not going to be taken emergently to the OR for CABG so was extubated on 3/11.  On exam, lungs are clear and patient is following commands.  I reviewed CXR myself, no acute disease noted.  Discussed with PCCM-NP.  Acute respiratory failure:  - Monitor for airway protection post arrest  Hypoxemia  - Titrate O2 for sat of 88-92%  CAD:  - ASA  - Aggrenox to heparin  - CVTS consulted, CABG this admission at one point  Sedation:  - D/C all  sedating medications since extubated.  N/V  - D/C amio  - Zofran as needed  Metabolic acidosis:  - Resolved  - BMET in AM  - Replace electrolytes.  PCCM will sign off, please call back if needed.  Patient seen and examined, agree with above note.  I dictated the care and orders written for this patient under my direction.  Rush Farmer, M.D. [facility] Pulmonary/Critical Care Medicine. Pager: [phone]. After hours pager:  [phone]. 

## 2017-11-10 NOTE — Evaluation (Signed)
Physical Therapy Evaluation Patient Details Name: Edwin Morris MRN: [id number] DOB: 09-06-1945 Today's Date: 11/10/2017   History of Present Illness  Pt is a 72 y.o. male admitted 11/08/17 with c/o chest pain in setting of STEMI; during EMS transport, developed VF arrest and resceived CPR and defibrillation with restoration of sinus rhythm. Intubated in ER; extubated 3/11. S/p emergent cath revealing severe multivessel disease. Plan for CABG on 3/15. PMH includes CAD, CHF, BPH.    Clinical Impression  Pt presents with an overall decrease in functional mobility secondary to above. 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 Potential for CABG this Friday 3/15, therefore educ on sternal precautions and practiced mobility while maintaining this. Pt currently indep with bed mobility and transfers while maintaining these precautions. Declined ambulation secondary to c/o nausea; does report feeling weak; BLE strength 5/5. Max encouragement on importance of mobility, especially post-CABG. Will follow acutely to determine needs, but do not expect pt to require follow-up PT.    Follow Up Recommendations No PT follow up    Equipment Recommendations  (TBD post-op)    Recommendations for Other Services       Precautions / Restrictions Precautions Precautions: None Restrictions Weight Bearing Restrictions: No      Mobility  Bed Mobility Overal bed mobility: Independent             General bed mobility comments: Practiced log roll technique in preparation for sternal precautions; pt indep with this for sup<>sit  Transfers Overall transfer level: Independent Equipment used: None             General transfer comment: Able to stand using bilat hands on knees in preparation for standing with sternal precautions. Indep with this  Ambulation/Gait             General Gait Details: Able to perform pre-gait and steps at bedside independently;  declining further mobility due to nausea  Stairs            Wheelchair Mobility    Modified Rankin (Stroke Patients Only)       Balance Overall balance assessment: Needs assistance   Sitting balance-Leahy Scale: Good       Standing balance-Leahy Scale: Good                               Pertinent Vitals/Pain Pain Assessment: Faces Faces Pain Scale: Hurts a little bit Pain Location: Stomach/nausea Pain Descriptors / Indicators: Cramping Pain Intervention(s): Monitored during session;Limited activity within patient's tolerance    Home Living Family/patient expects to be discharged to:: Private residence Living Arrangements: Spouse/significant other Available Help at Discharge: Family;Available 24 hours/day Type of Home: House Home Access: Stairs to enter Entrance Stairs-Rails: Right Entrance Stairs-Number of Steps: 3 Home Layout: One level Home Equipment: Walker - 2 wheels;Shower seat Additional Comments: Wife available for 24/7 support    Prior Function Level of Independence: Independent         Comments: Works as Development worker, community. Drives. Active lifestyle     Hand Dominance        Extremity/Trunk Assessment   Upper Extremity Assessment Upper Extremity Assessment: Overall WFL for tasks assessed    Lower Extremity Assessment Lower Extremity Assessment: Overall WFL for tasks assessed       Communication   Communication: No difficulties  Cognition Arousal/Alertness: Awake/alert Behavior During Therapy: WFL for tasks assessed/performed Overall Cognitive Status: Within Functional Limits for  tasks assessed                                        General Comments General comments (skin integrity, edema, etc.): Wife present during session    Exercises     Assessment/Plan    PT Assessment Patient needs continued PT services  PT Problem List Decreased activity tolerance;Decreased mobility;Decreased knowledge of use of  DME;Cardiopulmonary status limiting activity       PT Treatment Interventions DME instruction;Gait training;Stair training;Functional mobility training;Therapeutic activities;Therapeutic exercise;Balance training;Patient/family education    PT Goals (Current goals can be found in the Care Plan section)  Acute Rehab PT Goals Patient Stated Goal: Return home PT Goal Formulation: With patient Time For Goal Achievement: 11/24/17 Potential to Achieve Goals: Good    Frequency Min 3X/week   Barriers to discharge        Co-evaluation               AM-PAC PT "6 Clicks" Daily Activity  Outcome Measure Difficulty turning over in bed (including adjusting bedclothes, sheets and blankets)?: None Difficulty moving from lying on back to sitting on the side of the bed? : None Difficulty sitting down on and standing up from a chair with arms (e.g., wheelchair, bedside commode, etc,.)?: None Help needed moving to and from a bed to chair (including a wheelchair)?: None Help needed walking in hospital room?: A Little Help needed climbing 3-5 steps with a railing? : A Little 6 Click Score: 22    End of Session Equipment Utilized During Treatment: Gait belt Activity Tolerance: Patient limited by pain(Nausea) Patient left: in bed;with call bell/phone within reach;with bed alarm set;with family/visitor present Nurse Communication: Mobility status PT Visit Diagnosis: Other abnormalities of gait and mobility (R26.89)    Time: 0998-3382 PT Time Calculation (min) (ACUTE ONLY): 20 min   Charges:   PT Evaluation $PT Eval Moderate Complexity: 1 Mod     PT G Codes:       Mabeline Caras, PT, DPT Acute Rehab Services  Pager: [place] 11/10/2017, 6:04 PM
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
## 2017-11-10 NOTE — Progress Notes (Signed)
Attempted to place PICC in right upper forearm in the basilic, cephalic and the brachial veins without success. Unable to thread guidewire pass the axillary. RN and wife made aware.

## 2017-11-10 NOTE — Significant Event (Signed)
Late entry documentation.   Wasted approximately 220cc of fentanyl in sink and flushed with another RN Para March as witness.     Alexa Blish

## 2017-11-10 NOTE — Progress Notes (Signed)
ANTICOAGULATION CONSULT NOTE - Follow Up Consult  Pharmacy Consult for heparin Indication: CAD awaiting probable CABG  Labs: Recent Labs    11/08/17 1752  11/09/17 0227 11/09/17 0851 11/09/17 1404 11/09/17 2107 11/10/17 0550  HGB 16.3  --  15.2 14.8  --   --   --   HCT 48.0  --  44.9 44.4  --   --   --   PLT  --   --  213 195  --   --   --   HEPARINUNFRC  --   --   --   --  0.31 0.21* 0.26*  CREATININE 0.90  --  0.70  0.72  --   --   --   --   TROPONINI  --    < > 40.96* 53.02* 48.19*  --   --    < > = values in this interval not displayed.    Assessment: 72yo male remains subtherapeutic on heparin after rate change; RN reports no gtt issues or signs of bleeding.  Goal of Therapy:  Heparin level 0.3-0.5 units/ml   Plan:  Will increase heparin gtt by ~1 units/kg/hr to 1250 units/hr and check level in 6 hours.    Wynona Neat, PharmD, BCPS  11/10/2017,6:51 AM

## 2017-11-10 NOTE — Progress Notes (Signed)
Went to patients room to attempt PFT.  Patient nauseous at this time.  Will try again later today to see if he is feeling better; otherwise PFT will be done tomorrow.

## 2017-11-11 ENCOUNTER — Encounter (HOSPITAL_COMMUNITY): Payer: Self-pay | Admitting: Certified Registered Nurse Anesthetist

## 2017-11-11 ENCOUNTER — Other Ambulatory Visit: Payer: Self-pay

## 2017-11-11 ENCOUNTER — Inpatient Hospital Stay (HOSPITAL_COMMUNITY): Payer: PPO

## 2017-11-11 DIAGNOSIS — I2102 ST elevation (STEMI) myocardial infarction involving left anterior descending coronary artery: Secondary | ICD-10-CM

## 2017-11-11 LAB — GLUCOSE, CAPILLARY
GLUCOSE-CAPILLARY: 112 mg/dL — AB (ref 65–99)
GLUCOSE-CAPILLARY: 115 mg/dL — AB (ref 65–99)
GLUCOSE-CAPILLARY: 122 mg/dL — AB (ref 65–99)
Glucose-Capillary: 96 mg/dL (ref 65–99)
Glucose-Capillary: 139 mg/dL — ABNORMAL HIGH (ref 65–99)

## 2017-11-11 LAB — CBC
HCT: 41.3 % (ref 39.0–52.0)
Hemoglobin: 13.7 g/dL (ref 13.0–17.0)
MCH: 29.1 pg (ref 26.0–34.0)
MCHC: 33.2 g/dL (ref 30.0–36.0)
MCV: 87.7 fL (ref 78.0–100.0)
Platelets: 149 K/uL — ABNORMAL LOW (ref 150–400)
RBC: 4.71 MIL/uL (ref 4.22–5.81)
RDW: 13.6 % (ref 11.5–15.5)
WBC: 9.1 K/uL (ref 4.0–10.5)

## 2017-11-11 LAB — BASIC METABOLIC PANEL
Anion gap: 10 (ref 5–15)
BUN: 15 mg/dL (ref 6–20)
CHLORIDE: 106 mmol/L (ref 101–111)
CO2: 23 mmol/L (ref 22–32)
Calcium: 8.2 mg/dL — ABNORMAL LOW (ref 8.9–10.3)
Creatinine, Ser: 0.71 mg/dL (ref 0.61–1.24)
GFR calc Af Amer: >60 mL/min (ref >60)
GFR calc non Af Amer: >60 mL/min (ref >60)
Glucose, Bld: 107 mg/dL — ABNORMAL HIGH (ref 65–99)
POTASSIUM: 3.7 mmol/L (ref 3.5–5.1)
SODIUM: 139 mmol/L (ref 135–145)

## 2017-11-11 LAB — URINALYSIS, ROUTINE W REFLEX MICROSCOPIC

## 2017-11-11 LAB — HEMOGLOBIN A1C
Hgb A1c MFr Bld: 5.6 % (ref 4.8–5.6)
Mean Plasma Glucose: 114.02 mg/dL

## 2017-11-11 LAB — PULMONARY FUNCTION TEST
FEF 25-75 Pre: 3.27 L/sec
FEF2575-%Pred-Pre: 129 %
FEV1-%Pred-Pre: 86 %
FEV1-Pre: 2.92 L
FEV1FVC-%Pred-Pre: 113 %
FEV6-%Pred-Pre: 80 %
FEV6-Pre: 3.51 L
FEV6FVC-%Pred-Pre: 105 %
FVC-%Pred-Pre: 76 %
FVC-Pre: 3.53 L
Pre FEV1/FVC ratio: 83 %
Pre FEV6/FVC Ratio: 99 %

## 2017-11-11 LAB — CULTURE, RESPIRATORY

## 2017-11-11 LAB — HEPARIN LEVEL (UNFRACTIONATED): Heparin Unfractionated: 0.49 IU/mL (ref 0.30–0.70)

## 2017-11-11 LAB — CULTURE, RESPIRATORY W GRAM STAIN: Culture: NORMAL

## 2017-11-11 LAB — PROCALCITONIN: Procalcitonin: <0.1 ng/mL

## 2017-11-11 LAB — PHOSPHORUS: Phosphorus: 2.3 mg/dL — ABNORMAL LOW (ref 2.5–4.6)

## 2017-11-11 LAB — MAGNESIUM: MAGNESIUM: 1.9 mg/dL (ref 1.7–2.4)

## 2017-11-11 MED ORDER — NITROGLYCERIN 0.4 MG SL SUBL: 0.4000 mg | SUBLINGUAL_TABLET | SUBLINGUAL | Status: DC | PRN

## 2017-11-11 MED ORDER — MAGNESIUM SULFATE 2 GM/50ML IV SOLN
2.0000 g | Freq: Once | INTRAVENOUS | Status: AC
Start: 2017-11-11 — End: 2017-11-11
  Administered 2017-11-11: 2 g via INTRAVENOUS
  Filled 2017-11-11: qty 50

## 2017-11-11 MED ORDER — K PHOS MONO-SOD PHOS DI & MONO 155-852-130 MG PO TABS
500.0000 mg | ORAL_TABLET | Freq: Once | ORAL | Status: AC
  Administered 2017-11-11: 500 mg via ORAL
  Filled 2017-11-11: qty 2

## 2017-11-11 MED ORDER — CARVEDILOL 3.125 MG PO TABS
3.1250 mg | ORAL_TABLET | Freq: Two times a day (BID) | ORAL | Status: DC
  Administered 2017-11-11 – 2017-11-12 (×4): 3.125 mg via ORAL
  Filled 2017-11-11 (×4): qty 1

## 2017-11-11 NOTE — Progress Notes (Signed)
  Paged for Left shoulder pain.   Started several minutes ago, noted to have PVCs on monitor around the same time.   Pt states he has bilateral bone spurs in his shoulders, and this pain is similar, but more intense.  + pain with A/PROM and palpation.    This is NOT similar to his CP PTA. He currently denies any CP or SOB.   Check EKG for completeness.  NTG prn added to meds.   OK to use tylenol for pain. Consider tramadol if not improve.   Ambulate as tolerated.     Legrand Como [id number] [place], PA-C 11/11/2017 9:58 AM

## 2017-11-11 NOTE — Progress Notes (Signed)
ANTICOAGULATION CONSULT NOTE  Pharmacy Consult for Heparin Indication: chest pain/ACS  No Known Allergies  Patient Measurements: Height: 6' (182.9 cm) Weight: 172 lb 13.5 oz (78.4 kg) IBW/kg (Calculated) : 77.6  Vital Signs: Temp: 98.6 F (37 C) (03/13 0347) Temp Source: Oral (03/13 0347) BP: 136/69 (03/13 0700) Pulse Rate: 67 (03/13 0700)  Labs: Recent Labs    11/09/17 0851  11/10/17 0550 11/10/17 0806 11/10/17 1242 11/10/17 2029 11/10/17 2033 11/11/17 0230  HGB 14.8  --  14.2  --   --   --   --  13.7  HCT 44.4  --  41.3  --   --   --   --  41.3  PLT 195  --  153  --   --   --   --  149*  HEPARINUNFRC  --    < > 0.26*  --  0.33  --   --  0.49  CREATININE  --   --  0.71  --   --   --  0.66 0.71  TROPONINI 53.02*   < >  --  26.25* 23.89* 20.01*  --   --    < > = values in this interval not displayed.    Estimated Creatinine Clearance: 93 mL/min (by C-G formula based on SCr of 0.71 mg/dL).   Assessment: 72 year old post CPR with STEMI, now s/p cath awaiting possible CABG.    Heparin level is therapeutic on 1250 units/hr at 0.49. CBC remains stable, plts trending down slightly to 149. No signs/symptoms of bleeding. No infusion issues per nursing.    Tirofiban turned off this AM by cards on 3/12 - will increase goal range to 0.3-0.7 since off.  Goal of Therapy:  Heparin level = 0.3 to 0.7 Monitor platelets by anticoagulation protocol: Yes   Plan:  Continue IV heparin at 1250 units/hr. Daily heparin level, CBC Monitor for signs/symptoms of bleeding  Doylene Canard, PharmD Clinical Pharmacist  Pager: [phone] Clinical Phone for 11/11/2017 until 3:30pm: [phone] If after 3:30pm, please call main pharmacy at [phone]  11/11/2017 7:12 AM

## 2017-11-11 NOTE — Progress Notes (Addendum)
Advanced Heart Failure Rounding Note  PCP-Cardiologist: Shelva Majestic, MD   Subjective:    Feeling ok this am. Denies SOB. No peripheral edema.  Mild lightheadedness when transitioning from bed to chair.   Tentatively plan for CABG later this week.   Creatinine 0.71 today. K 3.7, Mg 1.9. Supp ordered.  Objective:   Weight Range: 172 lb 13.5 oz (78.4 kg) Body mass index is 23.44 kg/m.   Vital Signs:   Temp:  [98.1 F (36.7 C)-98.6 F (37 C)] 98.6 F (37 C) (03/13 0800) Pulse Rate:  [56-81] 79 (03/13 0800) Resp:  [9-25] 18 (03/13 0800) BP: (113-148)/(52-84) 145/76 (03/13 0800) SpO2:  [92 %-98 %] 94 % (03/13 0800) Weight:  [172 lb 13.5 oz (78.4 kg)] 172 lb 13.5 oz (78.4 kg) (03/13 0600) Last BM Date: 11/07/17  Weight change: Filed Weights   11/09/17 0426 11/10/17 0500 11/11/17 0600  Weight: 177 lb 0.5 oz (80.3 kg) 177 lb 4 oz (80.4 kg) 172 lb 13.5 oz (78.4 kg)   Intake/Output:   Intake/Output Summary (Last 24 hours) at 11/11/2017 0801 Last data filed at 11/11/2017 0800 Gross per 24 hour  Intake 654.02 ml  Output 715 ml  Net -60.98 ml    Physical Exam    General:  Fatigued. No resp difficulty HEENT: Normal Neck: Supple. JVP 6-7. Carotids 2+ bilat; no bruits. No lymphadenopathy or thyromegaly appreciated. Cor: PMI nondisplaced. Regular rate & rhythm. No rubs, gallops or murmurs. Lungs: Clear Abdomen: Soft, nontender, nondistended. No hepatosplenomegaly. No bruits or masses. Good bowel sounds. Extremities: No cyanosis, clubbing, rash, or edema Neuro: Alert & orientedx3, cranial nerves grossly intact. moves all 4 extremities w/o difficulty. Affect flat but  Appropriate.   Telemetry   NSR 70-80s, with occasional PVCs, personally reviewed. No further VT. HRs range from 50s-80s.  EKG    No new tracings.   Labs    CBC Recent Labs    11/10/17 0550 11/11/17 0230  WBC 8.6 9.1  HGB 14.2 13.7  HCT 41.3 41.3  MCV 88.8 87.7  PLT 153 536*   Basic Metabolic  Panel Recent Labs    11/10/17 0550 11/10/17 2033 11/11/17 0230  NA 141 137 139  K 4.0 3.8 3.7  CL 109 105 106  CO2 22 24 23   GLUCOSE 119* 136* 107*  BUN 18 16 15   CREATININE 0.71 0.66 0.71  CALCIUM 8.3* 8.2* 8.2*  MG 1.8  --  1.9  PHOS 2.1*  --  2.3*   Liver Function Tests Recent Labs    11/10/17 0550  AST 154*  ALT 84*  ALKPHOS 53  BILITOT 1.4*  PROT 4.9*  ALBUMIN 3.1*   No results for input(s): LIPASE, AMYLASE in the last 72 hours. Cardiac Enzymes Recent Labs    11/10/17 0806 11/10/17 1242 11/10/17 2029  TROPONINI 26.25* 23.89* 20.01*    BNP: BNP (last 3 results) No results for input(s): BNP in the last 8760 hours.  ProBNP (last 3 results) No results for input(s): PROBNP in the last 8760 hours.   D-Dimer No results for input(s): DDIMER in the last 72 hours. Hemoglobin A1C Recent Labs    11/11/17 0230  HGBA1C 5.6   Fasting Lipid Panel Recent Labs    11/10/17 0550  CHOL 146  HDL 40*  LDLCALC 91  TRIG 75  CHOLHDL 3.7   Thyroid Function Tests No results for input(s): TSH, T4TOTAL, T3FREE, THYROIDAB in the last 72 hours.  Invalid input(s): FREET3  Other results:  Imaging    [place] Ekg Site Rite  Result Date: 11/10/2017 If Site Rite image not attached, placement could not be confirmed due to current cardiac rhythm.     Medications:     Scheduled Medications: . aspirin  81 mg Oral Daily  . atorvastatin  80 mg Oral q1800  . Influenza vac split quadrivalent PF  0.5 mL Intramuscular Tomorrow-1000  . insulin aspart  0-9 Units Subcutaneous Q4H  . pantoprazole (PROTONIX) IV  40 mg Intravenous QHS  . pneumococcal 23 valent vaccine  0.5 mL Intramuscular Tomorrow-1000  . sodium chloride flush  3 mL Intravenous Q12H  . tamsulosin  0.4 mg Oral QHS     Infusions: . sodium chloride Stopped (11/11/17 0801)  . sodium chloride 250 mL (11/10/17 0357)  . heparin 1,250 Units/hr (11/11/17 0800)     PRN Medications:  sodium chloride, sodium  chloride, acetaminophen, bisacodyl, docusate sodium, ondansetron (ZOFRAN) IV, sodium chloride flush    Patient Profile   Edwin Morris is a 72 y.o. male S/P vfib cardiac arrest with STEMI. Cath showed multivessel ASCAD with 60% ostial LAD, complex trifurcation lesion at prox LAD/SP/D1 of 85-90 and 95% and diffuse 85% in mid LAD, 70% D1, 60-80% ostial LCx, 40-50% OM in co-dominant LCx and 90% distal RCA and 60-70% mid RCA with EF 35%.   CVTS consulted and plan for CABG Friday, 11/13/17.  Assessment/Plan   1.Cardiac arrest [facility]) in setting of STEMI, as below.  -S/p Vfib arrest - Maintaining NSR w/ occasional NSVT on amio.  - Continue amio gtt at 30/mg hr - No further.   2. CAD s/p STEMI   - LHC 11/08/17 with 60% ostial LAD, complex trifurcation lesion at prox LAD/SP/D1 of 85-90 and 95% and diffuse 85% in mid LAD, 70% D1, 60-80% ostial LCx, 40-50% OM in co-dominant LCx and 90% distal RCA and 60-70% mid RCA with EF 35%. - CVTS following. Plan for CABG Friday, 11/13/17.  - On heparin gtt.   - Continue ASA and statin. Stable.   3. Acute systolic CHF, ischemic CMP - Echo 11/10/17 40-45%, Grade 1 DD, Trivial AS, Trivial MR, Mod LAE, Trivial TR, PA peak pressure 25 mm Hg.  - Volume status stable on exam. No indications for diuresis.  - Low utility to add ACE/ARB prior to CABG, so will follow BP closely.  - Consider spiro.  - Consider low dose BB, but hesitant with HRs in 50-60s at times.  - We will continue to follow up to and post CABG.   4.Acute respiratory failure ([place]) - Extubated 11/09/17. Stable.   5.Hyperlipidemia LDL goal <70 - LDL 91  -On high dose statin - Hgb A1C pending.  6. Hypokalemia - K 3.7 today.  - Mg 1.9. Will supp today.  - Keep K > 4.0 and Mg > 2.0 with VF/VT.   7. H/o Non-Hodgkins lymphoma 2009 - treated with chemo. No XRT - No change.  Medication concerns reviewed with patient and pharmacy team. Barriers identified: None at this time.    Length of Stay: 3  Annamaria Helling  11/11/2017, 8:01 AM  Advanced Heart Failure Team Pager [phone] (M-F; 7a - 4p)  Please contact [facility] for night-coverage after hours (4p -7a ) and weekends on [email or web address]   Patient seen and examined with the above-signed Advanced Practice Provider and/or Housestaff. I personally reviewed laboratory data, imaging studies and relevant notes. I independently examined the patient and formulated the important aspects of the plan. I have edited  the note to reflect any of my changes or salient points. I have personally discussed the plan with the patient and/or family.  Had left shoulder pain this am. ECG non-acute. Appears musculoskeletal. EF 40% on echo with anterior apical HK. Cath films reviewed with team. HR elevated. Rhythm stable. Will start low-dose b-blocker. Continue heparin, ASA, statin. Discussed with PharmD personally. CABG Friday.  Glori Bickers, MD  9:18 PM

## 2017-11-11 NOTE — Progress Notes (Signed)
Visited with this patient who states he is having surgery this Friday if everything continues to go well.  He has several family members in with him which he says brings him joy.  Patient states he would like prayer prior to surgery.  Chaplain will pray with him prior to surgery.    11/11/17 1117  Clinical Encounter Type  Visited With Patient;Patient and family together  Visit Type Initial;Spiritual support;Pre-op

## 2017-11-11 NOTE — Progress Notes (Signed)
CARDIAC REHAB PHASE I   PRE:  Rate/Rhythm: 77 SR    BP: sitting 135/78    SaO2: 96 2L, 96 RA  MODE:  Ambulation: 290 ft   POST:  Rate/Rhythm: 87 SR    BP: sitting 124/70     SaO2: 96 RA  Pt eager to walk. Tolerated well, used RW for initial walk but probably doesn't need preop.  Able to wean off O2. Denied CP. Return to recliner. Discussed sternal precautions, Move in the Tube, IS (1750 mL), mobility, and d/c planning. Voiced understanding. His wife will be with him at d/c. Gave materials and video to watch. Pt receptive, wants to walk more. Will f/u as time permits tomorrow. [place], ACSM 11/11/2017 3:17 PM

## 2017-11-12 ENCOUNTER — Inpatient Hospital Stay (HOSPITAL_COMMUNITY): Payer: PPO

## 2017-11-12 DIAGNOSIS — Z01810 Encounter for preprocedural cardiovascular examination: Secondary | ICD-10-CM

## 2017-11-12 LAB — COMPREHENSIVE METABOLIC PANEL
ALBUMIN: 2.8 g/dL — AB (ref 3.5–5.0)
ALK PHOS: 54 U/L (ref 38–126)
ALT: 55 U/L (ref 17–63)
ALT: 56 U/L (ref 17–63)
AST: 65 U/L — ABNORMAL HIGH (ref 15–41)
AST: 66 U/L — AB (ref 15–41)
Albumin: 2.8 g/dL — ABNORMAL LOW (ref 3.5–5.0)
Alkaline Phosphatase: 54 U/L (ref 38–126)
Anion gap: 6 (ref 5–15)
Anion gap: 8 (ref 5–15)
BUN: 13 mg/dL (ref 6–20)
BUN: 13 mg/dL (ref 6–20)
CALCIUM: 8.1 mg/dL — AB (ref 8.9–10.3)
CO2: 25 mmol/L (ref 22–32)
CO2: 26 mmol/L (ref 22–32)
CREATININE: 0.63 mg/dL (ref 0.61–1.24)
Calcium: 8 mg/dL — ABNORMAL LOW (ref 8.9–10.3)
Chloride: 106 mmol/L (ref 101–111)
Chloride: 107 mmol/L (ref 101–111)
Creatinine, Ser: 0.62 mg/dL (ref 0.61–1.24)
GFR calc Af Amer: >60 mL/min (ref >60)
GFR calc Af Amer: >60 mL/min (ref >60)
GFR calc non Af Amer: >60 mL/min (ref >60)
GFR calc non Af Amer: >60 mL/min (ref >60)
GLUCOSE: 111 mg/dL — AB (ref 65–99)
Glucose, Bld: 110 mg/dL — ABNORMAL HIGH (ref 65–99)
Potassium: 3.3 mmol/L — ABNORMAL LOW (ref 3.5–5.1)
Potassium: 3.4 mmol/L — ABNORMAL LOW (ref 3.5–5.1)
Sodium: 139 mmol/L (ref 135–145)
Sodium: 139 mmol/L (ref 135–145)
Total Bilirubin: 1.3 mg/dL — ABNORMAL HIGH (ref 0.3–1.2)
Total Bilirubin: 1.3 mg/dL — ABNORMAL HIGH (ref 0.3–1.2)
Total Protein: 4.7 g/dL — ABNORMAL LOW (ref 6.5–8.1)
Total Protein: 4.7 g/dL — ABNORMAL LOW (ref 6.5–8.1)

## 2017-11-12 LAB — CBC
HEMATOCRIT: 37 % — AB (ref 39.0–52.0)
HEMATOCRIT: 36.5 % — AB (ref 39.0–52.0)
HEMOGLOBIN: 12.5 g/dL — AB (ref 13.0–17.0)
HEMOGLOBIN: 12.3 g/dL — AB (ref 13.0–17.0)
MCH: 29.1 pg (ref 26.0–34.0)
MCH: 29.4 pg (ref 26.0–34.0)
MCHC: 33.7 g/dL (ref 30.0–36.0)
MCHC: 33.8 g/dL (ref 30.0–36.0)
MCV: 87.1 fL (ref 78.0–100.0)
MCV: 86.5 fL (ref 78.0–100.0)
Platelets: 148 10*3/uL — ABNORMAL LOW (ref 150–400)
Platelets: 150 10*3/uL (ref 150–400)
RBC: 4.22 MIL/uL (ref 4.22–5.81)
RBC: 4.25 MIL/uL (ref 4.22–5.81)
RDW: 13.3 % (ref 11.5–15.5)
RDW: 13.4 % (ref 11.5–15.5)
WBC: 6.8 10*3/uL (ref 4.0–10.5)
WBC: 7.1 10*3/uL (ref 4.0–10.5)

## 2017-11-12 LAB — GLUCOSE, CAPILLARY
GLUCOSE-CAPILLARY: 104 mg/dL — AB (ref 65–99)
GLUCOSE-CAPILLARY: 124 mg/dL — AB (ref 65–99)
GLUCOSE-CAPILLARY: 144 mg/dL — AB (ref 65–99)
Glucose-Capillary: 115 mg/dL — ABNORMAL HIGH (ref 65–99)
Glucose-Capillary: 118 mg/dL — ABNORMAL HIGH (ref 65–99)
Glucose-Capillary: 122 mg/dL — ABNORMAL HIGH (ref 65–99)
Glucose-Capillary: 134 mg/dL — ABNORMAL HIGH (ref 65–99)

## 2017-11-12 LAB — PROTIME-INR
INR: 1.2
INR: 1.2
PROTHROMBIN TIME: 15.1 s (ref 11.4–15.2)
Prothrombin Time: 15.1 seconds (ref 11.4–15.2)

## 2017-11-12 LAB — TYPE AND SCREEN
ABO/RH(D): B POS
Antibody Screen: NEGATIVE

## 2017-11-12 LAB — PHOSPHORUS
PHOSPHORUS: 2.9 mg/dL (ref 2.5–4.6)
Phosphorus: 2.9 mg/dL (ref 2.5–4.6)

## 2017-11-12 LAB — MAGNESIUM
Magnesium: 1.7 mg/dL (ref 1.7–2.4)
Magnesium: 1.7 mg/dL (ref 1.7–2.4)

## 2017-11-12 LAB — HEPARIN LEVEL (UNFRACTIONATED): Heparin Unfractionated: 0.32 IU/mL (ref 0.30–0.70)

## 2017-11-12 LAB — ABO/RH: ABO/RH(D): B POS

## 2017-11-12 MED ORDER — POTASSIUM CHLORIDE CRYS ER 20 MEQ PO TBCR
40.0000 meq | EXTENDED_RELEASE_TABLET | Freq: Two times a day (BID) | ORAL | Status: AC
  Administered 2017-11-12 (×2): 40 meq via ORAL
  Filled 2017-11-12 (×2): qty 2

## 2017-11-12 MED ORDER — TRANEXAMIC ACID 1000 MG/10ML IV SOLN
1.5000 mg/kg/h | INTRAVENOUS | Status: AC
  Administered 2017-11-13: 1.5 mg/kg/h via INTRAVENOUS
  Filled 2017-11-12: qty 25

## 2017-11-12 MED ORDER — MAGNESIUM SULFATE 50 % IJ SOLN
40.0000 meq | INTRAMUSCULAR | Status: DC
  Filled 2017-11-12: qty 9.85

## 2017-11-12 MED ORDER — TEMAZEPAM 15 MG PO CAPS
15.0000 mg | ORAL_CAPSULE | Freq: Once | ORAL | Status: AC | PRN
  Administered 2017-11-12: 15 mg via ORAL
  Filled 2017-11-12: qty 1

## 2017-11-12 MED ORDER — POTASSIUM CHLORIDE 2 MEQ/ML IV SOLN
80.0000 meq | INTRAVENOUS | Status: DC
  Filled 2017-11-12: qty 40

## 2017-11-12 MED ORDER — MILRINONE LACTATE IN DEXTROSE 20-5 MG/100ML-% IV SOLN
0.1250 ug/kg/min | INTRAVENOUS | Status: DC
  Filled 2017-11-12: qty 100

## 2017-11-12 MED ORDER — SODIUM CHLORIDE 0.9 % IV SOLN
INTRAVENOUS | Status: AC
  Administered 2017-11-13: 1 [IU]/h via INTRAVENOUS
  Filled 2017-11-12: qty 1

## 2017-11-12 MED ORDER — TRANEXAMIC ACID (OHS) PUMP PRIME SOLUTION
2.0000 mg/kg | INTRAVENOUS | Status: DC
  Filled 2017-11-12: qty 1.59

## 2017-11-12 MED ORDER — SPIRONOLACTONE 12.5 MG HALF TABLET
12.5000 mg | ORAL_TABLET | Freq: Every day | ORAL | Status: DC
  Administered 2017-11-12: 12.5 mg via ORAL
  Filled 2017-11-12 (×2): qty 1

## 2017-11-12 MED ORDER — METOPROLOL TARTRATE 12.5 MG HALF TABLET
12.5000 mg | ORAL_TABLET | Freq: Once | ORAL | Status: AC
  Administered 2017-11-13: 12.5 mg via ORAL
  Filled 2017-11-12: qty 1

## 2017-11-12 MED ORDER — VANCOMYCIN HCL 10 G IV SOLR
1250.0000 mg | INTRAVENOUS | Status: AC
  Administered 2017-11-13: 1250 mg via INTRAVENOUS
  Filled 2017-11-12: qty 1250

## 2017-11-12 MED ORDER — MAGNESIUM SULFATE 4 GM/100ML IV SOLN
4.0000 g | Freq: Once | INTRAVENOUS | Status: AC
  Administered 2017-11-12: 4 g via INTRAVENOUS
  Filled 2017-11-12: qty 100

## 2017-11-12 MED ORDER — CHLORHEXIDINE GLUCONATE CLOTH 2 % EX PADS
6.0000 | MEDICATED_PAD | Freq: Once | CUTANEOUS | Status: AC
  Administered 2017-11-13: 6 via TOPICAL

## 2017-11-12 MED ORDER — NITROGLYCERIN IN D5W 200-5 MCG/ML-% IV SOLN
2.0000 ug/min | INTRAVENOUS | Status: AC
  Administered 2017-11-13: 5 ug/min via INTRAVENOUS
  Filled 2017-11-12: qty 250

## 2017-11-12 MED ORDER — SODIUM CHLORIDE 0.9 % IV SOLN
750.0000 mg | INTRAVENOUS | Status: DC
  Filled 2017-11-12: qty 750

## 2017-11-12 MED ORDER — SODIUM CHLORIDE 0.9 % IV SOLN
1.5000 g | INTRAVENOUS | Status: AC
  Administered 2017-11-13: 1.5 g via INTRAVENOUS
  Filled 2017-11-12: qty 1.5

## 2017-11-12 MED ORDER — TRANEXAMIC ACID (OHS) BOLUS VIA INFUSION
15.0000 mg/kg | INTRAVENOUS | Status: AC
  Administered 2017-11-13: 1192.5 mg via INTRAVENOUS
  Filled 2017-11-12: qty 1193

## 2017-11-12 MED ORDER — PLASMA-LYTE 148 IV SOLN
INTRAVENOUS | Status: AC
  Administered 2017-11-13: 500 mL
  Filled 2017-11-12: qty 2.5

## 2017-11-12 MED ORDER — SODIUM CHLORIDE 0.9 % IV SOLN
INTRAVENOUS | Status: DC
  Filled 2017-11-12: qty 30

## 2017-11-12 MED ORDER — CHLORHEXIDINE GLUCONATE CLOTH 2 % EX PADS
6.0000 | MEDICATED_PAD | Freq: Once | CUTANEOUS | Status: AC
  Administered 2017-11-12: 6 via TOPICAL

## 2017-11-12 MED ORDER — EPINEPHRINE PF 1 MG/ML IJ SOLN
0.0000 ug/min | INTRAVENOUS | Status: DC
  Filled 2017-11-12: qty 4

## 2017-11-12 MED ORDER — SODIUM CHLORIDE 0.9 % IV SOLN
30.0000 ug/min | INTRAVENOUS | Status: AC
  Administered 2017-11-13: 20 ug/min via INTRAVENOUS
  Filled 2017-11-12: qty 2

## 2017-11-12 MED ORDER — CHLORHEXIDINE GLUCONATE 0.12 % MT SOLN
15.0000 mL | Freq: Once | OROMUCOSAL | Status: AC
  Administered 2017-11-13: 15 mL via OROMUCOSAL
  Filled 2017-11-12: qty 15

## 2017-11-12 MED ORDER — DEXMEDETOMIDINE HCL IN NACL 400 MCG/100ML IV SOLN
0.1000 ug/kg/h | INTRAVENOUS | Status: AC
  Administered 2017-11-13: .5 ug/kg/h via INTRAVENOUS
  Filled 2017-11-12: qty 100

## 2017-11-12 MED ORDER — DOPAMINE-DEXTROSE 3.2-5 MG/ML-% IV SOLN
0.0000 ug/kg/min | INTRAVENOUS | Status: AC
  Administered 2017-11-13: 3 ug/kg/min via INTRAVENOUS
  Filled 2017-11-12: qty 250

## 2017-11-12 MED ORDER — BISACODYL 5 MG PO TBEC
5.0000 mg | DELAYED_RELEASE_TABLET | Freq: Once | ORAL | Status: DC
  Filled 2017-11-12: qty 1

## 2017-11-12 NOTE — Progress Notes (Addendum)
Physical Therapy Treatment Patient Details Name: Edwin Morris MRN: [id number] DOB: 12/27/1945 Today's Date: 11/12/2017    History of Present Illness Pt is a 72 y.o. male admitted 11/08/17 with c/o chest pain in setting of STEMI; during EMS transport, developed VF arrest and resceived CPR and defibrillation with restoration of sinus rhythm. Intubated in ER; extubated 3/11. S/p emergent cath revealing severe multivessel disease. Plan for CABG on 3/15. PMH includes CAD, CHF, BPH.   PT Comments    Pt independent with all mobility while maintaining sternal precautions. Able to stand and transfer without use of UEs. Amb 750' independently. DGI 22/24 indicates decreased risk for falls with higher level balance activities. SpO2 >90% on RA. Expect pt to progress well post-CABG scheduled for tomorrow and not require follow-up PT services. Will have 24/7 support from wife at home.   Spoke with Dr. Haroldine Laws who states that although pt currently independent, he would plan to reorder a PT evaluation post-up, so will keep pt on caseload to check in with Monday and progress plan of care accordingly.   Follow Up Recommendations  No PT follow up     Equipment Recommendations  (TBD post-op)    Recommendations for Other Services       Precautions / Restrictions Precautions Precautions: None Precaution Comments: Practiced maintaining sternal precautions in preparation for CABG  Restrictions Weight Bearing Restrictions: No    Mobility  Bed Mobility Overal bed mobility: Independent             General bed mobility comments: Indep while maintaining sternal precautions  Transfers Overall transfer level: Independent Equipment used: None             General transfer comment: Indep to stand without use of UEs   Ambulation/Gait Ambulation/Gait assistance: Independent Ambulation Distance (Feet): 750 Feet Assistive device: None Gait Pattern/deviations: Step-through pattern;Decreased  stride length   Gait velocity interpretation: at or above normal speed for age/gender     Stairs Stairs: Yes   Stair Management: No rails;Forwards Number of Stairs: 5 General stair comments: Simulated ascending steps by performing high knee marching with no UE support  Wheelchair Mobility    Modified Rankin (Stroke Patients Only)       Balance     Sitting balance-Leahy Scale: Good       Standing balance-Leahy Scale: Good                   Standardized Balance Assessment Standardized Balance Assessment : Dynamic Gait Index   Dynamic Gait Index Level Surface: Normal Change in Gait Speed: Normal Gait with Horizontal Head Turns: Normal Gait with Vertical Head Turns: Mild Impairment Gait and Pivot Turn: Normal Step Over Obstacle: Mild Impairment Step Around Obstacles: Normal Steps: Normal Total Score: 22      Cognition Arousal/Alertness: Awake/alert Behavior During Therapy: WFL for tasks assessed/performed Overall Cognitive Status: Within Functional Limits for tasks assessed                                        Exercises      General Comments        Pertinent Vitals/Pain Pain Assessment: No/denies pain    Home Living                      Prior Function            PT Goals (current  goals can now be found in the care plan section) Acute Rehab PT Goals Patient Stated Goal: Return home PT Goal Formulation: With patient Time For Goal Achievement: 11/24/17 Potential to Achieve Goals: Good Progress towards PT goals: Progressing toward goals    Frequency    Min 3X/week      PT Plan Current plan remains appropriate    Co-evaluation              AM-PAC PT "6 Clicks" Daily Activity  Outcome Measure  Difficulty turning over in bed (including adjusting bedclothes, sheets and blankets)?: None Difficulty moving from lying on back to sitting on the side of the bed? : None Difficulty sitting down on and  standing up from a chair with arms (e.g., wheelchair, bedside commode, etc,.)?: None Help needed moving to and from a bed to chair (including a wheelchair)?: None Help needed walking in hospital room?: None Help needed climbing 3-5 steps with a railing? : None 6 Click Score: 24    End of Session Equipment Utilized During Treatment: Gait belt Activity Tolerance: Patient tolerated treatment well Patient left: in chair;with call bell/phone within reach;with family/visitor present Nurse Communication: Mobility status PT Visit Diagnosis: Other abnormalities of gait and mobility (R26.89)     Time: 5093-2671 PT Time Calculation (min) (ACUTE ONLY): 21 min  Charges:  $Gait Training: 8-22 mins                    G Codes:      Mabeline Caras, PT, DPT Acute Rehab Services  Pager: [place] 11/12/2017, 10:31 AM

## 2017-11-12 NOTE — Progress Notes (Signed)
Patient ID: Edwin Morris, male   DOB: Feb 24, 1946, 72 y.o.   MRN: [id number] TCTS DAILY ICU PROGRESS NOTE                   [address]            [place] [address]          [phone]   4 Days Post-Op Procedure(s) (LRB): LEFT HEART CATH AND CORONARY ANGIOGRAPHY (N/A)  Total Length of Stay:  LOS: 4 days   Subjective: Improvement, good respiratory effort   Objective: Vital signs in last 24 hours: Temp:  [98.3 F (36.8 C)-98.7 F (37.1 C)] 98.3 F (36.8 C) (03/14 1600) Pulse Rate:  [62-87] 79 (03/14 1700) Cardiac Rhythm: Normal sinus rhythm (03/14 1700) Resp:  [9-26] 21 (03/14 1700) BP: (99-156)/(51-90) 140/85 (03/14 1700) SpO2:  [90 %-98 %] 96 % (03/14 1700) Weight:  [175 lb 4.3 oz (79.5 kg)] 175 lb 4.3 oz (79.5 kg) (03/14 0500)  Filed Weights   11/10/17 0500 11/11/17 0600 11/12/17 0500  Weight: 177 lb 4 oz (80.4 kg) 172 lb 13.5 oz (78.4 kg) 175 lb 4.3 oz (79.5 kg)    Weight change: 2 lb 6.8 oz (1.1 kg)   Hemodynamic parameters for last 24 hours:    Intake/Output from previous day: 03/13 0701 - 03/14 0700 In: 382.5 [I.V.:332.5; IV Piggyback:50] Out: 200 [Urine:200]  Intake/Output this shift: Total I/O In: 229.4 [I.V.:129.4; IV Piggyback:100] Out: -   Current Meds: Scheduled Meds: . aspirin  81 mg Oral Daily  . atorvastatin  80 mg Oral q1800  . carvedilol  3.125 mg Oral BID WC  . [START ON 11/13/2017] heparin-papaverine-plasmalyte irrigation   Irrigation To OR  . Influenza vac split quadrivalent PF  0.5 mL Intramuscular Tomorrow-1000  . insulin aspart  0-9 Units Subcutaneous Q4H  . [START ON 11/13/2017] magnesium sulfate  40 mEq Other To OR  . pantoprazole (PROTONIX) IV  40 mg Intravenous QHS  . pneumococcal 23 valent vaccine  0.5 mL Intramuscular Tomorrow-1000  . [START ON 11/13/2017] potassium chloride  80 mEq Other To OR  . potassium chloride  40 mEq Oral BID  . sodium chloride flush  3 mL Intravenous Q12H  . spironolactone  12.5 mg  Oral Daily  . tamsulosin  0.4 mg Oral QHS  . [START ON 11/13/2017] tranexamic acid  15 mg/kg Intravenous To OR  . [START ON 11/13/2017] tranexamic acid  2 mg/kg Intracatheter To OR   Continuous Infusions: . sodium chloride Stopped (11/11/17 0801)  . sodium chloride 250 mL (11/10/17 0357)  . [START ON 11/13/2017] cefUROXime (ZINACEF)  IV    . [START ON 11/13/2017] cefUROXime (ZINACEF)  IV    . [START ON 11/13/2017] dexmedetomidine    . [START ON 11/13/2017] DOPamine    . [START ON 11/13/2017] epinephrine    . [START ON 11/13/2017] heparin 30,000 units/NS 1000 mL solution for CELLSAVER    . heparin 1,300 Units/hr (11/12/17 1700)  . [START ON 11/13/2017] insulin (NOVOLIN-R) infusion    . [START ON 11/13/2017] milrinone    . [START ON 11/13/2017] nitroGLYCERIN    . [START ON 11/13/2017] phenylephrine 20mg /224mL NS (0.08mg /ml) infusion    . [START ON 11/13/2017] tranexamic acid (CYKLOKAPRON) infusion ([facility])    . [START ON 11/13/2017] vancomycin     PRN Meds:.sodium chloride, sodium chloride, acetaminophen, bisacodyl, docusate sodium, nitroGLYCERIN, ondansetron (ZOFRAN) IV, sodium chloride flush  General appearance: alert and cooperative Neurologic: intact Heart: regular rate and  rhythm, S1, S2 normal, no murmur, click, rub or gallop Lungs: diminished breath sounds base - both Abdomen: soft, non-tender; bowel sounds normal; no masses,  no organomegaly Extremities: extremities normal, atraumatic, no cyanosis or edema and Homans sign is negative, no sign of DVT  Lab Results: CBC: Recent Labs    11/11/17 0230 11/12/17 0239  WBC 9.1 6.8  7.1  HGB 13.7 12.5*  12.3*  HCT 41.3 37.0*  36.5*  PLT 149* 148*  150   BMET:  Recent Labs    11/11/17 0230 11/12/17 0239  NA 139 139  139  K 3.7 3.4*  3.3*  CL 106 107  106  CO2 23 26  25   GLUCOSE 107* 111*  110*  BUN 15 13  13   CREATININE 0.71 0.63  0.62  CALCIUM 8.2* 8.1*  8.0*    CMET: Lab Results  Component Value Date   WBC 7.1  11/12/2017   WBC 6.8 11/12/2017   HGB 12.3 (L) 11/12/2017   HGB 12.5 (L) 11/12/2017   HCT 36.5 (L) 11/12/2017   HCT 37.0 (L) 11/12/2017   PLT 150 11/12/2017   PLT 148 (L) 11/12/2017   GLUCOSE 110 (H) 11/12/2017   GLUCOSE 111 (H) 11/12/2017   CHOL 146 11/10/2017   TRIG 75 11/10/2017   HDL 40 (L) 11/10/2017   LDLCALC 91 11/10/2017   ALT 55 11/12/2017   ALT 56 11/12/2017   AST 65 (H) 11/12/2017   AST 66 (H) 11/12/2017   NA 139 11/12/2017   NA 139 11/12/2017   K 3.3 (L) 11/12/2017   K 3.4 (L) 11/12/2017   CL 106 11/12/2017   CL 107 11/12/2017   CREATININE 0.62 11/12/2017   CREATININE 0.63 11/12/2017   BUN 13 11/12/2017   BUN 13 11/12/2017   CO2 25 11/12/2017   CO2 26 11/12/2017   INR 1.20 11/12/2017   INR 1.20 11/12/2017   HGBA1C 5.6 11/11/2017      PT/INR:  Recent Labs    11/12/17 0239  LABPROT 15.1  15.1  INR 1.20  1.20   Radiology: Dg Chest Port 1 View  Result Date: 11/12/2017 CLINICAL DATA:  Follow-up chest tube EXAM: PORTABLE CHEST 1 VIEW COMPARISON:  11/09/2017 FINDINGS: The endotracheal tube and nasogastric catheter have been removed in the interval. The cardiac shadow is within normal limits. Multiple radiopaque densities are noted consistent with prior shrapnel injuries. No focal infiltrate or sizable effusion is seen. No acute bony abnormality is noted. IMPRESSION: No acute abnormality noted. Electronically Signed   By: Inez Catalina M.D.   On: 11/12/2017 09:23     Assessment/Plan: S/P Procedure(s) (LRB): LEFT HEART CATH AND CORONARY ANGIOGRAPHY (N/A) Mobilize  Plan to proceed with CABG tomorrow The goals risks and alternatives of the planned surgical procedure CABG have been discussed with the patient in detail. The risks of the procedure including death, infection, stroke, myocardial infarction, bleeding, blood transfusion have all been discussed specifically.  I have quoted Edwin Morris a 3 % of perioperative mortality and a complication rate as  high as 40 %. The patient's questions have been answered.Edwin Morris is willing  to proceed with the planned procedure.   Grace Isaac 11/12/2017 5:43 PM

## 2017-11-12 NOTE — Progress Notes (Signed)
CARDIAC REHAB PHASE I   PRE:  Rate/Rhythm: 86 SR    BP: sitting 130/79    SaO2: 95 RA  MODE:  Ambulation: 750 ft   POST:  Rate/Rhythm: 98 SR    BP: sitting 144/84     SaO2: 96 RA  Quick pace, independently. No sx. No questions regarding surgery. He has read book and watched video. Wife present and very knowledgeable. Now doing 2500 mL on IS. [id number]   Darrick Meigs CES, ACSM 11/12/2017 12:11 PM

## 2017-11-12 NOTE — Progress Notes (Addendum)
Advanced Heart Failure Rounding Note  PCP-Cardiologist: Shelva Majestic, MD   Subjective:    Feeling OK this am. Denies SOB, further CP or arm pain. No lightheadedness or dizziness walking halls. Doing IS.  No peripheral edema. Walked with PT and did well.   Scheduled for CABG tomorrow am.   Creatinine 0.62. K 3.3. Mg 1.7. Supp ordered.   Objective:   Weight Range: 175 lb 4.3 oz (79.5 kg) Body mass index is 23.77 kg/m.   Vital Signs:   Temp:  [98.1 F (36.7 C)-98.6 F (37 C)] 98.6 F (37 C) (03/14 0800) Pulse Rate:  [62-89] 87 (03/14 0900) Resp:  [9-23] 19 (03/14 0900) BP: (99-156)/(51-90) 156/79 (03/14 0900) SpO2:  [90 %-99 %] 95 % (03/14 0900) Weight:  [175 lb 4.3 oz (79.5 kg)] 175 lb 4.3 oz (79.5 kg) (03/14 0500) Last BM Date: 11/11/17  Weight change: Filed Weights   11/10/17 0500 11/11/17 0600 11/12/17 0500  Weight: 177 lb 4 oz (80.4 kg) 172 lb 13.5 oz (78.4 kg) 175 lb 4.3 oz (79.5 kg)   Intake/Output:   Intake/Output Summary (Last 24 hours) at 11/12/2017 0911 Last data filed at 11/12/2017 0900 Gross per 24 hour  Intake 400.43 ml  Output 200 ml  Net 200.43 ml    Physical Exam    General: Fatigued. NAD.  HEENT: Normal. Anicteric  Neck: Supple. JVP 6-7. Carotids 2+ bilat; no bruits. No thyromegaly or nodule noted. Cor: PMI nondisplaced. RRR, No M/G/R noted Lungs: CTAB, normal effort. No wheeze  Abdomen: Soft, non-tender, non-distended, no HSM. No bruits or masses. +BS  Extremities: No cyanosis, clubbing, or rash. R and LLE no edema.  Neuro: Alert & orientedx3, cranial nerves grossly intact. moves all 4 extremities w/o difficulty. Affect pleasant   Telemetry   NSR 70-80s, with occasional PVCs, personally reviewed.   EKG    No new tracings.    Labs    CBC Recent Labs    11/11/17 0230 11/12/17 0239  WBC 9.1 6.8  7.1  HGB 13.7 12.5*  12.3*  HCT 41.3 37.0*  36.5*  MCV 87.7 87.1  86.5  PLT 149* 148*  211   Basic Metabolic Panel Recent  Labs    11/11/17 0230 11/12/17 0239  NA 139 139  139  K 3.7 3.4*  3.3*  CL 106 107  106  CO2 23 26  25   GLUCOSE 107* 111*  110*  BUN 15 13  13   CREATININE 0.71 0.63  0.62  CALCIUM 8.2* 8.1*  8.0*  MG 1.9 1.7  1.7  PHOS 2.3* 2.9  2.9   Liver Function Tests Recent Labs    11/10/17 0550 11/12/17 0239  AST 154* 66*  65*  ALT 84* 56  55  ALKPHOS 53 54  54  BILITOT 1.4* 1.3*  1.3*  PROT 4.9* 4.7*  4.7*  ALBUMIN 3.1* 2.8*  2.8*   No results for input(s): LIPASE, AMYLASE in the last 72 hours. Cardiac Enzymes Recent Labs    11/10/17 0806 11/10/17 1242 11/10/17 2029  TROPONINI 26.25* 23.89* 20.01*    BNP: BNP (last 3 results) No results for input(s): BNP in the last 8760 hours.  ProBNP (last 3 results) No results for input(s): PROBNP in the last 8760 hours.   D-Dimer No results for input(s): DDIMER in the last 72 hours. Hemoglobin A1C Recent Labs    11/11/17 0230  HGBA1C 5.6   Fasting Lipid Panel Recent Labs    11/10/17 0550  CHOL 146  HDL 40*  LDLCALC 91  TRIG 75  CHOLHDL 3.7   Thyroid Function Tests No results for input(s): TSH, T4TOTAL, T3FREE, THYROIDAB in the last 72 hours.  Invalid input(s): FREET3  Other results:   Imaging    No results found.   Medications:     Scheduled Medications: . aspirin  81 mg Oral Daily  . atorvastatin  80 mg Oral q1800  . carvedilol  3.125 mg Oral BID WC  . [START ON 11/13/2017] heparin-papaverine-plasmalyte irrigation   Irrigation To OR  . Influenza vac split quadrivalent PF  0.5 mL Intramuscular Tomorrow-1000  . insulin aspart  0-9 Units Subcutaneous Q4H  . [START ON 11/13/2017] magnesium sulfate  40 mEq Other To OR  . pantoprazole (PROTONIX) IV  40 mg Intravenous QHS  . pneumococcal 23 valent vaccine  0.5 mL Intramuscular Tomorrow-1000  . [START ON 11/13/2017] potassium chloride  80 mEq Other To OR  . potassium chloride  40 mEq Oral BID  . sodium chloride flush  3 mL Intravenous Q12H    . tamsulosin  0.4 mg Oral QHS  . [START ON 11/13/2017] tranexamic acid  15 mg/kg Intravenous To OR  . [START ON 11/13/2017] tranexamic acid  2 mg/kg Intracatheter To OR    Infusions: . sodium chloride Stopped (11/11/17 0801)  . sodium chloride 250 mL (11/10/17 0357)  . [START ON 11/13/2017] cefUROXime (ZINACEF)  IV    . [START ON 11/13/2017] cefUROXime (ZINACEF)  IV    . [START ON 11/13/2017] dexmedetomidine    . [START ON 11/13/2017] DOPamine    . [START ON 11/13/2017] epinephrine    . [START ON 11/13/2017] heparin 30,000 units/NS 1000 mL solution for CELLSAVER    . heparin 1,300 Units/hr (11/12/17 0900)  . [START ON 11/13/2017] insulin (NOVOLIN-R) infusion    . magnesium sulfate 1 - 4 g bolus IVPB 4 g (11/12/17 0844)  . [START ON 11/13/2017] milrinone    . [START ON 11/13/2017] nitroGLYCERIN    . [START ON 11/13/2017] phenylephrine 20mg /240mL NS (0.08mg /ml) infusion    . [START ON 11/13/2017] tranexamic acid (CYKLOKAPRON) infusion ([facility])    . [START ON 11/13/2017] vancomycin      PRN Medications: sodium chloride, sodium chloride, acetaminophen, bisacodyl, docusate sodium, nitroGLYCERIN, ondansetron (ZOFRAN) IV, sodium chloride flush    Patient Profile   Edwin Morris is a 72 y.o. male S/P vfib cardiac arrest with STEMI. Cath showed multivessel ASCAD with 60% ostial LAD, complex trifurcation lesion at prox LAD/SP/D1 of 85-90 and 95% and diffuse 85% in mid LAD, 70% D1, 60-80% ostial LCx, 40-50% OM in co-dominant LCx and 90% distal RCA and 60-70% mid RCA with EF 35%.   CVTS consulted and plan for CABG Friday, 11/13/17.  Assessment/Plan   1.Cardiac arrest [facility]) in setting of STEMI, as below.  -S/p Vfib arrest - Maintaining NSR w/ occasional NSVT on amio.  - Continue amio gtt at 30/mg hr - No further.   2. CAD s/p STEMI   - LHC 11/08/17 with 60% ostial LAD, complex trifurcation lesion at prox LAD/SP/D1 of 85-90 and 95% and diffuse 85% in mid LAD, 70% D1, 60-80% ostial LCx, 40-50%  OM in co-dominant LCx and 90% distal RCA and 60-70% mid RCA with EF 35%. - CVTS following. Plan for CABG tomorrow.  - On heparin gtt.   - Continue ASA and statin. Stable.   3. Acute systolic CHF, ischemic CMP - Echo 11/10/17 40-45%, Grade 1 DD, Trivial AS, Trivial MR, Mod LAE, Trivial TR, PA  peak pressure 25 mm Hg.  - Volume status stable on exam.  - Low utility to add ACE/ARB prior to CABG, so will follow BP closely.  - Will start spiro 12.5 mg daily with low K and BP in 130-140s.  - Continue coreg 3.125 mg BID.  - We will continue to follow up to and post CABG.   4.Acute respiratory failure ([place]) - Extubated 11/09/17. Stable.   5.Hyperlipidemia LDL goal <70 - LDL 91  -On high dose statin - Hgb A1C 5.6.  6. Hypokalemia - K 3.3 today.  - Mg 1.7. Supp ordered.  - Keep K > 4.0 and Mg > 2.0 with VF/VT.   7. H/o Non-Hodgkins lymphoma 2009 - treated with chemo. No XRT - No change.   Medication concerns reviewed with patient and pharmacy team. Barriers identified: None at this time.   Length of Stay: 4  Annamaria Helling  11/12/2017, 9:11 AM  Advanced Heart Failure Team Pager [phone] (M-F; 7a - 4p)  Please contact [facility] for night-coverage after hours (4p -7a ) and weekends on [email or web address]  Patient seen and examined with the above-signed Advanced Practice Provider and/or Housestaff. I personally reviewed laboratory data, imaging studies and relevant notes. I independently examined the patient and formulated the important aspects of the plan. I have edited the note to reflect any of my changes or salient points. I have personally discussed the plan with the patient and/or family.  Doing well. No CP or HF. Walking halls without problem. Rhythm stable. K and mag low. Will supp. Tolerating b-blocker and heparin. Will add spiro. CABG in am.   Glori Bickers, MD  10:54 AM

## 2017-11-12 NOTE — Progress Notes (Addendum)
ANTICOAGULATION CONSULT NOTE  Pharmacy Consult for Heparin Indication: chest pain/ACS  No Known Allergies  Patient Measurements: Height: 6' (182.9 cm) Weight: 175 lb 4.3 oz (79.5 kg) IBW/kg (Calculated) : 77.6  Vital Signs: Temp: 98.6 F (37 C) (03/14 0749) Temp Source: Oral (03/14 0749) BP: 136/73 (03/14 0700) Pulse Rate: 72 (03/14 0700)  Labs: Recent Labs    11/10/17 0550 11/10/17 0806 11/10/17 1242 11/10/17 2029 11/10/17 2033 11/11/17 0230 11/12/17 0239  HGB 14.2  --   --   --   --  13.7 12.5*  12.3*  HCT 41.3  --   --   --   --  41.3 37.0*  36.5*  PLT 153  --   --   --   --  149* 148*  150  LABPROT  --   --   --   --   --   --  15.1  15.1  INR  --   --   --   --   --   --  1.20  1.20  HEPARINUNFRC 0.26*  --  0.33  --   --  0.49 0.32  CREATININE 0.71  --   --   --  0.66 0.71 0.63  0.62  TROPONINI  --  26.25* 23.89* 20.01*  --   --   --     Estimated Creatinine Clearance: 93 mL/min (by C-G formula based on SCr of 0.62 mg/dL).   Assessment: 72 year old post CPR with STEMI, now s/p cath awaiting possible CABG.  Tirofiban turned off by cards on 3/12. Plan for CABG tomorrow on 3/15.  Heparin level is therapeutic on 1250 units/hr at 0.32 - however, on lower end of goal. Hgb is 12.3, plts 150. No signs/symptoms of bleeding. No infusion issues per nursing.    Goal of Therapy:  Heparin level = 0.3 to 0.7 Monitor platelets by anticoagulation protocol: Yes   Plan:  Increase IV heparin to 1300 units/hr. Daily heparin level, CBC Monitor for signs/symptoms of bleeding Follow up after CABG on 3/15  Doylene Canard, PharmD Clinical Pharmacist  Pager: [phone] Clinical Phone for 11/12/2017 until 3:30pm: [phone] If after 3:30pm, please call main pharmacy at [phone]  11/12/2017 8:07 AM

## 2017-11-12 NOTE — Progress Notes (Signed)
Pre-op Cardiac Surgery  Carotid Findings:  1-39% ICA plaquing.  Vertebral artery flow is antegrade.   Upper Extremity Right Left  Brachial Pressures 113T 116T  Radial Waveforms T T  Ulnar Waveforms T T  Palmar Arch (Allen's Test) Doppler signal remains normal with radial compression and obliterates with ulnar compression Doppler signal obliterates with both radial and ulnar compression   Findings:      Lower  Extremity Right Left  Dorsalis Pedis    Anterior Tibial T T  Posterior Tibial T T  Ankle/Brachial Indices      Findings:

## 2017-11-13 ENCOUNTER — Inpatient Hospital Stay (HOSPITAL_COMMUNITY): Payer: PPO | Admitting: Certified Registered Nurse Anesthetist

## 2017-11-13 ENCOUNTER — Inpatient Hospital Stay (HOSPITAL_COMMUNITY): Payer: PPO

## 2017-11-13 ENCOUNTER — Encounter (HOSPITAL_COMMUNITY)
Admission: EM | Disposition: A | Discharge status: Home Health | Payer: Self-pay | Source: Home / Self Care | Attending: Cardiovascular Disease

## 2017-11-13 ENCOUNTER — Other Ambulatory Visit (HOSPITAL_COMMUNITY): Payer: PPO

## 2017-11-13 DIAGNOSIS — I2510 Atherosclerotic heart disease of native coronary artery without angina pectoris: Secondary | ICD-10-CM | POA: Diagnosis present

## 2017-11-13 DIAGNOSIS — Z951 Presence of aortocoronary bypass graft: Secondary | ICD-10-CM

## 2017-11-13 HISTORY — PX: TEE WITHOUT CARDIOVERSION: SHX5443

## 2017-11-13 HISTORY — PX: CORONARY ARTERY BYPASS GRAFT: SHX141

## 2017-11-13 LAB — POCT I-STAT, CHEM 8
BUN: 10 mg/dL (ref 6–20)
BUN: 8 mg/dL (ref 6–20)
BUN: 9 mg/dL (ref 6–20)
BUN: 8 mg/dL (ref 6–20)
BUN: 7 mg/dL (ref 6–20)
CALCIUM ION: 1.21 mmol/L (ref 1.15–1.40)
CALCIUM ION: 1.19 mmol/L (ref 1.15–1.40)
CALCIUM ION: 1.26 mmol/L (ref 1.15–1.40)
CHLORIDE: 103 mmol/L (ref 101–111)
CHLORIDE: 100 mmol/L — AB (ref 101–111)
CHLORIDE: 104 mmol/L (ref 101–111)
CHLORIDE: 101 mmol/L (ref 101–111)
CREATININE: 0.6 mg/dL — AB (ref 0.61–1.24)
Calcium, Ion: 1.01 mmol/L — ABNORMAL LOW (ref 1.15–1.40)
Calcium, Ion: 1.08 mmol/L — ABNORMAL LOW (ref 1.15–1.40)
Chloride: 103 mmol/L (ref 101–111)
Creatinine, Ser: 0.4 mg/dL — ABNORMAL LOW (ref 0.61–1.24)
Creatinine, Ser: 0.5 mg/dL — ABNORMAL LOW (ref 0.61–1.24)
Creatinine, Ser: 0.4 mg/dL — ABNORMAL LOW (ref 0.61–1.24)
Creatinine, Ser: 0.5 mg/dL — ABNORMAL LOW (ref 0.61–1.24)
GLUCOSE: 112 mg/dL — AB (ref 65–99)
GLUCOSE: 111 mg/dL — AB (ref 65–99)
GLUCOSE: 155 mg/dL — AB (ref 65–99)
Glucose, Bld: 116 mg/dL — ABNORMAL HIGH (ref 65–99)
Glucose, Bld: 138 mg/dL — ABNORMAL HIGH (ref 65–99)
HCT: 34 % — ABNORMAL LOW (ref 39.0–52.0)
HCT: 26 % — ABNORMAL LOW (ref 39.0–52.0)
HCT: 26 % — ABNORMAL LOW (ref 39.0–52.0)
HEMATOCRIT: 32 % — AB (ref 39.0–52.0)
HEMATOCRIT: 26 % — AB (ref 39.0–52.0)
HEMOGLOBIN: 8.8 g/dL — AB (ref 13.0–17.0)
Hemoglobin: 11.6 g/dL — ABNORMAL LOW (ref 13.0–17.0)
Hemoglobin: 10.9 g/dL — ABNORMAL LOW (ref 13.0–17.0)
Hemoglobin: 8.8 g/dL — ABNORMAL LOW (ref 13.0–17.0)
Hemoglobin: 8.8 g/dL — ABNORMAL LOW (ref 13.0–17.0)
POTASSIUM: 4.1 mmol/L (ref 3.5–5.1)
POTASSIUM: 4.3 mmol/L (ref 3.5–5.1)
Potassium: 4.2 mmol/L (ref 3.5–5.1)
Potassium: 4.3 mmol/L (ref 3.5–5.1)
Potassium: 3.8 mmol/L (ref 3.5–5.1)
SODIUM: 140 mmol/L (ref 135–145)
SODIUM: 137 mmol/L (ref 135–145)
SODIUM: 140 mmol/L (ref 135–145)
Sodium: 140 mmol/L (ref 135–145)
Sodium: 139 mmol/L (ref 135–145)
TCO2: 27 mmol/L (ref 22–32)
TCO2: 27 mmol/L (ref 22–32)
TCO2: 27 mmol/L (ref 22–32)
TCO2: 26 mmol/L (ref 22–32)
TCO2: 24 mmol/L (ref 22–32)

## 2017-11-13 LAB — POCT I-STAT 3, ART BLOOD GAS (G3+)
ACID-BASE DEFICIT: 2 mmol/L (ref 0.0–2.0)
Acid-base deficit: 1 mmol/L (ref 0.0–2.0)
BICARBONATE: 24.5 mmol/L (ref 20.0–28.0)
BICARBONATE: 23.6 mmol/L (ref 20.0–28.0)
Bicarbonate: 23.8 mmol/L (ref 20.0–28.0)
Bicarbonate: 23 mmol/L (ref 20.0–28.0)
O2 SAT: 96 %
O2 SAT: 99 %
O2 Saturation: 100 %
O2 Saturation: 97 %
PCO2 ART: 34.2 mmHg (ref 32.0–48.0)
PH ART: 7.436 (ref 7.350–7.450)
PH ART: 7.436 (ref 7.350–7.450)
PO2 ART: 334 mmHg — AB (ref 83.0–108.0)
PO2 ART: 111 mmHg — AB (ref 83.0–108.0)
PO2 ART: 89 mmHg (ref 83.0–108.0)
Patient temperature: 36.9
Patient temperature: 37
TCO2: 26 mmol/L (ref 22–32)
TCO2: 25 mmol/L (ref 22–32)
TCO2: 24 mmol/L (ref 22–32)
TCO2: 25 mmol/L (ref 22–32)
pCO2 arterial: 38.5 mmHg (ref 32.0–48.0)
pCO2 arterial: 40.7 mmHg (ref 32.0–48.0)
pCO2 arterial: 35 mmHg (ref 32.0–48.0)
pH, Arterial: 7.412 (ref 7.350–7.450)
pH, Arterial: 7.371 (ref 7.350–7.450)
pO2, Arterial: 81 mmHg — ABNORMAL LOW (ref 83.0–108.0)

## 2017-11-13 LAB — GLUCOSE, CAPILLARY
GLUCOSE-CAPILLARY: 115 mg/dL — AB (ref 65–99)
GLUCOSE-CAPILLARY: 145 mg/dL — AB (ref 65–99)
GLUCOSE-CAPILLARY: 120 mg/dL — AB (ref 65–99)
Glucose-Capillary: 113 mg/dL — ABNORMAL HIGH (ref 65–99)
Glucose-Capillary: 132 mg/dL — ABNORMAL HIGH (ref 65–99)
Glucose-Capillary: 122 mg/dL — ABNORMAL HIGH (ref 65–99)
Glucose-Capillary: 116 mg/dL — ABNORMAL HIGH (ref 65–99)
Glucose-Capillary: 114 mg/dL — ABNORMAL HIGH (ref 65–99)
Glucose-Capillary: 110 mg/dL — ABNORMAL HIGH (ref 65–99)

## 2017-11-13 LAB — SURGICAL PCR SCREEN
MRSA, PCR: NEGATIVE
Staphylococcus aureus: NEGATIVE

## 2017-11-13 LAB — CBC
HEMATOCRIT: 32 % — AB (ref 39.0–52.0)
Hemoglobin: 10.9 g/dL — ABNORMAL LOW (ref 13.0–17.0)
MCH: 29.8 pg (ref 26.0–34.0)
MCHC: 34.1 g/dL (ref 30.0–36.0)
MCV: 87.4 fL (ref 78.0–100.0)
Platelets: 103 10*3/uL — ABNORMAL LOW (ref 150–400)
RBC: 3.66 MIL/uL — ABNORMAL LOW (ref 4.22–5.81)
RDW: 13.5 % (ref 11.5–15.5)
WBC: 9.9 10*3/uL (ref 4.0–10.5)

## 2017-11-13 LAB — PLATELET COUNT: Platelets: 118 10*3/uL — ABNORMAL LOW (ref 150–400)

## 2017-11-13 LAB — BASIC METABOLIC PANEL
Anion gap: 4 — ABNORMAL LOW (ref 5–15)
BUN: 9 mg/dL (ref 6–20)
CALCIUM: 7.8 mg/dL — AB (ref 8.9–10.3)
CO2: 25 mmol/L (ref 22–32)
CREATININE: 0.57 mg/dL — AB (ref 0.61–1.24)
Chloride: 110 mmol/L (ref 101–111)
GFR calc Af Amer: >60 mL/min (ref >60)
GFR calc non Af Amer: >60 mL/min (ref >60)
GLUCOSE: 126 mg/dL — AB (ref 65–99)
Potassium: 4.1 mmol/L (ref 3.5–5.1)
Sodium: 139 mmol/L (ref 135–145)

## 2017-11-13 LAB — APTT: aPTT: 31 seconds (ref 24–36)

## 2017-11-13 LAB — POCT I-STAT 4, (NA,K, GLUC, HGB,HCT)
GLUCOSE: 123 mg/dL — AB (ref 65–99)
HEMATOCRIT: 29 % — AB (ref 39.0–52.0)
HEMOGLOBIN: 9.9 g/dL — AB (ref 13.0–17.0)
POTASSIUM: 4 mmol/L (ref 3.5–5.1)
SODIUM: 142 mmol/L (ref 135–145)

## 2017-11-13 LAB — HEMOGLOBIN AND HEMATOCRIT, BLOOD
HCT: 27.6 % — ABNORMAL LOW (ref 39.0–52.0)
Hemoglobin: 9.4 g/dL — ABNORMAL LOW (ref 13.0–17.0)

## 2017-11-13 LAB — HEPARIN LEVEL (UNFRACTIONATED)

## 2017-11-13 LAB — PHOSPHORUS: Phosphorus: 3.3 mg/dL (ref 2.5–4.6)

## 2017-11-13 LAB — MAGNESIUM: Magnesium: 1.7 mg/dL (ref 1.7–2.4)

## 2017-11-13 SURGERY — CORONARY ARTERY BYPASS GRAFTING (CABG)
Anesthesia: General | Site: Chest

## 2017-11-13 MED ORDER — MAGNESIUM SULFATE 4 GM/100ML IV SOLN
4.0000 g | Freq: Once | INTRAVENOUS | Status: AC
  Administered 2017-11-13: 4 g via INTRAVENOUS
  Filled 2017-11-13: qty 100

## 2017-11-13 MED ORDER — LACTATED RINGERS IV SOLN
INTRAVENOUS | Status: DC
  Administered 2017-11-14: 03:00:00 via INTRAVENOUS

## 2017-11-13 MED ORDER — SODIUM CHLORIDE 0.9 % IV SOLN
250.0000 mL | INTRAVENOUS | Status: DC
  Administered 2017-11-14: 250 mL via INTRAVENOUS

## 2017-11-13 MED ORDER — ASPIRIN 81 MG PO CHEW: 324.0000 mg | CHEWABLE_TABLET | Freq: Every day | ORAL | Status: DC

## 2017-11-13 MED ORDER — VANCOMYCIN HCL IN DEXTROSE 1-5 GM/200ML-% IV SOLN
1000.0000 mg | Freq: Once | INTRAVENOUS | Status: AC
  Administered 2017-11-13: 1000 mg via INTRAVENOUS
  Filled 2017-11-13: qty 200

## 2017-11-13 MED ORDER — HEMOSTATIC AGENTS (NO CHARGE) OPTIME
TOPICAL | Status: DC | PRN
  Administered 2017-11-13 (×2): 1 via TOPICAL

## 2017-11-13 MED ORDER — PROPOFOL 10 MG/ML IV BOLUS
INTRAVENOUS | Status: DC | PRN
  Administered 2017-11-13: 50 mg via INTRAVENOUS

## 2017-11-13 MED ORDER — MORPHINE SULFATE (PF) 2 MG/ML IV SOLN
2.0000 mg | INTRAVENOUS | Status: DC | PRN
  Administered 2017-11-13: 4 mg via INTRAVENOUS
  Administered 2017-11-14 (×4): 2 mg via INTRAVENOUS
  Filled 2017-11-13 (×4): qty 1

## 2017-11-13 MED ORDER — NOREPINEPHRINE BITARTRATE 1 MG/ML IV SOLN
0.0000 ug/min | INTRAVENOUS | Status: DC
  Filled 2017-11-13: qty 4

## 2017-11-13 MED ORDER — SODIUM CHLORIDE 0.9 % IJ SOLN
OROMUCOSAL | Status: DC | PRN
  Administered 2017-11-13 (×3): 4 mL via TOPICAL

## 2017-11-13 MED ORDER — LACTATED RINGERS IV SOLN
INTRAVENOUS | Status: DC | PRN
  Administered 2017-11-13 (×2): via INTRAVENOUS

## 2017-11-13 MED ORDER — SUCCINYLCHOLINE CHLORIDE 200 MG/10ML IV SOSY
PREFILLED_SYRINGE | INTRAVENOUS | Status: AC
  Filled 2017-11-13: qty 10

## 2017-11-13 MED ORDER — METOPROLOL TARTRATE 25 MG/10 ML ORAL SUSPENSION: 12.5000 mg | Freq: Two times a day (BID) | ORAL | Status: DC

## 2017-11-13 MED ORDER — LIDOCAINE HCL (CARDIAC) 20 MG/ML IV SOLN
INTRAVENOUS | Status: AC
  Filled 2017-11-13: qty 5

## 2017-11-13 MED ORDER — FAMOTIDINE IN NACL 20-0.9 MG/50ML-% IV SOLN
20.0000 mg | Freq: Two times a day (BID) | INTRAVENOUS | Status: AC
  Administered 2017-11-13 (×2): 20 mg via INTRAVENOUS
  Filled 2017-11-13: qty 50

## 2017-11-13 MED ORDER — ROCURONIUM BROMIDE 10 MG/ML (PF) SYRINGE
PREFILLED_SYRINGE | INTRAVENOUS | Status: AC
  Filled 2017-11-13: qty 5

## 2017-11-13 MED ORDER — SODIUM CHLORIDE 0.9% FLUSH
3.0000 mL | Freq: Two times a day (BID) | INTRAVENOUS | Status: DC
  Administered 2017-11-14 (×2): 3 mL via INTRAVENOUS
  Administered 2017-11-15: 10 mL via INTRAVENOUS
  Administered 2017-11-15: 3 mL via INTRAVENOUS

## 2017-11-13 MED ORDER — ACETAMINOPHEN 160 MG/5ML PO SOLN: 1000.0000 mg | Freq: Four times a day (QID) | ORAL | Status: DC

## 2017-11-13 MED ORDER — PROTAMINE SULFATE 10 MG/ML IV SOLN
INTRAVENOUS | Status: DC | PRN
  Administered 2017-11-13: 20 mg via INTRAVENOUS
  Administered 2017-11-13: 280 mg via INTRAVENOUS

## 2017-11-13 MED ORDER — MIDAZOLAM HCL 10 MG/2ML IJ SOLN
INTRAMUSCULAR | Status: AC
  Filled 2017-11-13: qty 2

## 2017-11-13 MED ORDER — PANTOPRAZOLE SODIUM 40 MG PO TBEC
40.0000 mg | DELAYED_RELEASE_TABLET | Freq: Every day | ORAL | Status: DC
  Administered 2017-11-15: 40 mg via ORAL
  Filled 2017-11-13: qty 1

## 2017-11-13 MED ORDER — SODIUM CHLORIDE 0.9 % IV SOLN
INTRAVENOUS | Status: DC
  Filled 2017-11-13: qty 1

## 2017-11-13 MED ORDER — THROMBIN 20000 UNITS EX SOLR
CUTANEOUS | Status: DC | PRN
  Administered 2017-11-13: 20000 [IU] via TOPICAL

## 2017-11-13 MED ORDER — METOPROLOL TARTRATE 12.5 MG HALF TABLET
12.5000 mg | ORAL_TABLET | Freq: Two times a day (BID) | ORAL | Status: DC
  Administered 2017-11-14 – 2017-11-15 (×4): 12.5 mg via ORAL
  Filled 2017-11-13 (×5): qty 1

## 2017-11-13 MED ORDER — HEPARIN SODIUM (PORCINE) 1000 UNIT/ML IJ SOLN
INTRAMUSCULAR | Status: DC | PRN
  Administered 2017-11-13: 5000 [IU] via INTRAVENOUS
  Administered 2017-11-13: 28000 [IU] via INTRAVENOUS

## 2017-11-13 MED ORDER — EPHEDRINE 5 MG/ML INJ
INTRAVENOUS | Status: AC
  Filled 2017-11-13: qty 10

## 2017-11-13 MED ORDER — LACTATED RINGERS IV SOLN: 500.0000 mL | Freq: Once | INTRAVENOUS | Status: DC | PRN

## 2017-11-13 MED ORDER — ONDANSETRON HCL 4 MG/2ML IJ SOLN
INTRAMUSCULAR | Status: AC
  Filled 2017-11-13: qty 2

## 2017-11-13 MED ORDER — DEXAMETHASONE SODIUM PHOSPHATE 10 MG/ML IJ SOLN
INTRAMUSCULAR | Status: AC
  Filled 2017-11-13: qty 1

## 2017-11-13 MED ORDER — SODIUM CHLORIDE 0.9 % IJ SOLN
INTRAMUSCULAR | Status: AC
  Filled 2017-11-13: qty 10

## 2017-11-13 MED ORDER — ROCURONIUM BROMIDE 10 MG/ML (PF) SYRINGE
PREFILLED_SYRINGE | INTRAVENOUS | Status: DC | PRN
  Administered 2017-11-13 (×2): 50 mg via INTRAVENOUS
  Administered 2017-11-13: 20 mg via INTRAVENOUS
  Administered 2017-11-13: 50 mg via INTRAVENOUS
  Administered 2017-11-13: 20 mg via INTRAVENOUS
  Administered 2017-11-13 (×2): 50 mg via INTRAVENOUS

## 2017-11-13 MED ORDER — ONDANSETRON HCL 4 MG/2ML IJ SOLN
4.0000 mg | Freq: Four times a day (QID) | INTRAMUSCULAR | Status: DC | PRN
  Administered 2017-11-14: 4 mg via INTRAVENOUS
  Filled 2017-11-13: qty 2

## 2017-11-13 MED ORDER — MILRINONE LACTATE IN DEXTROSE 20-5 MG/100ML-% IV SOLN
INTRAVENOUS | Status: DC | PRN
  Administered 2017-11-13: 0.25 ug/kg/min via INTRAVENOUS

## 2017-11-13 MED ORDER — ASPIRIN EC 325 MG PO TBEC
325.0000 mg | DELAYED_RELEASE_TABLET | Freq: Every day | ORAL | Status: DC
  Administered 2017-11-14 – 2017-11-15 (×2): 325 mg via ORAL
  Filled 2017-11-13 (×2): qty 1

## 2017-11-13 MED ORDER — PROPOFOL 10 MG/ML IV BOLUS
INTRAVENOUS | Status: AC
  Filled 2017-11-13: qty 20

## 2017-11-13 MED ORDER — DOCUSATE SODIUM 100 MG PO CAPS
200.0000 mg | ORAL_CAPSULE | Freq: Every day | ORAL | Status: DC
  Administered 2017-11-14: 200 mg via ORAL
  Filled 2017-11-13: qty 2

## 2017-11-13 MED ORDER — DOPAMINE-DEXTROSE 3.2-5 MG/ML-% IV SOLN: 0.0000 ug/kg/min | INTRAVENOUS | Status: AC

## 2017-11-13 MED ORDER — PROTAMINE SULFATE 10 MG/ML IV SOLN
INTRAVENOUS | Status: AC
  Filled 2017-11-13: qty 25

## 2017-11-13 MED ORDER — NITROGLYCERIN IN D5W 200-5 MCG/ML-% IV SOLN: 0.0000 ug/min | INTRAVENOUS | Status: DC

## 2017-11-13 MED ORDER — MORPHINE SULFATE (PF) 2 MG/ML IV SOLN
1.0000 mg | INTRAVENOUS | Status: AC | PRN
  Filled 2017-11-13: qty 2

## 2017-11-13 MED ORDER — THROMBIN 20000 UNITS EX SOLR
CUTANEOUS | Status: AC
  Filled 2017-11-13: qty 20000

## 2017-11-13 MED ORDER — FENTANYL CITRATE (PF) 250 MCG/5ML IJ SOLN
INTRAMUSCULAR | Status: AC
  Filled 2017-11-13: qty 30

## 2017-11-13 MED ORDER — METOPROLOL TARTRATE 5 MG/5ML IV SOLN
2.5000 mg | INTRAVENOUS | Status: DC | PRN
  Administered 2017-11-14 (×2): 2.5 mg via INTRAVENOUS
  Filled 2017-11-13: qty 5

## 2017-11-13 MED ORDER — ACETAMINOPHEN 650 MG RE SUPP
650.0000 mg | Freq: Once | RECTAL | Status: AC
  Administered 2017-11-13: 650 mg via RECTAL

## 2017-11-13 MED ORDER — PROTAMINE SULFATE 10 MG/ML IV SOLN
INTRAVENOUS | Status: AC
  Filled 2017-11-13: qty 5

## 2017-11-13 MED ORDER — DEXAMETHASONE SODIUM PHOSPHATE 10 MG/ML IJ SOLN
INTRAMUSCULAR | Status: DC | PRN
  Administered 2017-11-13: 10 mg via INTRAVENOUS

## 2017-11-13 MED ORDER — OXYCODONE HCL 5 MG PO TABS
5.0000 mg | ORAL_TABLET | ORAL | Status: DC | PRN
  Administered 2017-11-14: 10 mg via ORAL
  Administered 2017-11-14 – 2017-11-15 (×3): 5 mg via ORAL
  Filled 2017-11-13 (×3): qty 1
  Filled 2017-11-13: qty 2

## 2017-11-13 MED ORDER — MILRINONE LACTATE IN DEXTROSE 20-5 MG/100ML-% IV SOLN
0.3000 ug/kg/min | INTRAVENOUS | Status: AC
  Administered 2017-11-13 – 2017-11-14 (×2): 0.25 ug/kg/min via INTRAVENOUS
  Administered 2017-11-15: 0.3 ug/kg/min via INTRAVENOUS
  Filled 2017-11-13 (×3): qty 100

## 2017-11-13 MED ORDER — MIDAZOLAM HCL 2 MG/2ML IJ SOLN: 2.0000 mg | INTRAMUSCULAR | Status: DC | PRN

## 2017-11-13 MED ORDER — 0.9 % SODIUM CHLORIDE (POUR BTL) OPTIME
TOPICAL | Status: DC | PRN
  Administered 2017-11-13: 6000 mL
  Administered 2017-11-13: 1000 mL

## 2017-11-13 MED ORDER — ACETAMINOPHEN 160 MG/5ML PO SOLN: 650.0000 mg | Freq: Once | ORAL | Status: AC

## 2017-11-13 MED ORDER — SODIUM CHLORIDE 0.9% FLUSH: 3.0000 mL | INTRAVENOUS | Status: DC | PRN

## 2017-11-13 MED ORDER — DEXMEDETOMIDINE HCL IN NACL 200 MCG/50ML IV SOLN
0.0000 ug/kg/h | INTRAVENOUS | Status: DC
  Administered 2017-11-13: 0.5 ug/kg/h via INTRAVENOUS
  Filled 2017-11-13: qty 50

## 2017-11-13 MED ORDER — ALBUMIN HUMAN 5 % IV SOLN
INTRAVENOUS | Status: DC | PRN
  Administered 2017-11-13: 13:00:00 via INTRAVENOUS

## 2017-11-13 MED ORDER — SODIUM CHLORIDE 0.45 % IV SOLN
INTRAVENOUS | Status: DC | PRN
  Administered 2017-11-13: 20 mL/h via INTRAVENOUS

## 2017-11-13 MED ORDER — POTASSIUM CHLORIDE 10 MEQ/50ML IV SOLN: 10.0000 meq | INTRAVENOUS | Status: AC

## 2017-11-13 MED ORDER — SODIUM CHLORIDE 0.9 % IV SOLN: INTRAVENOUS | Status: DC

## 2017-11-13 MED ORDER — BISACODYL 5 MG PO TBEC
10.0000 mg | DELAYED_RELEASE_TABLET | Freq: Every day | ORAL | Status: DC
  Administered 2017-11-14: 10 mg via ORAL
  Filled 2017-11-13: qty 2

## 2017-11-13 MED ORDER — ALBUMIN HUMAN 5 % IV SOLN
250.0000 mL | INTRAVENOUS | Status: AC | PRN
  Administered 2017-11-14: 250 mL via INTRAVENOUS
  Filled 2017-11-13: qty 250

## 2017-11-13 MED ORDER — CHLORHEXIDINE GLUCONATE 0.12 % MT SOLN
15.0000 mL | OROMUCOSAL | Status: AC
  Administered 2017-11-13: 15 mL via OROMUCOSAL

## 2017-11-13 MED ORDER — ACETAMINOPHEN 500 MG PO TABS
1000.0000 mg | ORAL_TABLET | Freq: Four times a day (QID) | ORAL | Status: DC
  Administered 2017-11-14 – 2017-11-16 (×10): 1000 mg via ORAL
  Filled 2017-11-13 (×10): qty 2

## 2017-11-13 MED ORDER — MIDAZOLAM HCL 5 MG/5ML IJ SOLN
INTRAMUSCULAR | Status: DC | PRN
  Administered 2017-11-13: 3 mg via INTRAVENOUS
  Administered 2017-11-13: 2 mg via INTRAVENOUS
  Administered 2017-11-13: 1 mg via INTRAVENOUS
  Administered 2017-11-13: 2 mg via INTRAVENOUS
  Administered 2017-11-13 (×2): 1 mg via INTRAVENOUS

## 2017-11-13 MED ORDER — INSULIN REGULAR BOLUS VIA INFUSION
0.0000 [IU] | Freq: Three times a day (TID) | INTRAVENOUS | Status: DC
  Filled 2017-11-13: qty 10

## 2017-11-13 MED ORDER — CEFAZOLIN SODIUM-DEXTROSE 2-4 GM/100ML-% IV SOLN
2.0000 g | Freq: Three times a day (TID) | INTRAVENOUS | Status: AC
  Administered 2017-11-13 – 2017-11-15 (×6): 2 g via INTRAVENOUS
  Filled 2017-11-13 (×6): qty 100

## 2017-11-13 MED ORDER — CALCIUM CHLORIDE 10 % IV SOLN
INTRAVENOUS | Status: AC
  Filled 2017-11-13: qty 10

## 2017-11-13 MED ORDER — NOREPINEPHRINE BITARTRATE 1 MG/ML IV SOLN
INTRAVENOUS | Status: DC | PRN
  Administered 2017-11-13: 6 ug/kg/min via INTRAVENOUS

## 2017-11-13 MED ORDER — LACTATED RINGERS IV SOLN: INTRAVENOUS | Status: DC

## 2017-11-13 MED ORDER — PHENYLEPHRINE 40 MCG/ML (10ML) SYRINGE FOR IV PUSH (FOR BLOOD PRESSURE SUPPORT)
PREFILLED_SYRINGE | INTRAVENOUS | Status: AC
  Filled 2017-11-13: qty 10

## 2017-11-13 MED ORDER — SODIUM CHLORIDE 0.9 % IV SOLN
0.0000 ug/min | INTRAVENOUS | Status: DC
  Filled 2017-11-13: qty 2

## 2017-11-13 MED ORDER — LIDOCAINE HCL (CARDIAC) 20 MG/ML IV SOLN
INTRAVENOUS | Status: DC | PRN
  Administered 2017-11-13: 80 mg via INTRAVENOUS

## 2017-11-13 MED ORDER — BISACODYL 10 MG RE SUPP: 10.0000 mg | Freq: Every day | RECTAL | Status: DC

## 2017-11-13 MED ORDER — TRAMADOL HCL 50 MG PO TABS: 50.0000 mg | ORAL_TABLET | ORAL | Status: DC | PRN

## 2017-11-13 MED ORDER — LACTATED RINGERS IV SOLN
INTRAVENOUS | Status: DC | PRN
  Administered 2017-11-13: 07:00:00 via INTRAVENOUS

## 2017-11-13 MED ORDER — FENTANYL CITRATE (PF) 250 MCG/5ML IJ SOLN
INTRAMUSCULAR | Status: DC | PRN
  Administered 2017-11-13: 100 ug via INTRAVENOUS
  Administered 2017-11-13 (×3): 250 ug via INTRAVENOUS
  Administered 2017-11-13: 100 ug via INTRAVENOUS
  Administered 2017-11-13: 50 ug via INTRAVENOUS
  Administered 2017-11-13 (×2): 250 ug via INTRAVENOUS

## 2017-11-13 SURGICAL SUPPLY — 77 items
ADH SKN CLS APL DERMABOND .7 (GAUZE/BANDAGES/DRESSINGS) ×2
AGENT HMST MTR 8 SURGIFLO (HEMOSTASIS) ×2
BAG DECANTER FOR FLEXI CONT (MISCELLANEOUS) ×3 IMPLANT
BANDAGE ACE 4X5 VEL STRL LF (GAUZE/BANDAGES/DRESSINGS) ×3 IMPLANT
BANDAGE ACE 6X5 VEL STRL LF (GAUZE/BANDAGES/DRESSINGS) ×3 IMPLANT
BLADE STERNUM SYSTEM 6 (BLADE) ×3 IMPLANT
BNDG GAUZE ELAST 4 BULKY (GAUZE/BANDAGES/DRESSINGS) ×3 IMPLANT
CANISTER SUCT 3000ML PPV (MISCELLANEOUS) ×3 IMPLANT
CATH CPB KIT GERHARDT (MISCELLANEOUS) ×3 IMPLANT
CATH THORACIC 28FR (CATHETERS) ×3 IMPLANT
CLIP VESOCCLUDE SM WIDE 24/CT (CLIP) ×1 IMPLANT
CRADLE DONUT ADULT HEAD (MISCELLANEOUS) ×2 IMPLANT
DERMABOND ADVANCED (GAUZE/BANDAGES/DRESSINGS) ×1
DERMABOND ADVANCED .7 DNX12 (GAUZE/BANDAGES/DRESSINGS) IMPLANT
DRAIN CHANNEL 28F RND 3/8 FF (WOUND CARE) ×3 IMPLANT
DRAPE CARDIOVASCULAR INCISE (DRAPES) ×3
DRAPE SLUSH/WARMER DISC (DRAPES) ×3 IMPLANT
DRAPE SRG 135X102X78XABS (DRAPES) ×2 IMPLANT
DRSG AQUACEL AG ADV 3.5X14 (GAUZE/BANDAGES/DRESSINGS) ×3 IMPLANT
ELECT BLADE 4.0 EZ CLEAN MEGAD (MISCELLANEOUS) ×3
ELECT REM PT RETURN 9FT ADLT (ELECTROSURGICAL) ×6
ELECTRODE BLDE 4.0 EZ CLN MEGD (MISCELLANEOUS) ×2 IMPLANT
ELECTRODE REM PT RTRN 9FT ADLT (ELECTROSURGICAL) ×4 IMPLANT
FELT TEFLON 1X6 (MISCELLANEOUS) ×5 IMPLANT
GAUZE SPONGE 4X4 12PLY STRL (GAUZE/BANDAGES/DRESSINGS) ×6 IMPLANT
GAUZE SPONGE 4X4 12PLY STRL LF (GAUZE/BANDAGES/DRESSINGS) ×2 IMPLANT
GLOVE BIO SURGEON STRL SZ 6.5 (GLOVE) ×12 IMPLANT
GLOVE BIOGEL PI IND STRL 6 (GLOVE) IMPLANT
GLOVE BIOGEL PI IND STRL 6.5 (GLOVE) IMPLANT
GLOVE BIOGEL PI INDICATOR 6 (GLOVE) ×2
GLOVE BIOGEL PI INDICATOR 6.5 (GLOVE) ×5
GOWN STRL REUS W/ TWL LRG LVL3 (GOWN DISPOSABLE) ×8 IMPLANT
GOWN STRL REUS W/ TWL XL LVL3 (GOWN DISPOSABLE) IMPLANT
GOWN STRL REUS W/TWL LRG LVL3 (GOWN DISPOSABLE) ×21
GOWN STRL REUS W/TWL XL LVL3 (GOWN DISPOSABLE) ×3
HEMOSTAT POWDER SURGIFOAM 1G (HEMOSTASIS) ×9 IMPLANT
HEMOSTAT SURGICEL 2X14 (HEMOSTASIS) ×3 IMPLANT
KIT BASIN OR (CUSTOM PROCEDURE TRAY) ×3 IMPLANT
KIT CATH SUCT 8FR (CATHETERS) ×3 IMPLANT
KIT ROOM TURNOVER OR (KITS) ×3 IMPLANT
KIT SUCTION CATH 14FR (SUCTIONS) ×6 IMPLANT
KIT VASOVIEW HEMOPRO VH 3000 (KITS) ×3 IMPLANT
LEAD PACING MYOCARDI (MISCELLANEOUS) ×3 IMPLANT
MARKER GRAFT CORONARY BYPASS (MISCELLANEOUS) ×10 IMPLANT
NS IRRIG 1000ML POUR BTL (IV SOLUTION) ×17 IMPLANT
PACK E OPEN HEART (SUTURE) ×3 IMPLANT
PACK OPEN HEART (CUSTOM PROCEDURE TRAY) ×3 IMPLANT
PAD ARMBOARD 7.5X6 YLW CONV (MISCELLANEOUS) ×6 IMPLANT
PAD ELECT DEFIB RADIOL ZOLL (MISCELLANEOUS) ×3 IMPLANT
PENCIL BUTTON HOLSTER BLD 10FT (ELECTRODE) ×3 IMPLANT
SET CARDIOPLEGIA MPS 5001102 (MISCELLANEOUS) ×1 IMPLANT
SPOGE SURGIFLO 8M (HEMOSTASIS) ×1
SPONGE LAP 18X18 X RAY DECT (DISPOSABLE) ×4 IMPLANT
SPONGE SURGIFLO 8M (HEMOSTASIS) IMPLANT
SUT BONE WAX W31G (SUTURE) ×3 IMPLANT
SUT MNCRL AB 4-0 PS2 18 (SUTURE) ×1 IMPLANT
SUT PROLENE 3 0 SH1 36 (SUTURE) ×3 IMPLANT
SUT PROLENE 4 0 TF (SUTURE) ×6 IMPLANT
SUT PROLENE 6 0 CC (SUTURE) ×8 IMPLANT
SUT PROLENE 7 0 BV 1 (SUTURE) ×4 IMPLANT
SUT PROLENE 7 0 BV1 MDA (SUTURE) ×5 IMPLANT
SUT PROLENE 8 0 BV175 6 (SUTURE) ×4 IMPLANT
SUT STEEL 6MS V (SUTURE) ×3 IMPLANT
SUT STEEL SZ 6 DBL 3X14 BALL (SUTURE) ×3 IMPLANT
SUT VIC AB 1 CTX 18 (SUTURE) ×6 IMPLANT
SUT VIC AB 2-0 CT1 27 (SUTURE) ×3
SUT VIC AB 2-0 CT1 TAPERPNT 27 (SUTURE) IMPLANT
SUT VIC AB 2-0 CTX 27 (SUTURE) ×1 IMPLANT
SYSTEM SAHARA CHEST DRAIN ATS (WOUND CARE) ×3 IMPLANT
TAPE CLOTH SURG 4X10 WHT LF (GAUZE/BANDAGES/DRESSINGS) ×1 IMPLANT
TAPE PAPER 2X10 WHT MICROPORE (GAUZE/BANDAGES/DRESSINGS) ×1 IMPLANT
TOWEL GREEN STERILE (TOWEL DISPOSABLE) ×2 IMPLANT
TOWEL GREEN STERILE FF (TOWEL DISPOSABLE) ×3 IMPLANT
TRAY FOLEY SILVER 16FR TEMP (SET/KITS/TRAYS/PACK) ×3 IMPLANT
TUBING INSUFFLATION (TUBING) ×3 IMPLANT
UNDERPAD 30X30 (UNDERPADS AND DIAPERS) ×3 IMPLANT
WATER STERILE IRR 1000ML POUR (IV SOLUTION) ×6 IMPLANT

## 2017-11-13 NOTE — Transfer of Care (Signed)
Immediate Anesthesia Transfer of Care Note  Patient: Edwin Morris  Procedure(s) Performed: CORONARY ARTERY BYPASS GRAFTING (CABG) times 5 using left internal mammary artery and right saphenous vein. (LIMA to LAD, SVG to DIAGONAL, SVG SEQUENTIALLY to DISTAL CIRCUMFLEX and OM1, SVG to RCA) (N/A Chest) TRANSESOPHAGEAL ECHOCARDIOGRAM (TEE) (N/A )  Patient Location: SICU  Anesthesia Type:General  Level of Consciousness: Patient remains intubated per anesthesia plan  Airway & Oxygen Therapy: Patient remains intubated per anesthesia plan  Post-op Assessment: Report given to RN and Post -op Vital signs reviewed and stable  Post vital signs: Reviewed and stable  Last Vitals:  Vitals:   11/13/17 0706 11/13/17 1400  BP:  (!) 116/53  Pulse: 68 89  Resp:  14  Temp:    SpO2: 92% 96%    Last Pain:  Vitals:   11/13/17 0408  TempSrc: Oral  PainSc:       Patients Stated Pain Goal: 0 (92/11/94 1740)  Complications: No apparent anesthesia complications

## 2017-11-13 NOTE — Progress Notes (Signed)
Initiated Open Heart Rapid Wean per Protocol

## 2017-11-13 NOTE — Anesthesia Procedure Notes (Signed)
Arterial Line Insertion Start/End3/15/2019 6:45 AM, 11/13/2017 6:55 AM Performed by: Izora Gala, CRNA, CRNA  Lidocaine 1% used for infiltration Right, radial was placed Catheter size: 20 G Hand hygiene performed  and maximum sterile barriers used   Attempts: 1 Procedure performed without using ultrasound guided technique. Following insertion, Biopatch. Post procedure assessment: unchanged  Patient tolerated the procedure well with no immediate complications.

## 2017-11-13 NOTE — Anesthesia Postprocedure Evaluation (Signed)
Anesthesia Post Note  Patient: Edwin Morris  Procedure(s) Performed: CORONARY ARTERY BYPASS GRAFTING (CABG) times 5 using left internal mammary artery and right saphenous vein. (LIMA to LAD, SVG to DIAGONAL, SVG SEQUENTIALLY to DISTAL CIRCUMFLEX and OM1, SVG to RCA) (N/A Chest) TRANSESOPHAGEAL ECHOCARDIOGRAM (TEE) (N/A )     Patient location during evaluation: SICU Anesthesia Type: General Level of consciousness: sedated and patient remains intubated per anesthesia plan Pain management: pain level controlled Vital Signs Assessment: post-procedure vital signs reviewed and stable Respiratory status: patient remains intubated per anesthesia plan and respiratory function stable Cardiovascular status: stable Postop Assessment: no apparent nausea or vomiting Anesthetic complications: no    Last Vitals:  Vitals:   11/13/17 1545 11/13/17 1600  BP: 109/70 105/63  Pulse: 89 88  Resp: (!) 23 15  Temp: 36.6 C 36.7 C  SpO2: 99% 99%    Last Pain:  Vitals:   11/13/17 0408  TempSrc: Oral  PainSc:                  Selenne Coggin,JAMES TERRILL

## 2017-11-13 NOTE — Anesthesia Preprocedure Evaluation (Addendum)
Anesthesia Evaluation  Patient identified by MRN, date of birth, ID band Patient awake    Airway Mallampati: II  TM Distance: >3 FB Neck ROM: Full    Dental   Pulmonary    breath sounds clear to auscultation       Cardiovascular + CAD and + Past MI   Rhythm:Regular Rate:Normal     Neuro/Psych    GI/Hepatic   Endo/Other    Renal/GU      Musculoskeletal   Abdominal   Peds  Hematology   Anesthesia Other Findings   Reproductive/Obstetrics                            Anesthesia Physical Anesthesia Plan  ASA: III  Anesthesia Plan: General   Post-op Pain Management:    Induction: Intravenous  PONV Risk Score and Plan: 3 and Treatment may vary due to age or medical condition, Dexamethasone and Ondansetron  Airway Management Planned:   Additional Equipment: Arterial line, PA Cath, TEE and Ultrasound Guidance Line Placement  Intra-op Plan:   Post-operative Plan: Post-operative intubation/ventilation  Informed Consent: I have reviewed the patients History and Physical, chart, labs and discussed the procedure including the risks, benefits and alternatives for the proposed anesthesia with the patient or authorized representative who has indicated his/her understanding and acceptance.   Dental advisory given  Plan Discussed with: CRNA  Anesthesia Plan Comments:         Anesthesia Quick Evaluation

## 2017-11-13 NOTE — Procedures (Signed)
Extubation Procedure Note  Patient Details:   Name: Edwin Morris DOB: 01-17-1946 MRN: [id number]   Airway Documentation:     Evaluation  O2 sats: stable throughout Complications: No apparent complications Patient did tolerate procedure well. Bilateral Breath Sounds: Diminished   Yes   Pt extubated to 4L [place] per Open Heart Rapid Wean Protocol. Pt with positive cuff leak prior to extubation. No complications noted, BS within normal limits. NIF -35, VC 1.14L, ABG within normal limits. Pt able to speak and has a weak non-productive cough post extubation. Pt encouraged to use Incentive Spirometer and Yankauer to clear secretions.   Jesse Sans 11/13/2017, 5:49 PM

## 2017-11-13 NOTE — Progress Notes (Signed)
  Echocardiogram 2D Echocardiogram has been performed.  Matilde Bash 11/13/2017, 8:30 AM

## 2017-11-13 NOTE — Anesthesia Procedure Notes (Signed)
Procedure Name: Intubation Date/Time: 11/13/2017 7:28 AM Performed by: Izora Gala, CRNA Pre-anesthesia Checklist: Patient identified, Emergency Drugs available, Suction available and Patient being monitored Patient Re-evaluated:Patient Re-evaluated prior to induction Oxygen Delivery Method: Circle system utilized Preoxygenation: Pre-oxygenation with 100% oxygen Induction Type: IV induction Ventilation: Mask ventilation without difficulty Laryngoscope Size: Miller and 3 Grade View: Grade II Tube type: Oral Tube size: 8.0 mm Number of attempts: 1 Placement Confirmation: ETT inserted through vocal cords under direct vision,  positive ETCO2 and breath sounds checked- equal and bilateral Secured at: 23 cm Tube secured with: Tape Dental Injury: Teeth and Oropharynx as per pre-operative assessment

## 2017-11-13 NOTE — Progress Notes (Signed)
Patient ID: Edwin Morris, male   DOB: Jan 07, 1946, 72 y.o.   MRN: [id number] EVENING ROUNDS NOTE :     [address]       [place] [address]             [phone]                 Day of Surgery Procedure(s) (LRB): CORONARY ARTERY BYPASS GRAFTING (CABG) times 5 using left internal mammary artery and right saphenous vein. (LIMA to LAD, SVG to DIAGONAL, SVG SEQUENTIALLY to DISTAL CIRCUMFLEX and OM1, SVG to RCA) (N/A) TRANSESOPHAGEAL ECHOCARDIOGRAM (TEE) (N/A)  Total Length of Stay:  LOS: 5 days  BP 98/65   Pulse 89   Temp 98.2 F (36.8 C)   Resp 12   Ht 6' (1.829 m)   Wt 175 lb 0.7 oz (79.4 kg)   SpO2 99%   BMI 23.74 kg/m   .Intake/Output      03/14 0701 - 03/15 0700 03/15 0701 - 03/16 0700   P.O. 10    I.V. (mL/kg) 272.4 (3.4) 2635 (33.2)   Blood  650   IV Piggyback 100 1450   Total Intake(mL/kg) 382.4 (4.8) 4735 (59.6)   Urine (mL/kg/hr) 600 (0.3) 3575 (4.2)   Stool     Blood  1000   Chest Tube  119   Total Output 600 4694   Net -217.6 +41        Urine Occurrence 4 x    Stool Occurrence 1 x      . sodium chloride 20 mL/hr (11/13/17 1400)  . [START ON 11/14/2017] sodium chloride    . sodium chloride 10 mL/hr (11/13/17 1400)  . albumin human    .  ceFAZolin (ANCEF) IV Stopped (11/13/17 1550)  . dexmedetomidine (PRECEDEX) IV infusion Stopped (11/13/17 1717)  . DOPamine 3 mcg/kg/min (11/13/17 1400)  . famotidine (PEPCID) IV Stopped (11/13/17 1505)  . insulin (NOVOLIN-R) infusion 1.9 Units/hr (11/13/17 1700)  . lactated ringers    . lactated ringers 20 mL/hr (11/13/17 1400)  . lactated ringers 20 mL/hr (11/13/17 1400)  . magnesium sulfate 4 g (11/13/17 1438)  . milrinone 0.25 mcg/kg/min (11/13/17 1400)  . nitroGLYCERIN Stopped (11/13/17 1415)  . norepinephrine (LEVOPHED) Adult infusion 2 mcg/min (11/13/17 1522)  . phenylephrine (NEO-SYNEPHRINE) Adult infusion Stopped (11/13/17 1400)  . vancomycin       Lab Results  Component Value Date     WBC 9.9 11/13/2017   HGB 9.9 (L) 11/13/2017   HCT 29.0 (L) 11/13/2017   PLT 103 (L) 11/13/2017   GLUCOSE 123 (H) 11/13/2017   CHOL 146 11/10/2017   TRIG 75 11/10/2017   HDL 40 (L) 11/10/2017   LDLCALC 91 11/10/2017   ALT 55 11/12/2017   ALT 56 11/12/2017   AST 65 (H) 11/12/2017   AST 66 (H) 11/12/2017   NA 142 11/13/2017   K 4.0 11/13/2017   CL 110 11/13/2017   CREATININE 0.57 (L) 11/13/2017   BUN 9 11/13/2017   CO2 25 11/13/2017   INR 1.20 11/12/2017   INR 1.20 11/12/2017   HGBA1C 5.6 11/11/2017   Now extubated  Not bleeding  On milrinone , dopamine and weaning levophed     Grace Isaac MD  Beeper [phone] Office [phone] 11/13/2017 5:44 PM

## 2017-11-13 NOTE — Anesthesia Procedure Notes (Signed)
Central Venous Catheter Insertion Performed by: Roderic Palau, MD, anesthesiologist Start/End3/15/2019 6:40 AM, 11/13/2017 6:55 AM Patient location: Pre-op. Preanesthetic checklist: patient identified, IV checked, site marked, risks and benefits discussed, surgical consent, monitors and equipment checked, pre-op evaluation, timeout performed and anesthesia consent Position: Trendelenburg Lidocaine 1% used for infiltration and patient sedated Hand hygiene performed , maximum sterile barriers used  and Seldinger technique used Catheter size: 9 Fr Total catheter length 10. Central line was placed.MAC introducer Procedure performed using ultrasound guided technique. Ultrasound Notes:anatomy identified, needle tip was noted to be adjacent to the nerve/plexus identified, no ultrasound evidence of intravascular and/or intraneural injection and image(s) printed for medical record Attempts: 1 Following insertion, line sutured, dressing applied and Biopatch. Post procedure assessment: blood return through all ports, free fluid flow and no air  Patient tolerated the procedure well with no immediate complications.

## 2017-11-13 NOTE — Progress Notes (Signed)
      [address]       [place] [address]             [phone]     Patient ID: Edwin Morris, male   DOB: 04-15-1946, 72 y.o.   MRN: [id number] Pre Procedure note for inpatients:   Edwin Morris has been scheduled for Procedure(s): CORONARY ARTERY BYPASS GRAFTING (CABG) (N/A) TRANSESOPHAGEAL ECHOCARDIOGRAM (TEE) (N/A) today. The various methods of treatment have been discussed with the patient. After consideration of the risks, benefits and treatment options the patient has consented to the planned procedure.   The patient has been seen and labs reviewed. There are no changes in the patient's condition to prevent proceeding with the planned procedure today.  Recent labs:  Lab Results  Component Value Date   WBC 7.1 11/12/2017   WBC 6.8 11/12/2017   HGB 12.3 (L) 11/12/2017   HGB 12.5 (L) 11/12/2017   HCT 36.5 (L) 11/12/2017   HCT 37.0 (L) 11/12/2017   PLT 150 11/12/2017   PLT 148 (L) 11/12/2017   GLUCOSE 110 (H) 11/12/2017   GLUCOSE 111 (H) 11/12/2017   CHOL 146 11/10/2017   TRIG 75 11/10/2017   HDL 40 (L) 11/10/2017   LDLCALC 91 11/10/2017   ALT 55 11/12/2017   ALT 56 11/12/2017   AST 65 (H) 11/12/2017   AST 66 (H) 11/12/2017   NA 139 11/12/2017   NA 139 11/12/2017   K 3.3 (L) 11/12/2017   K 3.4 (L) 11/12/2017   CL 106 11/12/2017   CL 107 11/12/2017   CREATININE 0.62 11/12/2017   CREATININE 0.63 11/12/2017   BUN 13 11/12/2017   BUN 13 11/12/2017   CO2 25 11/12/2017   CO2 26 11/12/2017   INR 1.20 11/12/2017   INR 1.20 11/12/2017   HGBA1C 5.6 11/11/2017    Grace Isaac, MD 11/13/2017 7:04 AM

## 2017-11-13 NOTE — Anesthesia Procedure Notes (Signed)
Central Venous Catheter Insertion Performed by: Roderic Palau, MD, anesthesiologist Start/End3/15/2019 6:40 AM, 11/13/2017 6:55 AM Patient location: Pre-op. Preanesthetic checklist: patient identified, IV checked, site marked, risks and benefits discussed, surgical consent, monitors and equipment checked, pre-op evaluation, timeout performed and anesthesia consent Hand hygiene performed  and maximum sterile barriers used  PA cath was placed.Swan type:thermodilution PA Cath depth:50 Procedure performed without using ultrasound guided technique. Attempts: 1 Patient tolerated the procedure well with no immediate complications.

## 2017-11-13 NOTE — Brief Op Note (Addendum)
11/13/2017  11:42 AM  PATIENT:  Edwin Morris  72 y.o. male  PRE-OPERATIVE DIAGNOSIS:  1. S/p STEMI 2. CAD  POST-OPERATIVE DIAGNOSIS:  1. S/p STEMI 2. CAD  PROCEDURE: TRANSESOPHAGEAL ECHOCARDIOGRAM (TEE) MEDIAN STERNOTOMY for  CORONARY ARTERY BYPASS GRAFTING (CABG) x 5 (LIMA to LAD, SVG to DIAGONAL, SVG SEQUENTIALLY to DISTAL CIRCUMFLEX and OM1, SVG to RCA)   using left internal mammary artery and right greater saphenous vein.   SURGEON:  Surgeon(s) and Role:    Grace Isaac, MD - Primary  PHYSICIAN ASSISTANT: Lars Pinks PA-C  ANESTHESIA:   general  EBL:  300 mL   DRAINS: Chest tubes placed in the mediastinal and pleural spaces   COUNTS CORRECT:  YES  DICTATION: .Dragon Dictation  PLAN OF CARE: Admit to inpatient   PATIENT DISPOSITION:  ICU - intubated and hemodynamically stable.   Delay start of Pharmacological VTE agent (>24hrs) due to surgical blood loss or risk of bleeding: yes  BASELINE WEIGHT: 79 kg

## 2017-11-14 ENCOUNTER — Inpatient Hospital Stay (HOSPITAL_COMMUNITY): Payer: PPO

## 2017-11-14 LAB — CULTURE, BLOOD (ROUTINE X 2)
CULTURE: NO GROWTH
Culture: NO GROWTH
SPECIAL REQUESTS: ADEQUATE
SPECIAL REQUESTS: ADEQUATE

## 2017-11-14 LAB — CREATININE, SERUM
CREATININE: 0.63 mg/dL (ref 0.61–1.24)
Creatinine, Ser: 0.68 mg/dL (ref 0.61–1.24)
GFR calc Af Amer: >60 mL/min (ref >60)
GFR calc non Af Amer: >60 mL/min (ref >60)

## 2017-11-14 LAB — BASIC METABOLIC PANEL
ANION GAP: 9 (ref 5–15)
BUN: 9 mg/dL (ref 6–20)
CO2: 22 mmol/L (ref 22–32)
Calcium: 8.1 mg/dL — ABNORMAL LOW (ref 8.9–10.3)
Chloride: 103 mmol/L (ref 101–111)
Creatinine, Ser: 0.56 mg/dL — ABNORMAL LOW (ref 0.61–1.24)
GLUCOSE: 108 mg/dL — AB (ref 65–99)
POTASSIUM: 4.1 mmol/L (ref 3.5–5.1)
Sodium: 134 mmol/L — ABNORMAL LOW (ref 135–145)

## 2017-11-14 LAB — CBC
HCT: 31.7 % — ABNORMAL LOW (ref 39.0–52.0)
HCT: 30.8 % — ABNORMAL LOW (ref 39.0–52.0)
HEMATOCRIT: 33 % — AB (ref 39.0–52.0)
Hemoglobin: 11.4 g/dL — ABNORMAL LOW (ref 13.0–17.0)
Hemoglobin: 10.9 g/dL — ABNORMAL LOW (ref 13.0–17.0)
Hemoglobin: 10.6 g/dL — ABNORMAL LOW (ref 13.0–17.0)
MCH: 29.5 pg (ref 26.0–34.0)
MCH: 29.7 pg (ref 26.0–34.0)
MCH: 29.8 pg (ref 26.0–34.0)
MCHC: 34.5 g/dL (ref 30.0–36.0)
MCHC: 34.4 g/dL (ref 30.0–36.0)
MCHC: 34.4 g/dL (ref 30.0–36.0)
MCV: 85.3 fL (ref 78.0–100.0)
MCV: 86.4 fL (ref 78.0–100.0)
MCV: 86.5 fL (ref 78.0–100.0)
PLATELETS: 112 10*3/uL — AB (ref 150–400)
PLATELETS: 117 10*3/uL — AB (ref 150–400)
Platelets: 112 10*3/uL — ABNORMAL LOW (ref 150–400)
RBC: 3.87 MIL/uL — AB (ref 4.22–5.81)
RBC: 3.67 MIL/uL — ABNORMAL LOW (ref 4.22–5.81)
RBC: 3.56 MIL/uL — AB (ref 4.22–5.81)
RDW: 13 % (ref 11.5–15.5)
RDW: 13.4 % (ref 11.5–15.5)
RDW: 13.6 % (ref 11.5–15.5)
WBC: 10.6 10*3/uL — ABNORMAL HIGH (ref 4.0–10.5)
WBC: 9.6 10*3/uL (ref 4.0–10.5)
WBC: 10.4 10*3/uL (ref 4.0–10.5)

## 2017-11-14 LAB — POCT I-STAT, CHEM 8
BUN: 13 mg/dL (ref 6–20)
CALCIUM ION: 1.2 mmol/L (ref 1.15–1.40)
CHLORIDE: 97 mmol/L — AB (ref 101–111)
CREATININE: 0.5 mg/dL — AB (ref 0.61–1.24)
GLUCOSE: 151 mg/dL — AB (ref 65–99)
HCT: 30 % — ABNORMAL LOW (ref 39.0–52.0)
Hemoglobin: 10.2 g/dL — ABNORMAL LOW (ref 13.0–17.0)
POTASSIUM: 4 mmol/L (ref 3.5–5.1)
Sodium: 134 mmol/L — ABNORMAL LOW (ref 135–145)
TCO2: 23 mmol/L (ref 22–32)

## 2017-11-14 LAB — MAGNESIUM
MAGNESIUM: 1.8 mg/dL (ref 1.7–2.4)
Magnesium: 2.2 mg/dL (ref 1.7–2.4)

## 2017-11-14 LAB — GLUCOSE, CAPILLARY
GLUCOSE-CAPILLARY: 105 mg/dL — AB (ref 65–99)
GLUCOSE-CAPILLARY: 125 mg/dL — AB (ref 65–99)
Glucose-Capillary: 101 mg/dL — ABNORMAL HIGH (ref 65–99)
Glucose-Capillary: 114 mg/dL — ABNORMAL HIGH (ref 65–99)
Glucose-Capillary: 104 mg/dL — ABNORMAL HIGH (ref 65–99)
Glucose-Capillary: 108 mg/dL — ABNORMAL HIGH (ref 65–99)
Glucose-Capillary: 111 mg/dL — ABNORMAL HIGH (ref 65–99)
Glucose-Capillary: 97 mg/dL (ref 65–99)
Glucose-Capillary: 105 mg/dL — ABNORMAL HIGH (ref 65–99)
Glucose-Capillary: 116 mg/dL — ABNORMAL HIGH (ref 65–99)
Glucose-Capillary: 170 mg/dL — ABNORMAL HIGH (ref 65–99)
Glucose-Capillary: 175 mg/dL — ABNORMAL HIGH (ref 65–99)
Glucose-Capillary: 163 mg/dL — ABNORMAL HIGH (ref 65–99)

## 2017-11-14 MED ORDER — ENOXAPARIN SODIUM 40 MG/0.4ML ~~LOC~~ SOLN
40.0000 mg | Freq: Every day | SUBCUTANEOUS | Status: DC
  Administered 2017-11-14 – 2017-11-16 (×3): 40 mg via SUBCUTANEOUS
  Filled 2017-11-14 (×3): qty 0.4

## 2017-11-14 MED ORDER — AMIODARONE HCL 200 MG PO TABS: 200.0000 mg | ORAL_TABLET | Freq: Every day | ORAL | Status: DC

## 2017-11-14 MED ORDER — INSULIN ASPART 100 UNIT/ML ~~LOC~~ SOLN
0.0000 [IU] | SUBCUTANEOUS | Status: DC
  Administered 2017-11-14: 2 [IU] via SUBCUTANEOUS
  Administered 2017-11-14 – 2017-11-15 (×3): 4 [IU] via SUBCUTANEOUS
  Administered 2017-11-15 (×4): 2 [IU] via SUBCUTANEOUS

## 2017-11-14 MED ORDER — AMIODARONE HCL 200 MG PO TABS
200.0000 mg | ORAL_TABLET | Freq: Two times a day (BID) | ORAL | Status: DC
  Administered 2017-11-15 – 2017-11-17 (×5): 200 mg via ORAL
  Filled 2017-11-14 (×6): qty 1

## 2017-11-14 MED ORDER — AMIODARONE LOAD VIA INFUSION
150.0000 mg | Freq: Once | INTRAVENOUS | Status: AC
  Administered 2017-11-14: 150 mg via INTRAVENOUS
  Filled 2017-11-14: qty 83.34

## 2017-11-14 MED ORDER — AMIODARONE HCL IN DEXTROSE 360-4.14 MG/200ML-% IV SOLN
30.0000 mg/h | INTRAVENOUS | Status: DC
  Filled 2017-11-14: qty 200

## 2017-11-14 MED ORDER — AMIODARONE HCL IN DEXTROSE 360-4.14 MG/200ML-% IV SOLN
60.0000 mg/h | INTRAVENOUS | Status: AC
  Administered 2017-11-14 (×2): 60 mg/h via INTRAVENOUS
  Filled 2017-11-14 (×2): qty 200

## 2017-11-14 NOTE — Progress Notes (Signed)
Patient ID: Edwin Morris, male   DOB: [birth year], 72 y.o.   MRN: [id number] TCTS DAILY ICU PROGRESS NOTE                   [address]            [place] [address]          [phone]   1 Day Post-Op Procedure(s) (LRB): CORONARY ARTERY BYPASS GRAFTING (CABG) times 5 using left internal mammary artery and right saphenous vein. (LIMA to LAD, SVG to DIAGONAL, SVG SEQUENTIALLY to DISTAL CIRCUMFLEX and OM1, SVG to RCA) (N/A) TRANSESOPHAGEAL ECHOCARDIOGRAM (TEE) (N/A)  Total Length of Stay:  LOS: 6 days   Subjective: Patient awake alert neurologically intact extubated last night without difficulty  Objective: Vital signs in last 24 hours: Temp:  [97.3 F (36.3 C)-99.3 F (37.4 C)] 98.8 F (37.1 C) (03/16 0744) Pulse Rate:  [88-100] 95 (03/16 0700) Cardiac Rhythm: Atrial paced (03/16 0400) Resp:  [8-25] 24 (03/16 0700) BP: (98-125)/(52-70) 116/66 (03/16 0700) SpO2:  [95 %-100 %] 97 % (03/16 0700) FiO2 (%):  [50 %] 50 % (03/15 1538) Weight:  [172 lb 9.9 oz (78.3 kg)] 172 lb 9.9 oz (78.3 kg) (03/16 0600)  Filed Weights   11/12/17 0500 11/13/17 0500 11/14/17 0600  Weight: 175 lb 4.3 oz (79.5 kg) 175 lb 0.7 oz (79.4 kg) 172 lb 9.9 oz (78.3 kg)    Weight change: -6.8 oz (-1.1 kg)   Hemodynamic parameters for last 24 hours: PAP: (14-26)/(4-17) 23/11 CO:  [4.9 L/min-5.8 L/min] 5.4 L/min CI:  [2.4 L/min/m2-2.9 L/min/m2] 2.7 L/min/m2  Intake/Output from previous day: 03/15 0701 - 03/16 0700 In: 5929.5 [I.V.:3829.5; Blood:650; IV [id number]:1324] Out: 4010 [Urine:5390; Blood:1000; Chest Tube:603]  Intake/Output this shift: No intake/output data recorded.  Current Meds: Scheduled Meds: . acetaminophen  1,000 mg Oral Q6H   Or  . acetaminophen (TYLENOL) oral liquid 160 mg/5 mL  1,000 mg Per Tube Q6H  . aspirin EC  325 mg Oral Daily   Or  . aspirin  324 mg Per Tube Daily  . atorvastatin  80 mg Oral q1800  . bisacodyl  10 mg Oral Daily   Or  . bisacodyl   10 mg Rectal Daily  . docusate sodium  200 mg Oral Daily  . insulin regular  0-10 Units Intravenous TID WC  . metoprolol tartrate  12.5 mg Oral BID   Or  . metoprolol tartrate  12.5 mg Per Tube BID  . [START ON 11/15/2017] pantoprazole  40 mg Oral Daily  . sodium chloride flush  3 mL Intravenous Q12H  . tamsulosin  0.4 mg Oral QHS   Continuous Infusions: . sodium chloride 20 mL/hr at 11/14/17 0700  . sodium chloride 250 mL (11/14/17 0700)  . sodium chloride 10 mL/hr at 11/14/17 0700  . albumin human    .  ceFAZolin (ANCEF) IV 2 g (11/14/17 0757)  . dexmedetomidine (PRECEDEX) IV infusion Stopped (11/13/17 1717)  . DOPamine 3.023 mcg/kg/min (11/14/17 0700)  . insulin (NOVOLIN-R) infusion 1.4 Units/hr (11/14/17 0700)  . lactated ringers    . lactated ringers 20 mL/hr at 11/14/17 0700  . lactated ringers 20 mL/hr at 11/14/17 0700  . milrinone 0.252 mcg/kg/min (11/14/17 0700)  . nitroGLYCERIN Stopped (11/13/17 1415)  . norepinephrine (LEVOPHED) Adult infusion Stopped (11/13/17 2201)  . phenylephrine (NEO-SYNEPHRINE) Adult infusion Stopped (11/13/17 1400)   PRN Meds:.sodium chloride, albumin human, lactated ringers, metoprolol tartrate, midazolam, morphine injection, ondansetron (ZOFRAN) IV,  oxyCODONE, sodium chloride flush, traMADol  General appearance: alert, cooperative and no distress Neurologic: intact Heart: regular rate and rhythm, S1, S2 normal, no murmur, click, rub or gallop Lungs: diminished breath sounds bibasilar Abdomen: soft, non-tender; bowel sounds normal; no masses,  no organomegaly Extremities: extremities normal, atraumatic, no cyanosis or edema and Homans sign is negative, no sign of DVT Wound: Dressing intact sternum stable  Lab Results: CBC: Recent Labs    11/13/17 1409 11/13/17 1419 11/14/17 0251  WBC 9.9  --  10.6*  HGB 10.9* 9.9* 11.4*  HCT 32.0* 29.0* 33.0*  PLT 103*  --  112*   BMET:  Recent Labs    11/13/17 1409 11/13/17 1419 11/14/17 0251    NA 139 142 134*  K 4.1 4.0 4.1  CL 110  --  103  CO2 25  --  22  GLUCOSE 126* 123* 108*  BUN 9  --  9  CREATININE 0.57*  --  0.56*  CALCIUM 7.8*  --  8.1*    CMET: Lab Results  Component Value Date   WBC 10.6 (H) 11/14/2017   HGB 11.4 (L) 11/14/2017   HCT 33.0 (L) 11/14/2017   PLT 112 (L) 11/14/2017   GLUCOSE 108 (H) 11/14/2017   CHOL 146 11/10/2017   TRIG 75 11/10/2017   HDL 40 (L) 11/10/2017   LDLCALC 91 11/10/2017   ALT 55 11/12/2017   ALT 56 11/12/2017   AST 65 (H) 11/12/2017   AST 66 (H) 11/12/2017   NA 134 (L) 11/14/2017   K 4.1 11/14/2017   CL 103 11/14/2017   CREATININE 0.56 (L) 11/14/2017   BUN 9 11/14/2017   CO2 22 11/14/2017   INR 1.20 11/12/2017   INR 1.20 11/12/2017   HGBA1C 5.6 11/11/2017      PT/INR:  Recent Labs    11/12/17 0239  LABPROT 15.1  15.1  INR 1.20  1.20   Radiology: Dg Chest Port 1 View  Result Date: 11/14/2017 CLINICAL DATA:  S/P CABG x 5 EXAM: PORTABLE CHEST 1 VIEW COMPARISON:  11/13/2017 FINDINGS: Swan-Ganz catheter, mediastinal drain, LEFT chest tube unchanged. No appreciable pneumothorax. Interval extubation without increased atelectasis. No pulmonary edema. IMPRESSION: 1. Extubation without complication. 2. Stable remaining support apparatus. 3. LEFT chest tube in place without appreciable pneumothorax. Electronically Signed   By: Suzy Bouchard M.D.   On: 11/14/2017 07:22   Dg Chest Port 1 View  Result Date: 11/13/2017 CLINICAL DATA:  Initial evaluation status post CABG. EXAM: PORTABLE CHEST 1 VIEW COMPARISON:  Prior radiograph from 11/12/2017. FINDINGS: Sequelae of interval CABG with median sternotomy wires and underlying CABG markers are seen. Patient is intubated with the tip of the endotracheal tube positioned approximately 2 cm above the carina. Enteric tube courses in the abdomen. Swan-Ganz catheter extending via a right IJ approach in place with tip overlying the main pulmonary outflow tract. Mild cardiomegaly stable.  Mediastinal silhouette normal. Lungs are hypoinflated. Left-sided chest tube in place with tip overlying the left upper lobe. Probable trace left apical pneumothorax. Perihilar vascular congestion without overt pulmonary edema. Patchy right greater than left opacities likely reflect atelectasis, although infiltrates not excluded. No discernible pleural effusion. Osseous structures unchanged. Multiple metallic retained foreign bodies again noted overlying the chest. IMPRESSION: 1. Postoperative changes from interval CABG. Support apparatus in satisfactory position as above. 2. Trace left apical pneumothorax with left-sided chest tube in place. 3. Shallow lung inflation with patchy right greater than left bibasilar opacities. Findings may reflect atelectasis and/or infiltrates.  Electronically Signed   By: Jeannine Boga M.D.   On: 11/13/2017 15:53     Assessment/Plan: S/P Procedure(s) (LRB): CORONARY ARTERY BYPASS GRAFTING (CABG) times 5 using left internal mammary artery and right saphenous vein. (LIMA to LAD, SVG to DIAGONAL, SVG SEQUENTIALLY to DISTAL CIRCUMFLEX and OM1, SVG to RCA) (N/A) TRANSESOPHAGEAL ECHOCARDIOGRAM (TEE) (N/A) Mobilize Diuresis d/c tubes/lines See progression orders Expected Acute  Blood - loss Anemia- continue to monitor  Renal function stable Wean drips   Grace Isaac 11/14/2017 8:59 AM

## 2017-11-14 NOTE — Progress Notes (Addendum)
Patient ID: Edwin Morris, male   DOB: 1946/04/05, 72 y.o.   MRN: [id number] EVENING ROUNDS NOTE :     [address]       [place] [address]             [phone]                 1 Day Post-Op Procedure(s) (LRB): CORONARY ARTERY BYPASS GRAFTING (CABG) times 5 using left internal mammary artery and right saphenous vein. (LIMA to LAD, SVG to DIAGONAL, SVG SEQUENTIALLY to DISTAL CIRCUMFLEX and OM1, SVG to RCA) (N/A) TRANSESOPHAGEAL ECHOCARDIOGRAM (TEE) (N/A)  Total Length of Stay:  LOS: 6 days  BP 109/68   Pulse 88   Temp 99 F (37.2 C)   Resp (!) 25   Ht 6' (1.829 m)   Wt 172 lb 9.9 oz (78.3 kg)   SpO2 91%   BMI 23.41 kg/m   .Intake/Output      03/16 0701 - 03/17 0700   P[address]O. 480   I.V. (mL/kg) 423.7 (5.4)   Blood    IV Piggyback 300   Total Intake(mL/kg) 1203.7 (15.4)   Urine (mL/kg/hr) 535 (0.6)   Blood    Chest Tube 130   Total Output 665   Net +538.7         . sodium chloride Stopped (11/14/17 1045)  . sodium chloride Stopped (11/14/17 0800)  . sodium chloride Stopped (11/14/17 1100)  .  ceFAZolin (ANCEF) IV Stopped (11/14/17 1557)  . dexmedetomidine (PRECEDEX) IV infusion Stopped (11/13/17 1717)  . insulin (NOVOLIN-R) infusion Stopped (11/14/17 1249)  . lactated ringers    . lactated ringers 20 mL/hr at 11/14/17 1800  . lactated ringers Stopped (11/14/17 0800)  . milrinone 0.25 mcg/kg/min (11/14/17 1249)  . nitroGLYCERIN Stopped (11/13/17 1415)  . norepinephrine (LEVOPHED) Adult infusion Stopped (11/13/17 2201)  . phenylephrine (NEO-SYNEPHRINE) Adult infusion Stopped (11/13/17 1400)     Lab Results  Component Value Date   WBC 10.4 11/14/2017   HGB 10.2 (L) 11/14/2017   HCT 30.0 (L) 11/14/2017   PLT 117 (L) 11/14/2017   GLUCOSE 151 (H) 11/14/2017   CHOL 146 11/10/2017   TRIG 75 11/10/2017   HDL 40 (L) 11/10/2017   LDLCALC 91 11/10/2017   ALT 55 11/12/2017   ALT 56 11/12/2017   AST 65 (H) 11/12/2017   AST 66 (H) 11/12/2017   NA 134 (L) 11/14/2017   K 4.0 11/14/2017   CL 97 (L) 11/14/2017   CREATININE 0.50 (L) 11/14/2017   BUN 13 11/14/2017   CO2 22 11/14/2017   INR 1.20 11/12/2017   INR 1.20 11/12/2017   HGBA1C 5.6 11/11/2017   Stable day Wean off milrinone in am    Grace Isaac MD  Beeper [phone] Office [phone] 11/14/2017 7:09 PM  Just after seeing went into afib

## 2017-11-14 NOTE — Progress Notes (Signed)
  Amiodarone Drug - Drug Interaction Consult Note Recommendations: No drug interaction identified.   Amiodarone is metabolized by the cytochrome P450 system and therefore has the potential to cause many drug interactions. Amiodarone has an average plasma half-life of 50 days (range 20 to 100 days).   There is potential for drug interactions to occur several weeks or months after stopping treatment and the onset of drug interactions may be slow after initiating amiodarone.   []  Statins: Increased risk of myopathy. Simvastatin- restrict dose to 20mg  daily. Other statins: counsel patients to report any muscle pain or weakness immediately.  []  Anticoagulants: Amiodarone can increase anticoagulant effect. Consider warfarin dose reduction. Patients should be monitored closely and the dose of anticoagulant altered accordingly, remembering that amiodarone levels take several weeks to stabilize.  []  Antiepileptics: Amiodarone can increase plasma concentration of phenytoin, the dose should be reduced. Note that small changes in phenytoin dose can result in large changes in levels. Monitor patient and counsel on signs of toxicity.  []  Beta blockers: increased risk of bradycardia, AV block and myocardial depression. Sotalol - avoid concomitant use.  []   Calcium channel blockers (diltiazem and verapamil): increased risk of bradycardia, AV block and myocardial depression.  []   Cyclosporine: Amiodarone increases levels of cyclosporine. Reduced dose of cyclosporine is recommended.  []  Digoxin dose should be halved when amiodarone is started.  []  Diuretics: increased risk of cardiotoxicity if hypokalemia occurs.  []  Oral hypoglycemic agents (glyburide, glipizide, glimepiride): increased risk of hypoglycemia. Patient's glucose levels should be monitored closely when initiating amiodarone therapy.   []  Drugs that prolong the QT interval:  Torsades de pointes risk may be increased with concurrent use - avoid  if possible.  Monitor QTc, also keep magnesium/potassium WNL if concurrent therapy can't be avoided. Marland Kitchen Antibiotics: e.g. fluoroquinolones, erythromycin. . Antiarrhythmics: e.g. quinidine, procainamide, disopyramide, sotalol. . Antipsychotics: e.g. phenothiazines, haloperidol.  . Lithium, tricyclic antidepressants, and methadone.  Thank You,  Manley Mason  11/14/2017 7:52 PM

## 2017-11-15 ENCOUNTER — Inpatient Hospital Stay (HOSPITAL_COMMUNITY): Payer: PPO

## 2017-11-15 LAB — CBC
HCT: 29.9 % — ABNORMAL LOW (ref 39.0–52.0)
Hemoglobin: 9.9 g/dL — ABNORMAL LOW (ref 13.0–17.0)
MCH: 28.6 pg (ref 26.0–34.0)
MCHC: 33.1 g/dL (ref 30.0–36.0)
MCV: 86.4 fL (ref 78.0–100.0)
Platelets: 118 10*3/uL — ABNORMAL LOW (ref 150–400)
RBC: 3.46 MIL/uL — ABNORMAL LOW (ref 4.22–5.81)
RDW: 13.2 % (ref 11.5–15.5)
WBC: 9.5 10*3/uL (ref 4.0–10.5)

## 2017-11-15 LAB — GLUCOSE, CAPILLARY
GLUCOSE-CAPILLARY: 148 mg/dL — AB (ref 65–99)
GLUCOSE-CAPILLARY: 115 mg/dL — AB (ref 65–99)
Glucose-Capillary: 123 mg/dL — ABNORMAL HIGH (ref 65–99)
Glucose-Capillary: 140 mg/dL — ABNORMAL HIGH (ref 65–99)
Glucose-Capillary: 169 mg/dL — ABNORMAL HIGH (ref 65–99)

## 2017-11-15 LAB — BASIC METABOLIC PANEL
Anion gap: 7 (ref 5–15)
BUN: 13 mg/dL (ref 6–20)
CO2: 26 mmol/L (ref 22–32)
Calcium: 7.8 mg/dL — ABNORMAL LOW (ref 8.9–10.3)
Chloride: 101 mmol/L (ref 101–111)
Creatinine, Ser: 0.63 mg/dL (ref 0.61–1.24)
GFR calc Af Amer: >60 mL/min (ref >60)
GFR calc non Af Amer: >60 mL/min (ref >60)
Glucose, Bld: 135 mg/dL — ABNORMAL HIGH (ref 65–99)
Potassium: 3.8 mmol/L (ref 3.5–5.1)
Sodium: 134 mmol/L — ABNORMAL LOW (ref 135–145)

## 2017-11-15 LAB — HEPATIC FUNCTION PANEL
ALT: 42 U/L (ref 17–63)
AST: 32 U/L (ref 15–41)
Albumin: 2.6 g/dL — ABNORMAL LOW (ref 3.5–5.0)
Alkaline Phosphatase: 48 U/L (ref 38–126)
Bilirubin, Direct: 0.2 mg/dL (ref 0.1–0.5)
Indirect Bilirubin: 0.6 mg/dL (ref 0.3–0.9)
Total Bilirubin: 0.8 mg/dL (ref 0.3–1.2)
Total Protein: 4.4 g/dL — ABNORMAL LOW (ref 6.5–8.1)

## 2017-11-15 LAB — COOXEMETRY PANEL
Carboxyhemoglobin: 1.8 % — ABNORMAL HIGH (ref 0.5–1.5)
Methemoglobin: 0.9 % (ref 0.0–1.5)
O2 Saturation: 65.4 %
Total hemoglobin: 11.1 g/dL — ABNORMAL LOW (ref 12.0–16.0)

## 2017-11-15 MED ORDER — ASPIRIN EC 81 MG PO TBEC: 81.0000 mg | DELAYED_RELEASE_TABLET | Freq: Every day | ORAL | Status: DC

## 2017-11-15 MED ORDER — CLOPIDOGREL BISULFATE 75 MG PO TABS
75.0000 mg | ORAL_TABLET | Freq: Every day | ORAL | Status: DC
  Administered 2017-11-15: 75 mg via ORAL
  Filled 2017-11-15: qty 1

## 2017-11-15 NOTE — Progress Notes (Signed)
Physical Therapy Treatment Patient Details Name: Edwin Morris MRN: [id number] DOB: 1946-04-04 Today's Date: 11/15/2017    History of Present Illness Pt is a 72 y.o. male admitted 11/08/17 with c/o chest pain in setting of STEMI; during EMS transport, developed VF arrest and received CPR and defibrillation with restoration of sinus rhythm. Intubated in ER; extubated 3/11. S/p emergent cath revealing severe multivessel disease. s/p CABG x5 on 3/15. PMH includes CAD, CHF, BPH.    PT Comments    Patient s/p CABG x5 and progressing well towards PT goals. Tolerated transfers and gait training with supervision for safety. Sp02 ranged from 88-94% on RA with mobility and HR stable in 100s bpm NSR. Bp stable as well. Pre activity BP 122/74, post activity BP 137/70. 2/4 DOE with sit to stands from chair without use of UEs. Reviewed sternal precautions. Most likely will progress to not needing RW for home. Will continue to follow and progress as tolerated.     Follow Up Recommendations  No PT follow up;Supervision - Intermittent     Equipment Recommendations  None recommended by PT    Recommendations for Other Services       Precautions / Restrictions Precautions Precautions: Sternal Precaution Booklet Issued: No Precaution Comments: Reviewed precautions Restrictions Weight Bearing Restrictions: Yes Other Position/Activity Restrictions: sternal precautions    Mobility  Bed Mobility Overal bed mobility: Needs Assistance Bed Mobility: Rolling;Sidelying to Sit Rolling: Supervision Sidelying to sit: Supervision;HOB elevated       General bed mobility comments: Maintained precautions holding heart pillow to get to EOB, no assist needed. + dizziness which resolved.   Transfers Overall transfer level: Needs assistance Equipment used: None Transfers: Sit to/from Stand Sit to Stand: Supervision         General transfer comment: Supervision for safety, no use of UEs to stand.  Transferred to chair post ambulation.  Ambulation/Gait Ambulation/Gait assistance: Modified independent (Device/Increase time) Ambulation Distance (Feet): 600 Feet Assistive device: Rolling walker (2 wheeled) Gait Pattern/deviations: Step-through pattern;Decreased stride length   Gait velocity interpretation: at or above normal speed for age/gender General Gait Details: Steady gait with RW. Sp02 ranged from 88-95% on RA. HR stable in 100s bpm. BP stable-122/74 pre activity and 137/70 post activity.    Stairs            Wheelchair Mobility    Modified Rankin (Stroke Patients Only)       Balance Overall balance assessment: Needs assistance Sitting-balance support: Feet supported;No upper extremity supported Sitting balance-Leahy Scale: Good     Standing balance support: During functional activity Standing balance-Leahy Scale: Fair Standing balance comment: Able to walk within room without RW.                             Cognition Arousal/Alertness: Awake/alert Behavior During Therapy: WFL for tasks assessed/performed Overall Cognitive Status: Within Functional Limits for tasks assessed                                        Exercises Other Exercises Other Exercises: Sit to stand x10 from chair holding heart pillow for strengthening and exercise    General Comments General comments (skin integrity, edema, etc.): Brother and sister in law present for session.      Pertinent Vitals/Pain Pain Assessment: Faces Faces Pain Scale: Hurts a little bit Pain Location: right groin  Pain Descriptors / Indicators: Sharp Pain Intervention(s): Monitored during session;Repositioned    Home Living                      Prior Function            PT Goals (current goals can now be found in the care plan section) Progress towards PT goals: Progressing toward goals    Frequency    Min 3X/week      PT Plan Current plan remains  appropriate    Co-evaluation              AM-PAC PT "6 Clicks" Daily Activity  Outcome Measure  Difficulty turning over in bed (including adjusting bedclothes, sheets and blankets)?: None Difficulty moving from lying on back to sitting on the side of the bed? : None Difficulty sitting down on and standing up from a chair with arms (e.g., wheelchair, bedside commode, etc,.)?: None Help needed moving to and from a bed to chair (including a wheelchair)?: None Help needed walking in hospital room?: None Help needed climbing 3-5 steps with a railing? : A Little 6 Click Score: 23    End of Session Equipment Utilized During Treatment: Gait belt Activity Tolerance: Patient tolerated treatment well Patient left: in chair;with call bell/phone within reach;with family/visitor present Nurse Communication: Mobility status PT Visit Diagnosis: Other abnormalities of gait and mobility (R26.89)     Time: 4196-2229 PT Time Calculation (min) (ACUTE ONLY): 23 min  Charges:  $Therapeutic Exercise: 8-22 mins $Therapeutic Activity: 8-22 mins                    G Codes:       Wray Kearns, PT, DPT [phone]     Marguarite Arbour A Langley Ingalls 11/15/2017, 2:24 PM

## 2017-11-15 NOTE — Progress Notes (Signed)
Patient ID: Edwin Morris, male   DOB: November 17, 1945, 72 y.o.   MRN: [id number] EVENING ROUNDS NOTE :     [address]       [place] [address]             [phone]                 2 Days Post-Op Procedure(s) (LRB): CORONARY ARTERY BYPASS GRAFTING (CABG) times 5 using left internal mammary artery and right saphenous vein. (LIMA to LAD, SVG to DIAGONAL, SVG SEQUENTIALLY to DISTAL CIRCUMFLEX and OM1, SVG to RCA) (N/A) TRANSESOPHAGEAL ECHOCARDIOGRAM (TEE) (N/A)  Total Length of Stay:  LOS: 7 days  BP 132/84   Pulse (!) 103   Temp 98.6 F (37 C) (Oral)   Resp (!) 28   Ht 6' (1.829 m)   Wt 179 lb 3.7 oz (81.3 kg)   SpO2 97%   BMI 24.31 kg/m   .Intake/Output      03/17 0701 - 03/18 0700   P.O. 720   I.V. (mL/kg) 93.6 (1.2)   IV Piggyback    Total Intake(mL/kg) 813.6 (10)   Urine (mL/kg/hr) 735 (0.7)   Stool 0   Chest Tube 50   Total Output 785   Net +28.6       Stool Occurrence 1 x     . sodium chloride Stopped (11/14/17 1045)  . sodium chloride Stopped (11/14/17 0800)  . sodium chloride Stopped (11/14/17 1100)  . amiodarone Stopped (11/15/17 0800)  . dexmedetomidine (PRECEDEX) IV infusion Stopped (11/13/17 1717)  . insulin (NOVOLIN-R) infusion Stopped (11/14/17 1249)  . lactated ringers    . lactated ringers Stopped (11/15/17 0800)  . lactated ringers Stopped (11/14/17 0800)  . nitroGLYCERIN Stopped (11/13/17 1415)  . norepinephrine (LEVOPHED) Adult infusion Stopped (11/13/17 2201)  . phenylephrine (NEO-SYNEPHRINE) Adult infusion Stopped (11/13/17 1400)     Lab Results  Component Value Date   WBC 9.5 11/15/2017   HGB 9.9 (L) 11/15/2017   HCT 29.9 (L) 11/15/2017   PLT 118 (L) 11/15/2017   GLUCOSE 135 (H) 11/15/2017   CHOL 146 11/10/2017   TRIG 75 11/10/2017   HDL 40 (L) 11/10/2017   LDLCALC 91 11/10/2017   ALT 42 11/15/2017   AST 32 11/15/2017   NA 134 (L) 11/15/2017   K 3.8 11/15/2017   CL 101 11/15/2017   CREATININE 0.63  11/15/2017   BUN 13 11/15/2017   CO2 26 11/15/2017   INR 1.20 11/12/2017   INR 1.20 11/12/2017   HGBA1C 5.6 11/11/2017   Stable day, no further a fib, on po Amiodarone   Grace Isaac MD  Beeper [phone] Office [phone] 11/15/2017 7:29 PM

## 2017-11-15 NOTE — Progress Notes (Signed)
Patient ID: Edwin Morris, male   DOB: 03/24/1946, 72 y.o.   MRN: [id number] TCTS DAILY ICU PROGRESS NOTE                   [address]            [place] [address]          [phone]   2 Days Post-Op Procedure(s) (LRB): CORONARY ARTERY BYPASS GRAFTING (CABG) times 5 using left internal mammary artery and right saphenous vein. (LIMA to LAD, SVG to DIAGONAL, SVG SEQUENTIALLY to DISTAL CIRCUMFLEX and OM1, SVG to RCA) (N/A) TRANSESOPHAGEAL ECHOCARDIOGRAM (TEE) (N/A)  Total Length of Stay:  LOS: 7 days   Subjective: Awake alert converted to sinus rhythm last night on IV amnio,  Objective: Vital signs in last 24 hours: Temp:  [98.1 F (36.7 C)-99 F (37.2 C)] 98.9 F (37.2 C) (03/17 0756) Pulse Rate:  [58-131] 94 (03/17 0900) Cardiac Rhythm: Normal sinus rhythm (03/17 0600) Resp:  [11-27] 21 (03/17 0700) BP: (83-149)/(55-76) 114/60 (03/17 0918) SpO2:  [91 %-99 %] 94 % (03/17 0900) Weight:  [179 lb 3.7 oz (81.3 kg)] 179 lb 3.7 oz (81.3 kg) (03/17 0630)  Filed Weights   11/13/17 0500 11/14/17 0600 11/15/17 0630  Weight: 175 lb 0.7 oz (79.4 kg) 172 lb 9.9 oz (78.3 kg) 179 lb 3.7 oz (81.3 kg)    Weight change: 6 lb 9.8 oz (3 kg)   Hemodynamic parameters for last 24 hours: PAP: (24-25)/(10-11) 24/11 CO:  [7 L/min] 7 L/min CI:  [3.5 L/min/m2] 3.5 L/min/m2  Intake/Output from previous day: 03/16 0701 - 03/17 0700 In: 1721.9 [P.O.:480; I.V.:941.9; IV Piggyback:300] Out: 1230 [Urine:960; Chest Tube:270]  Intake/Output this shift: No intake/output data recorded.  Current Meds: Scheduled Meds: . acetaminophen  1,000 mg Oral Q6H   Or  . acetaminophen (TYLENOL) oral liquid 160 mg/5 mL  1,000 mg Per Tube Q6H  . amiodarone  200 mg Oral Q12H   Followed by  . [START ON 11/22/2017] amiodarone  200 mg Oral Daily  . aspirin EC  325 mg Oral Daily   Or  . aspirin  324 mg Per Tube Daily  . atorvastatin  80 mg Oral q1800  . bisacodyl  10 mg Oral Daily   Or  .  bisacodyl  10 mg Rectal Daily  . docusate sodium  200 mg Oral Daily  . enoxaparin (LOVENOX) injection  40 mg Subcutaneous QHS  . insulin aspart  0-24 Units Subcutaneous Q4H  . metoprolol tartrate  12.5 mg Oral BID   Or  . metoprolol tartrate  12.5 mg Per Tube BID  . pantoprazole  40 mg Oral Daily  . sodium chloride flush  3 mL Intravenous Q12H  . tamsulosin  0.4 mg Oral QHS   Continuous Infusions: . sodium chloride Stopped (11/14/17 1045)  . sodium chloride Stopped (11/14/17 0800)  . sodium chloride Stopped (11/14/17 1100)  . amiodarone 30 mg/hr (11/15/17 0600)  . dexmedetomidine (PRECEDEX) IV infusion Stopped (11/13/17 1717)  . insulin (NOVOLIN-R) infusion Stopped (11/14/17 1249)  . lactated ringers    . lactated ringers 20 mL/hr at 11/15/17 0600  . lactated ringers Stopped (11/14/17 0800)  . milrinone 0.3 mcg/kg/min (11/15/17 0600)  . nitroGLYCERIN Stopped (11/13/17 1415)  . norepinephrine (LEVOPHED) Adult infusion Stopped (11/13/17 2201)  . phenylephrine (NEO-SYNEPHRINE) Adult infusion Stopped (11/13/17 1400)   PRN Meds:.sodium chloride, lactated ringers, metoprolol tartrate, midazolam, morphine injection, ondansetron (ZOFRAN) IV, oxyCODONE, sodium chloride flush, traMADol  General appearance: alert and cooperative Neurologic: intact Heart: regular rate and rhythm, S1, S2 normal, no murmur, click, rub or gallop Lungs: clear to auscultation bilaterally Abdomen: soft, non-tender; bowel sounds normal; no masses,  no organomegaly Extremities: extremities normal, atraumatic, no cyanosis or edema and Homans sign is negative, no sign of DVT Wound: Sternum is stable  Lab Results: CBC: Recent Labs    11/14/17 1704 11/14/17 1707 11/15/17 0420  WBC 10.4  --  9.5  HGB 10.6* 10.2* 9.9*  HCT 30.8* 30.0* 29.9*  PLT 117*  --  118*   BMET:  Recent Labs    11/14/17 0251  11/14/17 1707 11/15/17 0420  NA 134*  --  134* 134*  K 4.1  --  4.0 3.8  CL 103  --  97* 101  CO2 22  --    --  26  GLUCOSE 108*  --  151* 135*  BUN 9  --  13 13  CREATININE 0.56*   < > 0.50* 0.63  CALCIUM 8.1*  --   --  7.8*   < > = values in this interval not displayed.    CMET: Lab Results  Component Value Date   WBC 9.5 11/15/2017   HGB 9.9 (L) 11/15/2017   HCT 29.9 (L) 11/15/2017   PLT 118 (L) 11/15/2017   GLUCOSE 135 (H) 11/15/2017   CHOL 146 11/10/2017   TRIG 75 11/10/2017   HDL 40 (L) 11/10/2017   LDLCALC 91 11/10/2017   ALT 42 11/15/2017   AST 32 11/15/2017   NA 134 (L) 11/15/2017   K 3.8 11/15/2017   CL 101 11/15/2017   CREATININE 0.63 11/15/2017   BUN 13 11/15/2017   CO2 26 11/15/2017   INR 1.20 11/12/2017   INR 1.20 11/12/2017   HGBA1C 5.6 11/11/2017      PT/INR: No results for input(s): LABPROT, INR in the last 72 hours. Radiology: Dg Chest Port 1 View  Result Date: 11/15/2017 CLINICAL DATA:  Postoperative evaluation EXAM: PORTABLE CHEST 1 VIEW COMPARISON:  Chest radiograph 11/14/2017 FINDINGS: Right IJ sheath projects over the superior vena cava. Left chest tube remains in place. Stable cardiac and mediastinal contours status post median sternotomy. Monitoring leads overlie the patient. Unchanged right-greater-than-left mid lower lung heterogeneous opacities. No pleural effusion or pneumothorax. IMPRESSION: Interval retraction PA catheter. Otherwise stable chest radiograph. Electronically Signed   By: Lovey Newcomer M.D.   On: 11/15/2017 09:29     Assessment/Plan: S/P Procedure(s) (LRB): CORONARY ARTERY BYPASS GRAFTING (CABG) times 5 using left internal mammary artery and right saphenous vein. (LIMA to LAD, SVG to DIAGONAL, SVG SEQUENTIALLY to DISTAL CIRCUMFLEX and OM1, SVG to RCA) (N/A) TRANSESOPHAGEAL ECHOCARDIOGRAM (TEE) (N/A) Mobilize Diuresis See progression orders Convert to p.o. amnio Wean off milrinone DC chest tube With admission as acute STEMI will continue Plavix and aspirin   Grace Isaac 11/15/2017 9:39 AM

## 2017-11-16 ENCOUNTER — Inpatient Hospital Stay (HOSPITAL_COMMUNITY): Payer: PPO

## 2017-11-16 ENCOUNTER — Encounter (HOSPITAL_COMMUNITY): Payer: Self-pay | Admitting: Cardiothoracic Surgery

## 2017-11-16 DIAGNOSIS — I200 Unstable angina: Secondary | ICD-10-CM

## 2017-11-16 DIAGNOSIS — Z951 Presence of aortocoronary bypass graft: Secondary | ICD-10-CM

## 2017-11-16 LAB — BASIC METABOLIC PANEL
Anion gap: 5 (ref 5–15)
BUN: 12 mg/dL (ref 6–20)
CO2: 25 mmol/L (ref 22–32)
Calcium: 7.9 mg/dL — ABNORMAL LOW (ref 8.9–10.3)
Chloride: 104 mmol/L (ref 101–111)
Creatinine, Ser: 0.66 mg/dL (ref 0.61–1.24)
GFR calc Af Amer: >60 mL/min (ref >60)
GFR calc non Af Amer: >60 mL/min (ref >60)
Glucose, Bld: 113 mg/dL — ABNORMAL HIGH (ref 65–99)
Potassium: 3.7 mmol/L (ref 3.5–5.1)
Sodium: 134 mmol/L — ABNORMAL LOW (ref 135–145)

## 2017-11-16 LAB — CBC
HCT: 28.8 % — ABNORMAL LOW (ref 39.0–52.0)
Hemoglobin: 9.8 g/dL — ABNORMAL LOW (ref 13.0–17.0)
MCH: 29.4 pg (ref 26.0–34.0)
MCHC: 34 g/dL (ref 30.0–36.0)
MCV: 86.5 fL (ref 78.0–100.0)
Platelets: 130 10*3/uL — ABNORMAL LOW (ref 150–400)
RBC: 3.33 MIL/uL — ABNORMAL LOW (ref 4.22–5.81)
RDW: 13.3 % (ref 11.5–15.5)
WBC: 7.7 10*3/uL (ref 4.0–10.5)

## 2017-11-16 LAB — GLUCOSE, CAPILLARY
GLUCOSE-CAPILLARY: 110 mg/dL — AB (ref 65–99)
GLUCOSE-CAPILLARY: 117 mg/dL — AB (ref 65–99)
GLUCOSE-CAPILLARY: 118 mg/dL — AB (ref 65–99)
GLUCOSE-CAPILLARY: 136 mg/dL — AB (ref 65–99)
GLUCOSE-CAPILLARY: 137 mg/dL — AB (ref 65–99)
Glucose-Capillary: 119 mg/dL — ABNORMAL HIGH (ref 65–99)

## 2017-11-16 MED ORDER — ONDANSETRON HCL 4 MG PO TABS
4.0000 mg | ORAL_TABLET | Freq: Four times a day (QID) | ORAL | Status: DC | PRN
Start: 2017-11-16 — End: 2017-11-17

## 2017-11-16 MED ORDER — METOPROLOL TARTRATE 12.5 MG HALF TABLET
12.5000 mg | ORAL_TABLET | Freq: Two times a day (BID) | ORAL | Status: DC
  Administered 2017-11-16: 12.5 mg via ORAL
  Filled 2017-11-16: qty 1

## 2017-11-16 MED ORDER — BISACODYL 5 MG PO TBEC: 10.0000 mg | DELAYED_RELEASE_TABLET | Freq: Every day | ORAL | Status: DC | PRN

## 2017-11-16 MED ORDER — FINASTERIDE 5 MG PO TABS
5.0000 mg | ORAL_TABLET | Freq: Every day | ORAL | Status: DC
  Administered 2017-11-16 – 2017-11-17 (×2): 5 mg via ORAL
  Filled 2017-11-16 (×2): qty 1

## 2017-11-16 MED ORDER — ACETAMINOPHEN 325 MG PO TABS: 650.0000 mg | ORAL_TABLET | Freq: Four times a day (QID) | ORAL | Status: DC | PRN

## 2017-11-16 MED ORDER — SPIRONOLACTONE 12.5 MG HALF TABLET
12.5000 mg | ORAL_TABLET | Freq: Every day | ORAL | Status: DC
  Administered 2017-11-16 – 2017-11-17 (×2): 12.5 mg via ORAL
  Filled 2017-11-16 (×2): qty 1

## 2017-11-16 MED ORDER — POTASSIUM CHLORIDE CRYS ER 20 MEQ PO TBCR
20.0000 meq | EXTENDED_RELEASE_TABLET | ORAL | Status: DC | PRN
  Administered 2017-11-16: 20 meq via ORAL
  Filled 2017-11-16: qty 1

## 2017-11-16 MED ORDER — CLOPIDOGREL BISULFATE 75 MG PO TABS
75.0000 mg | ORAL_TABLET | Freq: Every day | ORAL | Status: DC
  Administered 2017-11-16 – 2017-11-17 (×2): 75 mg via ORAL
  Filled 2017-11-16 (×2): qty 1

## 2017-11-16 MED ORDER — OXYCODONE HCL 5 MG PO TABS: 5.0000 mg | ORAL_TABLET | ORAL | Status: DC | PRN

## 2017-11-16 MED ORDER — SODIUM CHLORIDE 0.9 % IV SOLN: 250.0000 mL | INTRAVENOUS | Status: DC | PRN

## 2017-11-16 MED ORDER — INSULIN ASPART 100 UNIT/ML ~~LOC~~ SOLN
0.0000 [IU] | Freq: Three times a day (TID) | SUBCUTANEOUS | Status: DC
  Administered 2017-11-16: 2 [IU] via SUBCUTANEOUS

## 2017-11-16 MED ORDER — TRAMADOL HCL 50 MG PO TABS: 50.0000 mg | ORAL_TABLET | ORAL | Status: DC | PRN

## 2017-11-16 MED ORDER — PANTOPRAZOLE SODIUM 40 MG PO TBEC
40.0000 mg | DELAYED_RELEASE_TABLET | Freq: Every day | ORAL | Status: DC
  Administered 2017-11-16 – 2017-11-17 (×2): 40 mg via ORAL
  Filled 2017-11-16 (×2): qty 1

## 2017-11-16 MED ORDER — CARVEDILOL 3.125 MG PO TABS
3.1250 mg | ORAL_TABLET | Freq: Two times a day (BID) | ORAL | Status: DC
  Administered 2017-11-16 – 2017-11-17 (×3): 3.125 mg via ORAL
  Filled 2017-11-16 (×3): qty 1

## 2017-11-16 MED ORDER — ONDANSETRON HCL 4 MG/2ML IJ SOLN: 4.0000 mg | Freq: Four times a day (QID) | INTRAMUSCULAR | Status: DC | PRN

## 2017-11-16 MED ORDER — ASPIRIN EC 81 MG PO TBEC
81.0000 mg | DELAYED_RELEASE_TABLET | Freq: Every day | ORAL | Status: DC
  Administered 2017-11-16 – 2017-11-17 (×2): 81 mg via ORAL
  Filled 2017-11-16 (×2): qty 1

## 2017-11-16 MED ORDER — BISACODYL 10 MG RE SUPP: 10.0000 mg | Freq: Every day | RECTAL | Status: DC | PRN

## 2017-11-16 MED ORDER — MAGNESIUM SULFATE 50 % IJ SOLN
3.0000 g | Freq: Once | INTRAVENOUS | Status: DC
  Filled 2017-11-16: qty 6

## 2017-11-16 MED ORDER — SODIUM CHLORIDE 0.9% FLUSH
3.0000 mL | Freq: Two times a day (BID) | INTRAVENOUS | Status: DC
  Administered 2017-11-16 – 2017-11-17 (×2): 3 mL via INTRAVENOUS

## 2017-11-16 MED ORDER — MOVING RIGHT ALONG BOOK
Freq: Once | Status: AC
  Administered 2017-11-16: 10:00:00
  Filled 2017-11-16: qty 1

## 2017-11-16 MED ORDER — SODIUM CHLORIDE 0.9% FLUSH: 3.0000 mL | INTRAVENOUS | Status: DC | PRN

## 2017-11-16 MED ORDER — DOCUSATE SODIUM 100 MG PO CAPS
200.0000 mg | ORAL_CAPSULE | Freq: Every day | ORAL | Status: DC
  Administered 2017-11-16: 200 mg via ORAL
  Filled 2017-11-16 (×2): qty 2

## 2017-11-16 NOTE — Progress Notes (Signed)
Patient and wife has chosen to not receive ordered dose of magnesium for this afternoon. She stated that he has had his magnesium replaced twice that she can remember and would like to wait till tomorrow to have the level rechecked. RN has paged MD. RN spoke with pharmacist and he could not give reason for dose today as lab was from 3/16. Will pass on that mag level needs to be re-addressed in the morning.

## 2017-11-16 NOTE — Progress Notes (Signed)
11/16/2017 1845 Received pt to room 4E-07 from [place].  Pt is A&O, no C/O voiced at this time.  Tele monitor applied and CCMD notified.  Oriented to room, call light and bed.  Call bell in reach, family at bedside. [place]

## 2017-11-16 NOTE — Progress Notes (Signed)
Report called to [facility] on 4E, patient going to room 7. All personal belongings accounted for. Patient eating dinner, will transfer as soon as he is finished.

## 2017-11-16 NOTE — Progress Notes (Signed)
2nd attempt to call report to 4E.

## 2017-11-16 NOTE — Progress Notes (Addendum)
Advanced Heart Failure Rounding Note  PCP-Cardiologist: Shelva Majestic, MD   Subjective:    S/p CABG 11/13/17  Feeling OK this am. Denies SOB, further CP or arm pain. No lightheadedness or dizziness walking halls. Doing IS.  No peripheral edema. Walked with PT and did well.   Creatinine 0.62. K 3.3. Mg 1.7. Supp ordered.   Objective:   Weight Range: 176 lb 3.2 oz (79.9 kg) Body mass index is 23.9 kg/m.   Vital Signs:   Temp:  [98.3 F (36.8 C)-99.1 F (37.3 C)] 98.3 F (36.8 C) (03/18 0800) Pulse Rate:  [83-103] 103 (03/17 1400) Resp:  [14-28] 22 (03/18 0900) BP: (99-135)/(50-95) 111/62 (03/18 0900) SpO2:  [95 %-98 %] 97 % (03/18 0000) Weight:  [176 lb 3.2 oz (79.9 kg)] 176 lb 3.2 oz (79.9 kg) (03/18 0500) Last BM Date: 11/15/17  Weight change: Filed Weights   11/14/17 0600 11/15/17 0630 11/16/17 0500  Weight: 172 lb 9.9 oz (78.3 kg) 179 lb 3.7 oz (81.3 kg) 176 lb 3.2 oz (79.9 kg)   Intake/Output:   Intake/Output Summary (Last 24 hours) at 11/16/2017 1012 Last data filed at 11/16/2017 0900 Gross per 24 hour  Intake 1140 ml  Output 1705 ml  Net -565 ml    Physical Exam    General:  Well appearing. No resp difficulty. Sitting in the chair.  HEENT: normal Neck: supple. no JVD. Carotids 2+ bilat; no bruits. No lymphadenopathy or thryomegaly appreciated. Cor: PMI nondisplaced. Regular rate & rhythm. No rubs, gallops or murmurs.Sternal incision approximated.  Lungs: clear Abdomen: soft, nontender, nondistended. No hepatosplenomegaly. No bruits or masses. Good bowel sounds. Extremities: no cyanosis, clubbing, rash, edema Neuro: alert & orientedx3, cranial nerves grossly intact. moves all 4 extremities w/o difficulty. Affect pleasant   Telemetry   NSR 80-90s personally reviewed.   EKG    No new tracings.    Labs    CBC Recent Labs    11/15/17 0420 11/16/17 0339  WBC 9.5 7.7  HGB 9.9* 9.8*  HCT 29.9* 28.8*  MCV 86.4 86.5  PLT 118* 130*   Basic  Metabolic Panel Recent Labs    11/13/17 1409  11/14/17 0251  11/14/17 1704  11/15/17 0420 11/16/17 0339  NA 139   < > 134*  --   --    < > 134* 134*  K 4.1   < > 4.1  --   --    < > 3.8 3.7  CL 110  --  103  --   --    < > 101 104  CO2 25  --  22  --   --   --  26 25  GLUCOSE 126*   < > 108*  --   --    < > 135* 113*  BUN 9  --  9  --   --    < > 13 12  CREATININE 0.57*  --  0.56*   < > 0.63   < > 0.63 0.66  CALCIUM 7.8*  --  8.1*  --   --   --  7.8* 7.9*  MG 1.7  --  2.2  --  1.8  --   --   --   PHOS 3.3  --   --   --   --   --   --   --    < > = values in this interval not displayed.   Liver Function Tests Recent Labs  11/15/17 0420  AST 32  ALT 42  ALKPHOS 48  BILITOT 0.8  PROT 4.4*  ALBUMIN 2.6*   No results for input(s): LIPASE, AMYLASE in the last 72 hours. Cardiac Enzymes No results for input(s): CKTOTAL, CKMB, CKMBINDEX, TROPONINI in the last 72 hours.  BNP: BNP (last 3 results) No results for input(s): BNP in the last 8760 hours.  ProBNP (last 3 results) No results for input(s): PROBNP in the last 8760 hours.   D-Dimer No results for input(s): DDIMER in the last 72 hours. Hemoglobin A1C No results for input(s): HGBA1C in the last 72 hours. Fasting Lipid Panel No results for input(s): CHOL, HDL, LDLCALC, TRIG, CHOLHDL, LDLDIRECT in the last 72 hours. Thyroid Function Tests No results for input(s): TSH, T4TOTAL, T3FREE, THYROIDAB in the last 72 hours.  Invalid input(s): FREET3  Other results:   Imaging    Dg Chest Port 1 View  Result Date: 11/16/2017 CLINICAL DATA:  Patient status post CABG 11/13/2017. Chest tube removal. EXAM: PORTABLE CHEST 1 VIEW COMPARISON:  Single-view of the chest 11/15/2017 and 11/14/2016. FINDINGS: Left chest tube has been removed. Right IJ sheath remains in place. Trace left pleural effusion is noted. No pneumothorax. Lungs are clear. Heart size is upper normal. Multiple metallic fragments projecting over the left chest  are noted. IMPRESSION: Negative for pneumothorax after removal of left chest tube. Trace left pleural effusion. Electronically Signed   By: Inge Rise M.D.   On: 11/16/2017 08:43     Medications:     Scheduled Medications: . amiodarone  200 mg Oral Q12H   Followed by  . [START ON 11/22/2017] amiodarone  200 mg Oral Daily  . aspirin EC  81 mg Oral Daily  . atorvastatin  80 mg Oral q1800  . clopidogrel  75 mg Oral Daily  . docusate sodium  200 mg Oral Daily  . enoxaparin (LOVENOX) injection  40 mg Subcutaneous QHS  . finasteride  5 mg Oral Daily  . insulin aspart  0-24 Units Subcutaneous TID AC & HS  . metoprolol tartrate  12.5 mg Oral BID  . pantoprazole  40 mg Oral QAC breakfast  . sodium chloride flush  3 mL Intravenous Q12H  . tamsulosin  0.4 mg Oral QHS    Infusions: . sodium chloride      PRN Medications: sodium chloride, acetaminophen, bisacodyl **OR** bisacodyl, ondansetron **OR** ondansetron (ZOFRAN) IV, oxyCODONE, sodium chloride flush, traMADol    Patient Profile   Edwin Morris is a 72 y.o. male S/P vfib cardiac arrest with STEMI. Cath showed multivessel ASCAD with 60% ostial LAD, complex trifurcation lesion at prox LAD/SP/D1 of 85-90 and 95% and diffuse 85% in mid LAD, 70% D1, 60-80% ostial LCx, 40-50% OM in co-dominant LCx and 90% distal RCA and 60-70% mid RCA with EF 35%.   S/p  CABG 11/13/17.  Assessment/Plan   1.Cardiac arrest [facility]) in setting of STEMI, as below.  -S/p Vfib arrest - Continue amio 200 mg twice a day.   2. CAD s/p STEMI   - LHC 11/08/17 with 60% ostial LAD, complex trifurcation lesion at prox LAD/SP/D1 of 85-90 and 95% and diffuse 85% in mid LAD, 70% D1, 60-80% ostial LCx, 40-50% OM in co-dominant LCx and 90% distal RCA and 60-70% mid RCA with EF 35%. S/P CABG 11/13/17 -- Continue ASA and statin. Switch to low dose carvedilol.   3. Acute systolic CHF, ischemic CMP - Echo 11/10/17 40-45%, Grade 1 DD, Trivial AS, Trivial MR,  Mod LAE,  Trivial TR, PA peak pressure 25 mm Hg.  - TEE 11/13/2017 EF 40-45%  - Volume status stable.  - Add 12.5 mg spiro daily.  -Stop lopressor.  Switch to carvedilol 3.125 mg twice a day.   4.Acute respiratory failure ([place]) - Extubated 11/09/17. Stable.   5.Hyperlipidemia LDL goal <70 - LDL 91  -On high dose statin - Hgb A1C 5.6.  6. Hypokalemia - Resolved.   7. H/o Non-Hodgkins lymphoma 2009 - treated with chemo. No XRT - No change.   Medication concerns reviewed with patient and pharmacy team. Barriers identified: None at this time.   Length of Stay: 8  Amy Clegg, NP  11/16/2017, 10:12 AM  Advanced Heart Failure Team Pager [phone] (M-F; [place])  Please contact [facility] for night-coverage after hours (4p -7a ) and weekends on [email or web address]  Patient seen and examined with Darrick Grinder, NP. We discussed all aspects of the encounter. I agree with the assessment and plan as stated above.   Doing very well s/p CABG. Volume status looks ok. Rhythm stable. Agree with spiro and carvedilol. Consider losartan soon.   Hopefully home 24-48 hours.   Glori Bickers, MD  12:11 PM

## 2017-11-16 NOTE — Progress Notes (Signed)
TCTS BRIEF SICU PROGRESS NOTE  3 Days Post-Op  S/P Procedure(s) (LRB): CORONARY ARTERY BYPASS GRAFTING (CABG) times 5 using left internal mammary artery and right saphenous vein. (LIMA to LAD, SVG to DIAGONAL, SVG SEQUENTIALLY to DISTAL CIRCUMFLEX and OM1, SVG to RCA) (N/A) TRANSESOPHAGEAL ECHOCARDIOGRAM (TEE) (N/A)   Stable day  Plan: Awaiting bed for transfer  Rexene Alberts, MD 11/16/2017 6:06 PM

## 2017-11-16 NOTE — Progress Notes (Signed)
Per Dr. Servando Snare, if patient is not transferred to 4E by 2000 hrs on 11/16/17, do not transfer patient tonight.

## 2017-11-16 NOTE — Progress Notes (Signed)
First attempt to call report. Floor unable to take report stating that the order still said step down. Order was modified to say step down or telemetry per MD. Order has been adjusted.

## 2017-11-16 NOTE — Progress Notes (Signed)
Patient ID: Edwin Morris, male   DOB: 07-Apr-1946, 72 y.o.   MRN: [id number] TCTS DAILY ICU PROGRESS NOTE                   [address]            [place] [address]          [phone]   3 Days Post-Op Procedure(s) (LRB): CORONARY ARTERY BYPASS GRAFTING (CABG) times 5 using left internal mammary artery and right saphenous vein. (LIMA to LAD, SVG to DIAGONAL, SVG SEQUENTIALLY to DISTAL CIRCUMFLEX and OM1, SVG to RCA) (N/A) TRANSESOPHAGEAL ECHOCARDIOGRAM (TEE) (N/A)  Total Length of Stay:  LOS: 8 days   Subjective: Feels well, walking in unit   Objective: Vital signs in last 24 hours: Temp:  [98.3 F (36.8 C)-99.1 F (37.3 C)] 98.5 F (36.9 C) (03/18 0300) Pulse Rate:  [77-103] 103 (03/17 1400) Cardiac Rhythm: Normal sinus rhythm (03/18 0400) Resp:  [14-28] 27 (03/18 0600) BP: (99-135)/(50-95) 116/68 (03/18 0500) SpO2:  [93 %-98 %] 97 % (03/18 0000) Weight:  [176 lb 3.2 oz (79.9 kg)] 176 lb 3.2 oz (79.9 kg) (03/18 0500)  Filed Weights   11/14/17 0600 11/15/17 0630 11/16/17 0500  Weight: 172 lb 9.9 oz (78.3 kg) 179 lb 3.7 oz (81.3 kg) 176 lb 3.2 oz (79.9 kg)    Weight change: -0.5 oz (-1.376 kg)   Hemodynamic parameters for last 24 hours:    Intake/Output from previous day: 03/17 0701 - 03/18 0700 In: 1353.6 [P.O.:1260; I.V.:93.6] Out: 1440 [Urine:1390; Chest Tube:50]  Intake/Output this shift: No intake/output data recorded.  Current Meds: Scheduled Meds: . acetaminophen  1,000 mg Oral Q6H   Or  . acetaminophen (TYLENOL) oral liquid 160 mg/5 mL  1,000 mg Per Tube Q6H  . amiodarone  200 mg Oral Q12H   Followed by  . [START ON 11/22/2017] amiodarone  200 mg Oral Daily  . aspirin EC  81 mg Oral Daily  . atorvastatin  80 mg Oral q1800  . bisacodyl  10 mg Oral Daily   Or  . bisacodyl  10 mg Rectal Daily  . clopidogrel  75 mg Oral Daily  . docusate sodium  200 mg Oral Daily  . enoxaparin (LOVENOX) injection  40 mg Subcutaneous QHS  .  insulin aspart  0-24 Units Subcutaneous Q4H  . metoprolol tartrate  12.5 mg Oral BID   Or  . metoprolol tartrate  12.5 mg Per Tube BID  . pantoprazole  40 mg Oral Daily  . sodium chloride flush  3 mL Intravenous Q12H  . tamsulosin  0.4 mg Oral QHS   Continuous Infusions: . sodium chloride Stopped (11/14/17 1045)  . sodium chloride Stopped (11/14/17 0800)  . sodium chloride Stopped (11/14/17 1100)  . amiodarone Stopped (11/15/17 0800)  . dexmedetomidine (PRECEDEX) IV infusion Stopped (11/13/17 1717)  . insulin (NOVOLIN-R) infusion Stopped (11/14/17 1249)  . lactated ringers    . lactated ringers Stopped (11/15/17 0800)  . lactated ringers Stopped (11/14/17 0800)  . nitroGLYCERIN Stopped (11/13/17 1415)  . norepinephrine (LEVOPHED) Adult infusion Stopped (11/13/17 2201)  . phenylephrine (NEO-SYNEPHRINE) Adult infusion Stopped (11/13/17 1400)   PRN Meds:.sodium chloride, lactated ringers, metoprolol tartrate, midazolam, morphine injection, ondansetron (ZOFRAN) IV, oxyCODONE, potassium chloride, sodium chloride flush, traMADol  General appearance: alert and cooperative Neurologic: intact Heart: regular rate and rhythm, S1, S2 normal, no murmur, click, rub or gallop Lungs: clear to auscultation bilaterally Abdomen: soft, non-tender; bowel sounds normal; no  masses,  no organomegaly Extremities: extremities normal, atraumatic, no cyanosis or edema and Homans sign is negative, no sign of DVT Wound: sternum stable  Lab Results: CBC: Recent Labs    11/15/17 0420 11/16/17 0339  WBC 9.5 7.7  HGB 9.9* 9.8*  HCT 29.9* 28.8*  PLT 118* 130*   BMET:  Recent Labs    11/15/17 0420 11/16/17 0339  NA 134* 134*  K 3.8 3.7  CL 101 104  CO2 26 25  GLUCOSE 135* 113*  BUN 13 12  CREATININE 0.63 0.66  CALCIUM 7.8* 7.9*    CMET: Lab Results  Component Value Date   WBC 7.7 11/16/2017   HGB 9.8 (L) 11/16/2017   HCT 28.8 (L) 11/16/2017   PLT 130 (L) 11/16/2017   GLUCOSE 113 (H)  11/16/2017   CHOL 146 11/10/2017   TRIG 75 11/10/2017   HDL 40 (L) 11/10/2017   LDLCALC 91 11/10/2017   ALT 42 11/15/2017   AST 32 11/15/2017   NA 134 (L) 11/16/2017   K 3.7 11/16/2017   CL 104 11/16/2017   CREATININE 0.66 11/16/2017   BUN 12 11/16/2017   CO2 25 11/16/2017   INR 1.20 11/12/2017   INR 1.20 11/12/2017   HGBA1C 5.6 11/11/2017      PT/INR: No results for input(s): LABPROT, INR in the last 72 hours. Radiology: No results found.   Assessment/Plan: S/P Procedure(s) (LRB): CORONARY ARTERY BYPASS GRAFTING (CABG) times 5 using left internal mammary artery and right saphenous vein. (LIMA to LAD, SVG to DIAGONAL, SVG SEQUENTIALLY to DISTAL CIRCUMFLEX and OM1, SVG to RCA) (N/A) TRANSESOPHAGEAL ECHOCARDIOGRAM (TEE) (N/A) Mobilize Plan for transfer to step-down: see transfer orders Expected Acute  Blood - loss Anemia- continue to monitor     Grace Isaac 11/16/2017 7:43 AM

## 2017-11-17 ENCOUNTER — Inpatient Hospital Stay (HOSPITAL_COMMUNITY): Payer: PPO

## 2017-11-17 LAB — BASIC METABOLIC PANEL
Anion gap: 9 (ref 5–15)
BUN: 11 mg/dL (ref 6–20)
CO2: 23 mmol/L (ref 22–32)
Calcium: 8.3 mg/dL — ABNORMAL LOW (ref 8.9–10.3)
Chloride: 103 mmol/L (ref 101–111)
Creatinine, Ser: 0.69 mg/dL (ref 0.61–1.24)
GFR calc Af Amer: >60 mL/min (ref >60)
GFR calc non Af Amer: >60 mL/min (ref >60)
Glucose, Bld: 115 mg/dL — ABNORMAL HIGH (ref 65–99)
Potassium: 3.9 mmol/L (ref 3.5–5.1)
Sodium: 135 mmol/L (ref 135–145)

## 2017-11-17 LAB — CBC
HCT: 31.2 % — ABNORMAL LOW (ref 39.0–52.0)
Hemoglobin: 10.4 g/dL — ABNORMAL LOW (ref 13.0–17.0)
MCH: 29.3 pg (ref 26.0–34.0)
MCHC: 33.3 g/dL (ref 30.0–36.0)
MCV: 87.9 fL (ref 78.0–100.0)
Platelets: 187 10*3/uL (ref 150–400)
RBC: 3.55 MIL/uL — ABNORMAL LOW (ref 4.22–5.81)
RDW: 13.7 % (ref 11.5–15.5)
WBC: 7.6 10*3/uL (ref 4.0–10.5)

## 2017-11-17 MED ORDER — AMIODARONE HCL 200 MG PO TABS: 200.0000 mg | ORAL_TABLET | Freq: Every day | ORAL | 1 refills | Status: DC

## 2017-11-17 MED ORDER — CARVEDILOL 3.125 MG PO TABS: 3.1250 mg | ORAL_TABLET | Freq: Two times a day (BID) | ORAL | 1 refills | Status: DC

## 2017-11-17 MED ORDER — TRAMADOL HCL 50 MG PO TABS: 50.0000 mg | ORAL_TABLET | ORAL | 0 refills | Status: DC | PRN

## 2017-11-17 MED ORDER — SPIRONOLACTONE 25 MG PO TABS: 12.5000 mg | ORAL_TABLET | Freq: Every day | ORAL | 0 refills | Status: DC

## 2017-11-17 MED ORDER — AMIODARONE HCL 200 MG PO TABS: 200.0000 mg | ORAL_TABLET | Freq: Two times a day (BID) | ORAL | 1 refills | Status: DC

## 2017-11-17 MED ORDER — ASPIRIN 81 MG PO TBEC: 81.0000 mg | DELAYED_RELEASE_TABLET | Freq: Every day | ORAL | 0 refills | Status: DC

## 2017-11-17 MED ORDER — CLOPIDOGREL BISULFATE 75 MG PO TABS: 75.0000 mg | ORAL_TABLET | Freq: Every day | ORAL | 1 refills | Status: DC

## 2017-11-17 MED ORDER — ATORVASTATIN CALCIUM 80 MG PO TABS: 80.0000 mg | ORAL_TABLET | Freq: Every day | ORAL | 1 refills | Status: DC

## 2017-11-17 NOTE — Care Management Note (Signed)
Case Management Note Marvetta Gibbons RN, BSN Unit 4E-Case Manager [phone]  Patient Details  Name: Marcio Renato Spellman MRN: [id number] Date of Birth: [birth year]  Subjective/Objective:  Pt admitted with cardiac arrest/STEMI,  Cath with MVD s/p CABG on 0/53/97- acute systolic CHF              Action/Plan: PTA Pt lived at home with wife who is a Marine scientist, plan to return home with wife. Per discussion in QC mtg on 11/17/17- pt appropriate for [facility] program for high risk CABG/vol management per Dr. Reynaldo Minium. - Spoke with pt and wife at bedside- per pt he is ambulating independently- no needs for RW or any DME- discussed possible HHRN f/u- pt and wife agreeable if Dr. Servando Snare feels like it is necessary. - received referral notice per Dr. Servando Snare to arrange [facility] per [facility]- verbal order placed and notified Butch Penny with [facility] for [facility] needs under [place].   Expected Discharge Date:  11/18/17               Expected Discharge Plan:  [place]  In-House Referral:     Discharge planning Services  CM Consult  Post Acute Care Choice:  Home Health Choice offered to:  Patient, Spouse  DME Arranged:    DME Agency:     HH Arranged:  RN, Disease Management [place] Agency:  [place]  Status of Service:  Completed, signed off  If discussed at [place] of Stay Meetings, dates discussed:    Discharge Disposition: home/home health   Additional Comments:  Dawayne Patricia, RN 11/17/2017, 4:43 PM

## 2017-11-17 NOTE — Progress Notes (Addendum)
      [address]       [place], [address]             [phone]      4 Days Post-Op Procedure(s) (LRB): CORONARY ARTERY BYPASS GRAFTING (CABG) times 5 using left internal mammary artery and right saphenous vein. (LIMA to LAD, SVG to DIAGONAL, SVG SEQUENTIALLY to DISTAL CIRCUMFLEX and OM1, SVG to RCA) (N/A) TRANSESOPHAGEAL ECHOCARDIOGRAM (TEE) (N/A)   Subjective:  No new complaints.  Feels well and wants to go home today  Objective: Vital signs in last 24 hours: Temp:  [98.2 F (36.8 C)-99.9 F (37.7 C)] 98.7 F (37.1 C) (03/19 0457) Pulse Rate:  [85-92] 90 (03/19 0457) Cardiac Rhythm: Sinus tachycardia (03/19 0700) Resp:  [17-31] 20 (03/19 0457) BP: (102-136)/(60-79) 125/77 (03/19 0457) SpO2:  [91 %-100 %] 91 % (03/19 0457) Weight:  [172 lb 9.6 oz (78.3 kg)] 172 lb 9.6 oz (78.3 kg) (03/19 0457)  Intake/Output from previous day: 03/18 0701 - 03/19 0700 In: 720 [P.O.:720] Out: 950 [Urine:950]  General appearance: alert, cooperative and no distress Heart: regular rate and rhythm Lungs: clear to auscultation bilaterally Abdomen: soft, non-tender; bowel sounds normal; no masses,  no organomegaly Extremities: edema trace Wound: clean and dry  Lab Results: Recent Labs    11/16/17 0339 11/17/17 0228  WBC 7.7 7.6  HGB 9.8* 10.4*  HCT 28.8* 31.2*  PLT 130* 187   BMET:  Recent Labs    11/16/17 0339 11/17/17 0228  NA 134* 135  K 3.7 3.9  CL 104 103  CO2 25 23  GLUCOSE 113* 115*  BUN 12 11  CREATININE 0.66 0.69  CALCIUM 7.9* 8.3*    PT/INR: No results for input(s): LABPROT, INR in the last 72 hours. ABG    Component Value Date/Time   PHART 7.436 11/13/2017 1845   HCO3 23.6 11/13/2017 1845   TCO2 23 11/14/2017 1707   ACIDBASEDEF 1.0 11/13/2017 1731   O2SAT 65.4 11/15/2017 0430   CBG (last 3)  Recent Labs    11/16/17 0803 11/16/17 1206 11/16/17 1546  GLUCAP 119* 136* 117*    Assessment/Plan: S/P Procedure(s) (LRB): CORONARY  ARTERY BYPASS GRAFTING (CABG) times 5 using left internal mammary artery and right saphenous vein. (LIMA to LAD, SVG to DIAGONAL, SVG SEQUENTIALLY to DISTAL CIRCUMFLEX and OM1, SVG to RCA) (N/A) TRANSESOPHAGEAL ECHOCARDIOGRAM (TEE) (N/A)  1. CV- PAF, maintaining NSR, BP controlled- continue Coreg, Amiodarone 2. Pulm- no acute issues, continue IS 3. Renal- creatinine WNL, weight is trending down, continue Spironolactone 4. Expected post operative loss anemia,  Mild Hgb 10.4 5. Dispo- patient stable, will d/c EPW today, if remains stable will be ready for d/c later today vs tomorrow AM   LOS: 9 days    Ellwood Handler 11/17/2017  Pacing wires out today this am, Plan d/c this evening   Placed order for [facility] I have seen and examined Edwin Morris and agree with the above assessment  and plan.  Grace Isaac MD [place] [phone] Office [phone] 11/17/2017 4:15 PM

## 2017-11-17 NOTE — Care Management Important Message (Signed)
Important Message  Patient Details  Name: Edwin Morris MRN: [id number] Date of Birth: [birth year]   Medicare Important Message Given:  Yes    Kathryne Ramella Montine Circle 11/17/2017, 3:33 PM

## 2017-11-17 NOTE — Progress Notes (Signed)
CARDIAC REHAB PHASE I   PRE:  Rate/Rhythm: 87 SR  BP:  Supine: 123/75  Sitting:   Standing:    SaO2: 95%RA  MODE:  Ambulation: 470 ft   POST:  Rate/Rhythm: 109 ST  BP:  Supine: 137/81  Sitting:   Standing:    SaO2: 99%RA 1049-1122 Pt walked 470 ft on RA with steady gait. Tolerated well. Education competed with pt and wife who voiced understanding. Reviewed sternal precautions, staying in the tube, ex ed and gave heart healthy diet. Encouraged IS.  Referring to [place] Phase 2.    Graylon Good, RN BSN  11/17/2017 11:19 AM

## 2017-11-17 NOTE — Discharge Instructions (Signed)

## 2017-11-17 NOTE — Discharge Summary (Signed)
Physician Discharge Summary       [address]       [place] [address]             [phone]    Patient ID: Edwin Morris MRN: [id number] DOB/AGE: 1946-07-03 72 y.o.  Admit date: 11/08/2017 Discharge date: 11/17/2017  Admission Diagnoses: 1. VF (ventricular fibrillation) ([place]) 2.ST elevation myocardial infarction (STEMI) ([place])  3. Ischemic cardiomyopathy 4. Coronary artery disease  Discharge Diagnoses:  1. S/P CABG x 5 2. ABL anemia 3. Post op a fib 4. CHF 5. History of hyperlipidemia 6. History of BPH  Consults: Heart failure  Procedure (s):  LEFT HEART CATH AND CORONARY ANGIOGRAPHY by Dr. Claiborne Billings on 11/08/2017:  Conclusion     Prox LAD lesion is 85% stenosed.  Ost 1st Diag lesion is 85% stenosed.  Ost 1st Sept lesion is 95% stenosed.  Mid LAD lesion is 85% stenosed.  Ost 1st Diag to 1st Diag lesion is 70% stenosed.  Dist LM to Ost LAD lesion is 60% stenosed.  Ost Cx lesion is 65% stenosed.  Prox Cx lesion is 50% stenosed.  Mid Cx lesion is 50% stenosed.  Mid RCA lesion is 60% stenosed.  Prox RCA to Mid RCA lesion is 70% stenosed.  Dist RCA lesion is 90% stenosed.  LV end diastolic pressure is normal.  There is moderate to severe left ventricular systolic dysfunction.   Acute coronary syndrome/STEMI complicated by VF cardiac arrest during EMS transfer to [facility] hospital, treated probably with CPR/multiple defibrillations with restoration of sinus rhythm.  Significant cardiac calcification and multivessel CAD with 60% ostial LAD stenosis, complex trifurcation stenosis of the proximal LAD, diagonal and septal perforating artery of 85-90, 95%, followed by diffuse 85% stenosis in the mid LAD after a sharp angle in the vessel with 70% mid diagonal stenosis; 60-70% ostial stenosis of the circumflex vessel followed by 40-50% proximal stenosis and 40-50% distal stenosis after distal marginal branch in a co-dominant circumflex system; and  diffusely diseased mid RCA with narrowing of 60-70% with 90% distal RCA stenosis.  Moderately severe LV function with an EF of 35% with hypocontractility and distal anterolateral wall and inferoapical to distal inferior hypokinesis.  RECOMMENDATION: There is TIMI-3 flow presently in all vessels.  With significant coronary calcification and complex anatomy of the LAD with multivessel CAD, including ostial disease of the LAD and circumflex, will continue Aggrastat for at least 18 hours and obtain surgical consultation in a.m. for consideration of CABG revascularization surgery.  Edwin Morris ask angiograms to  be reviewed by colleagues.  Heparin will be restarted.  The patient was started on IV amiodarone.  During the procedure and will continue with infusion.  High potency statin therapy.    TRANSESOPHAGEAL ECHOCARDIOGRAM (TEE) MEDIAN STERNOTOMY for  CORONARY ARTERY BYPASS GRAFTING (CABG) x 5 (LIMA to LAD, SVG to DIAGONAL, SVG SEQUENTIALLY to DISTAL CIRCUMFLEX and OM1, SVG to RCA)   using left internal mammary artery and right greater saphenous vein by Dr. Servando Snare on 11/13/2017.   History of Presenting Illness: Edwin Morris is a 72 yo white male brought to the ED via EMS, yesterday.    The patient was raking leaves at which time he developed chest pain.  He went into the house and pain persisted.  EMS was contacted and during transportation patient developed a V. Fib arrest.  CPR was performed with defibrillation with restoration of NSR.  EKG in the ED showed ST elevation in aVF, V3, and V4 with RBBB.  He was given a bolus of Heparin.  He was intubated in the ED and taken emergently the catheterization lab and was found to have a reduced EF and severe CAD. Surgery consult requested today .  His wife is at bedside and provided history, patient still on vent  She states he smoked briefly in [place].  He has a history of Agent Orange exposure with development of lymphoma requiring treatment.  He has shrapnel on  his backside, but none involving his legs.  He has a history of BPH on finasteride and Hyperlipidemia on Lipitor.  He has no other medical problems and family history is negative for premature CAD.  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  Dr. Servando Snare discussed the need for coronary artery bypass grafting surgery. Potential risks, benefits, and complications of the surgery were discussed with the patient and he agreed to proceed with surgery. Pre operative carotid duplex US showed no significant internal carotid artery stenosis bilaterally. He underwent a CABG x 5 on 11/13/2017.  Brief Hospital Course:  The patient was extubated the evening of surgery without difficulty. He remained afebrile and hemodynamically stable. He was weaned off of Dopamine, Nor epinephrine, and Milrinone drips. Edwin Morris, a line, chest tubes, and foley were removed early in the post operative course. Coreg was started and titrated accordingly. He went into a fib. He was put on Amiodarone drip. He converted to sinus rhythm and was transitioned to oral Amiodarone. He was volume over loaded and diuresed. He had ABL anemia. He did not require a post op transfusion. Last H and H was stable at 10.4 and 31.2. He was weaned off the insulin drip.   The patient's glucose remained well controlled.The patient's HGA1C pre op was 5.6. The patient was felt surgically stable for transfer from the ICU to [facility] for further convalescence on 11/16/2017. He continues to progress with cardiac rehab. He was ambulating on room air. He/she has been tolerating a diet and has had a bowel movement. Epicardial pacing wires were removed on 11/17/2017. Chest tube sutures will be removed the day of discharge. The patient is felt surgically stable for discharge today.    Latest Vital Signs: Blood pressure 135/82, pulse 82, temperature 98.6 F (37 C), temperature source Oral, resp. rate (!) 24, height 6' (1.829  m), weight 172 lb 9.6 oz (78.3 kg), SpO2 93 %.  Physical Exam: General appearance: alert, cooperative and no distress Heart: regular rate and rhythm Lungs: clear to auscultation bilaterally Abdomen: soft, non-tender; bowel sounds normal; no masses,  no organomegaly Extremities: edema trace Wound: clean and dry    Discharge Condition:Stable and discharged to home.  Recent laboratory studies:  Lab Results  Component Value Date   WBC 7.6 11/17/2017   HGB 10.4 (L) 11/17/2017   HCT 31.2 (L) 11/17/2017   MCV 87.9 11/17/2017   PLT 187 11/17/2017   Lab Results  Component Value Date   NA 135 11/17/2017   K 3.9 11/17/2017   CL 103 11/17/2017   CO2 23 11/17/2017   CREATININE 0.69 11/17/2017   GLUCOSE 115 (H) 11/17/2017    Diagnostic Studies: Dg Chest 2 View  Result Date: 11/17/2017 CLINICAL DATA:  Status post CABG 4 days ago. EXAM: CHEST - 2 VIEW COMPARISON:  Portable chest x-ray of November 16, 2017. FINDINGS: The lungs are well-expanded. There is persistent subsegmental atelectasis in the right mid to lower lung and in the left lateral costophrenic angle.  Small amounts of pleural fluid blunt the posterior costophrenic angles. The heart is normal in size. The pulmonary vascularity is not engorged. There is calcification in the wall of the aortic arch. The sternal wires are intact. Small amounts of air are present in the retrosternal soft tissues. No frank pneumothorax is observed. IMPRESSION: Persistent subsegmental atelectasis bilaterally. Small bilateral pleural effusions layering posteriorly. No pulmonary edema. Small amounts of retrosternal air are present without evidence of a pneumothorax. Electronically Signed   By: David  Martinique Discharge Instructions    Amb Referral to Cardiac Rehabilitation   Complete by:  As directed    Diagnosis:   CABG STEMI     CABG X ___:  5      Discharge Medications: Allergies as of 11/17/2017   No Known Allergies     Medication List    STOP  taking these medications   naproxen sodium 220 MG tablet Commonly known as:  ALEVE   vitamin E 200 UNIT capsule Generic drug:  vitamin E     TAKE these medications   amiodarone 200 MG tablet Commonly known as:  PACERONE Take 1 tablet (200 mg total) by mouth every 12 (twelve) hours.   amiodarone 200 MG tablet Commonly known as:  PACERONE Take 1 tablet (200 mg total) by mouth daily. Start taking on:  11/22/2017   aspirin 81 MG EC tablet Take 1 tablet (81 mg total) by mouth daily. Start taking on:  11/18/2017   atorvastatin 80 MG tablet Commonly known as:  LIPITOR Take 1 tablet (80 mg total) by mouth daily at 6 PM. Start taking on:  11/18/2017 What changed:    medication strength  how much to take  when to take this   carvedilol 3.125 MG tablet Commonly known as:  COREG Take 1 tablet (3.125 mg total) by mouth 2 (two) times daily with a meal. Start taking on:  11/18/2017   clopidogrel 75 MG tablet Commonly known as:  PLAVIX Take 1 tablet (75 mg total) by mouth daily. Start taking on:  11/18/2017   finasteride 5 MG tablet Commonly known as:  PROSCAR Take 5 mg by mouth daily.   spironolactone 25 MG tablet Commonly known as:  ALDACTONE Take 0.5 tablets (12.5 mg total) by mouth daily. Start taking on:  11/18/2017   traMADol 50 MG tablet Commonly known as:  ULTRAM Take 1-2 tablets (50-100 mg total) by mouth every 4 (four) hours as needed for moderate pain.      The patient has been discharged on:   1.Beta Blocker:  Yes [ x  ]                              No   [   ]                              If No, reason:  2.Ace Inhibitor/ARB: Yes [   ]                                     No  [ x   ]                                     If No,  reason:  3.Statin:   Yes [ x  ]                  No  [   ]                  If No, reason:  4.Ecasa:  Yes  [ x  ]                  No   [   ]                  If No, reason:  Follow Up Appointments: Follow-up Information     Grace Isaac, MD. Go on 12/14/2017.   Specialty:  Cardiothoracic Surgery Why:  PA/LAT CXR to be taken (at [facility] which is in the same building as Dr. Everrett Coombe office) on 12/14/2017 atb 1:30 pm;Appointment time is at 2:00 pm Contact information: [place] [address] [phone]        [facility]. Go on 11/30/2017.   Specialty:  Cardiology Why:  9:00 AM, Advanced Heart Failure Clinic, parking code [id number] Contact information: [address] [id number] [place], [place] Follow up.   Specialty:  [place] Why:  [place] arranged- for post op monitoring- they will call you to set up visits Contact information: [place] [address] [phone]           Signed: Terance Hart ContePA-C 11/17/2017, 5:59 PM

## 2017-11-17 NOTE — Progress Notes (Addendum)
Advanced Heart Failure Rounding Note  PCP-Cardiologist: Shelva Majestic, MD   Subjective:    S/p CABG 11/13/17  Feeling great today. Wants to go home. No lightheadedness or chest pain. Denies SOB.   Creatinine 0.69 this am. K 3.9.  Objective:   Weight Range: 172 lb 9.6 oz (78.3 kg) Body mass index is 23.41 kg/m.   Vital Signs:   Temp:  [98.2 F (36.8 C)-99.9 F (37.7 C)] 98.7 F (37.1 C) (03/19 0457) Pulse Rate:  [85-92] 90 (03/19 0457) Resp:  [17-31] 20 (03/19 0457) BP: (102-136)/(60-79) 125/77 (03/19 0457) SpO2:  [91 %-100 %] 91 % (03/19 0457) Weight:  [172 lb 9.6 oz (78.3 kg)] 172 lb 9.6 oz (78.3 kg) (03/19 0457) Last BM Date: 11/15/17  Weight change: Filed Weights   11/15/17 0630 11/16/17 0500 11/17/17 0457  Weight: 179 lb 3.7 oz (81.3 kg) 176 lb 3.2 oz (79.9 kg) 172 lb 9.6 oz (78.3 kg)   Intake/Output:   Intake/Output Summary (Last 24 hours) at 11/17/2017 0828 Last data filed at 11/16/2017 1600 Gross per 24 hour  Intake 720 ml  Output 950 ml  Net -230 ml    Physical Exam    General: Well appearing. No resp difficulty. HEENT: Normal Neck: Supple. JVP 5-6. Carotids 2+ bilat; no bruits. No thyromegaly or nodule noted. Cor: PMI nondisplaced. RRR, No M/G/R noted. Sternal incision well healing.  Lungs: CTAB, normal effort. Abdomen: Soft, non-tender, non-distended, no HSM. No bruits or masses. +BS  Extremities: No cyanosis, clubbing, or rash. R and LLE no edema.  Neuro: Alert & orientedx3, cranial nerves grossly intact. moves all 4 extremities w/o difficulty. Affect pleasant    Telemetry   NSR 80-90s, personally reviewed.   EKG    No new tracings.    Labs    CBC Recent Labs    11/16/17 0339 11/17/17 0228  WBC 7.7 7.6  HGB 9.8* 10.4*  HCT 28.8* 31.2*  MCV 86.5 87.9  PLT 130* 007   Basic Metabolic Panel Recent Labs    11/14/17 1704  11/16/17 0339 11/17/17 0228  NA  --    < > 134* 135  K  --    < > 3.7 3.9  CL  --    < > 104 103  CO2   --    < > 25 23  GLUCOSE  --    < > 113* 115*  BUN  --    < > 12 11  CREATININE 0.63   < > 0.66 0.69  CALCIUM  --    < > 7.9* 8.3*  MG 1.8  --   --   --    < > = values in this interval not displayed.   Liver Function Tests Recent Labs    11/15/17 0420  AST 32  ALT 42  ALKPHOS 48  BILITOT 0.8  PROT 4.4*  ALBUMIN 2.6*   No results for input(s): LIPASE, AMYLASE in the last 72 hours. Cardiac Enzymes No results for input(s): CKTOTAL, CKMB, CKMBINDEX, TROPONINI in the last 72 hours.  BNP: BNP (last 3 results) No results for input(s): BNP in the last 8760 hours.  ProBNP (last 3 results) No results for input(s): PROBNP in the last 8760 hours.   D-Dimer No results for input(s): DDIMER in the last 72 hours. Hemoglobin A1C No results for input(s): HGBA1C in the last 72 hours. Fasting Lipid Panel No results for input(s): CHOL, HDL, LDLCALC, TRIG, CHOLHDL, LDLDIRECT in the last 72 hours. Thyroid  Function Tests No results for input(s): TSH, T4TOTAL, T3FREE, THYROIDAB in the last 72 hours.  Invalid input(s): FREET3  Other results:   Imaging    Dg Chest 2 View  Result Date: 11/17/2017 CLINICAL DATA:  Status post CABG 4 days ago. EXAM: CHEST - 2 VIEW COMPARISON:  Portable chest x-ray of November 16, 2017. FINDINGS: The lungs are well-expanded. There is persistent subsegmental atelectasis in the right mid to lower lung and in the left lateral costophrenic angle. Small amounts of pleural fluid blunt the posterior costophrenic angles. The heart is normal in size. The pulmonary vascularity is not engorged. There is calcification in the wall of the aortic arch. The sternal wires are intact. Small amounts of air are present in the retrosternal soft tissues. No frank pneumothorax is observed. IMPRESSION: Persistent subsegmental atelectasis bilaterally. Small bilateral pleural effusions layering posteriorly. No pulmonary edema. Small amounts of retrosternal air are present without evidence of  a pneumothorax. Electronically Signed   By: David  Martinique M.D.   On: 11/17/2017 07:55     Medications:     Scheduled Medications: . amiodarone  200 mg Oral Q12H   Followed by  . [START ON 11/22/2017] amiodarone  200 mg Oral Daily  . aspirin EC  81 mg Oral Daily  . atorvastatin  80 mg Oral q1800  . carvedilol  3.125 mg Oral BID WC  . clopidogrel  75 mg Oral Daily  . docusate sodium  200 mg Oral Daily  . enoxaparin (LOVENOX) injection  40 mg Subcutaneous QHS  . finasteride  5 mg Oral Daily  . pantoprazole  40 mg Oral QAC breakfast  . sodium chloride flush  3 mL Intravenous Q12H  . spironolactone  12.5 mg Oral Daily  . tamsulosin  0.4 mg Oral QHS    Infusions: . sodium chloride    . magnesium sulfate 1 - 4 g bolus IVPB      PRN Medications: sodium chloride, acetaminophen, bisacodyl **OR** bisacodyl, ondansetron **OR** ondansetron (ZOFRAN) IV, oxyCODONE, sodium chloride flush, traMADol    Patient Profile   Edwin Morris is a 72 y.o. male S/P vfib cardiac arrest with STEMI. Cath showed multivessel ASCAD with 60% ostial LAD, complex trifurcation lesion at prox LAD/SP/D1 of 85-90 and 95% and diffuse 85% in mid LAD, 70% D1, 60-80% ostial LCx, 40-50% OM in co-dominant LCx and 90% distal RCA and 60-70% mid RCA with EF 35%.   S/p  CABG 11/13/17.  Assessment/Plan   1.Cardiac arrest [facility]) in setting of STEMI, as below.  -S/p Vfib arrest - Continue amio 200 mg twice a day.   2. CAD s/p STEMI   - LHC 11/08/17 with 60% ostial LAD, complex trifurcation lesion at prox LAD/SP/D1 of 85-90 and 95% and diffuse 85% in mid LAD, 70% D1, 60-80% ostial LCx, 40-50% OM in co-dominant LCx and 90% distal RCA and 60-70% mid RCA with EF 35%. S/P CABG 11/13/17 -- Continue ASA and statin. Switch to low dose carvedilol.   3. Acute systolic CHF, ischemic CMP - Echo 11/10/17 40-45%, Grade 1 DD, Trivial AS, Trivial MR, Mod LAE, Trivial TR, PA peak pressure 25 mm Hg.  - TEE 11/13/2017 EF 40-45%  -  Volume status stable.  - Continue 12.5 mg spiro daily.  - Continue coreg 3.125 mg BID.  - Add losartan 25 mg qhs.   4.Acute respiratory failure ([place]) - Extubated 11/09/17. Resolved.   5.Hyperlipidemia LDL goal <70 - LDL 91  -On high dose statin - Hgb A1C 5.6. -  No change.   6. Hypokalemia - Resolved  7. H/o Non-Hodgkins lymphoma 2009 - treated with chemo. No XRT - No change.   HF meds for home Spiro 12.5 mg daily Coreg 3.125 mg BID Losartan 25 mg daily  Amio 200 mg BID, then start Amio 200 mg daily on 11/22/17  Medication concerns reviewed with patient and pharmacy team. Barriers identified: None at this time.   Length of Stay: [place], [place]  11/17/2017, 8:28 AM  Advanced Heart Failure Team Pager [phone] (M-F; 7a - 4p)  Please contact [facility] for night-coverage after hours (4p -7a ) and weekends on [email or web address]  Patient seen and examined with the above-signed Advanced Practice Provider and/or Housestaff. I personally reviewed laboratory data, imaging studies and relevant notes. I independently examined the patient and formulated the important aspects of the plan. I have edited the note to reflect any of my changes or salient points. I have personally discussed the plan with the patient and/or family.  He is doing very well. Volume status looks good. Rhythm stable. Agree with d/c home today. F/u CR and HF Clinic.   Glori Bickers, MD  10:49 AM

## 2017-11-17 NOTE — Progress Notes (Signed)
11/17/2017 8:56 AM EPW D/C'd per order and per protocol.  Ends intact. Pt. Tolerated well.  Advised bedrest x1hr.  Call bell in reach.  Vital signs collected per protocol. [place]

## 2017-11-17 NOTE — Progress Notes (Signed)
11/17/2017 7:19 PM Discharge AVS meds taken today and those due this evening reviewed.  Follow-up appointments and when to call md reviewed.  D/C IV and TELE.  Questions and concerns addressed.   D/C home per orders. [place]

## 2017-11-18 MED FILL — Heparin Sodium (Porcine) Inj 1000 Unit/ML: INTRAMUSCULAR | Qty: 30 | Status: AC

## 2017-11-18 MED FILL — Electrolyte-R (PH 7.4) Solution: INTRAVENOUS | Qty: 4000 | Status: AC

## 2017-11-18 MED FILL — Sodium Chloride IV Soln 0.9%: INTRAVENOUS | Qty: 2000 | Status: AC

## 2017-11-18 MED FILL — Mannitol IV Soln 20%: INTRAVENOUS | Qty: 1000 | Status: AC

## 2017-11-18 MED FILL — Lidocaine HCl IV Inj 20 MG/ML: INTRAVENOUS | Qty: 5 | Status: AC

## 2017-11-18 MED FILL — Cefuroxime Sodium For Inj 750 MG: INTRAMUSCULAR | Qty: 750 | Status: AC

## 2017-11-18 MED FILL — Potassium Chloride Inj 2 mEq/ML: INTRAVENOUS | Qty: 40 | Status: AC

## 2017-11-18 MED FILL — Sodium Bicarbonate IV Soln 8.4%: INTRAVENOUS | Qty: 50 | Status: AC

## 2017-11-18 MED FILL — Calcium Chloride Inj 10%: INTRAVENOUS | Qty: 10 | Status: AC

## 2017-11-18 MED FILL — Magnesium Sulfate Inj 50%: INTRAMUSCULAR | Qty: 10 | Status: AC

## 2017-11-18 NOTE — Op Note (Signed)
NAMECOURTNEY, FENLON                ACCOUNT NO.:  [id number]  MEDICAL RECORD NO.:  [id number]  LOCATION:  2T55D                        FACILITY:  [facility]  PHYSICIAN:  Lanelle Bal, MD    DATE OF BIRTH:  [birth year]  DATE OF PROCEDURE:  11/13/2017 DATE OF DISCHARGE:                              OPERATIVE REPORT   PREOPERATIVE DIAGNOSES:  Recent hospital cardiac arrest with severe 3- vessel coronary artery disease.  POSTOPERATIVE DIAGNOSES:  Recent hospital cardiac arrest with severe 3- vessel coronary artery disease.  SURGICAL PROCEDURES PERFORMED:  Coronary artery bypass grafting x5 with the left internal mammary to the left anterior descending coronary artery, reverse saphenous vein graft to the diagonal coronary artery, sequential reverse saphenous vein graft to the first obtuse marginal and distal circumflex, and reverse saphenous vein graft to the distal right coronary artery with right greater saphenous calf endo vein harvesting.  SURGEON:  Lanelle Bal, MD.  FIRST ASSISTANT:  Lars Pinks, PA.  BRIEF HISTORY:  The patient is a 72 year old male, who was raking gumballs from his front yard.  He began having severe chest pain radiating into both arms.  He ultimately called EMS and had a cardiac arrest prior to arrival in hospital.  CPR and multiple shocks were performed.  He was seen urgently by Dr. Claiborne Billings, who performed cardiac catheterization.  At this point, the patient had stabilized. Catheterization showed severe 3-vessel coronary artery disease, with greater than 80% stenosis of the LAD diagonal, and 80% stenosis of the circumflex and right coronary arteries.  Ejection fraction was depressed with anterior hypokinesis.  The patient had been intubated and sedated the day following cardiac catheterization.  Cardiac Surgery consultation was obtained.  The patient then remained hemodynamically stable, and was slowly weaned from the ventilator and maintained on  heparin drip. Neurologically, he was stable and was awake and neurologically intact. Over 48 hours, he was weaned and extubated, and his lungs had cleared. Coronary artery bypass grafting was recommended, and he agreed and signed informed consent.  DESCRIPTION OF PROCEDURE:  With Swan-Ganz and arterial line monitors in place, the patient underwent general endotracheal anesthesia without incident.  A TEE probe was placed, which showed depressed ejection fraction of approximately 40% with anterior apical hypokinesis.  Skin of the chest and legs were prepped with Betadine and draped in the usual sterile manner.  Appropriate time-out was performed, and we proceeded with harvesting of vein endoscopically from the right thigh and calf.  The vein was of good quality and caliber.  A median sternotomy was performed and the left internal mammary artery was dissected down as a pedicle graft.  The distal artery was divided and had good free flow.  The pericardium was opened, and overall ventricular function appeared preserved.  There was some hypokinesis anterior apically; however, there was no obvious full- thickness scar.  The patient was systemically heparinized and ascending aorta was cannulated.  The right atrium was cannulated.  An aortic root vent cardioplegia needle was introduced into the ascending aorta.  The patient was placed on cardiopulmonary bypass at 2.4 L/min/m2.  Sites of anastomosis were selected and dissected out of the epicardium.  The patient's body temperature was  cooled to 32 degrees.  An aortic crossclamp was applied, and 500 mL of cold blood potassium cardioplegia was administered with diastolic arrest of the heart.  Attention was turned first to the distal right coronary artery, which was opened and admitted a 1.5-mm probe.  Using a running 7-0 Prolene, distal anastomosis was performed.  The heart was then elevated and the first obtuse marginal vessel was opened and  admitted a 1.5-mm probe. Using a diamond-type side-to-side anastomosis was carried out with a running 7-0 Prolene.  Distal extent of the same vein was carried distal to the distal circumflex branch, which was opened and slightly smaller, but admitted a 1.5-mm probe.  Using a running 7-0 Prolene, the distal anastomosis was performed.  Additional cold blood cardioplegia was administered down the vein grafts.  Attention was then turned to the diagonal coronary artery, which was a relatively long vessel.  The vessel was opened and admitted a 1-mm probe distally.  Using a running 7-0 Prolene, distal anastomosis was performed with a segment of reverse saphenous vein graft.  The midportion of the LAD was then opened, and admitted a 1.5-mm probe distally and proximally.  Using a running 8-0 Prolene, the left internal mammary artery was anastomosed to the left anterior descending coronary artery. With the cross-clamp still in place, three punch aortotomies were performed, and each of the 3 vein grafts were anastomosed to the ascending aorta.  The bulldog was removed from the mammary artery with prompt rise in myocardial septal temperature.  The heart was allowed to passively fill and de-air, and the proximal anastomoses were completed. The cross-clamp was then removed.  The patient spontaneously converted to a sinus rhythm.  Sites of anastomosis were inspected and were free of bleeding.  He was started on low-dose milrinone and dopamine infusion. With the body temperature re-warmed to 37 degrees, he was then ventilated and weaned off cardiopulmonary bypass without difficulty.  He remained hemodynamically stable.  He was decannulated in usual fashion. Protamine sulfate was administered.  With the operative field hemostatic, a left pleural tube and a Blake mediastinal drain were left in place.  Total cross-clamp time 1 hour and 50 minutes total pump time 2 hours and 30 minutes  WOUND CLOSURE:  The  pericardium was loosely reapproximated.  The sternum was closed with #6 stainless steel wire, fascia was closed with interrupted 0 Vicryl, running 3-0 Vicryl for subcutaneous tissue, and 3- 0 subcuticular stitch in the skin edges.  Dry dressings were applied.  COUNT RESULTS:  Sponge and needle counts were reported as correct at completion of the procedure.  CONDITION:  The patient tolerated the procedure without obvious complication, and was transferred to Surgical Intensive Care Unit for further postoperative care.     Lanelle Bal, MD     EG/MEDQ  D:  11/18/2017  T:  11/18/2017  Job:  [id number]

## 2017-11-19 ENCOUNTER — Encounter: Payer: Self-pay | Admitting: Internal Medicine

## 2017-11-19 ENCOUNTER — Telehealth: Payer: Self-pay

## 2017-11-19 NOTE — Telephone Encounter (Signed)
-----   Message from Laury Deep, RN sent at 11/18/2017 11:46 AM EDT ----- Sounds great, thank you! ----- Message ----- From: Donnella Sham, RN Sent: 11/18/2017  11:22 AM To: Laury Deep, RN  I called, they have not picked up prescriptions yet.  Did see where Tramadol was sent to the [facility].  I told his wife to give me a call back once she has the prescriptions and let me know if they have it!!  I'll keep you and Manuela Schwartz updated in case I do not hear back from them before I leave for class.  Thanks Caryl Pina ----- Message ----- From: Laury Deep, RN Sent: 11/18/2017  10:20 AM To: Donnella Sham, RN  Hey,   EBG wants to make sure patient got his narcotics after he went home yesterday?  Can you call him at some point to double check please?  Thanks so much,  [employer]

## 2017-11-19 NOTE — Telephone Encounter (Signed)
Ms. Neely called back and stated that she had all the prescriptions needed, including pain medication.  No other questions or concerns.

## 2017-11-23 ENCOUNTER — Telehealth (HOSPITAL_COMMUNITY): Payer: Self-pay

## 2017-11-23 NOTE — Telephone Encounter (Signed)
Called patient in regards to Insurance and patient stated he is insured through [employer]. Patient is interested in the program but did state that he will not do the full 12 weeks of CR. Explained scheduling process to patient and patient verbally stated he understands. Once insurance is verified, patient will be called to give insurance information.

## 2017-11-23 NOTE — Telephone Encounter (Signed)
Patients insurance is active and benefits verified through [place] - $15.00 co-pay, no deductible, out of pocket amount of $3,400/$40.00 has been met, no co-insurance, and no pre-authorization is required. Spoke with [employer] - Reference [id number]  Will contact patient in regards to insurance. Once follow up appt has been completed, patient will be contacted for scheduling upon review by the RN Navigator.

## 2017-11-25 ENCOUNTER — Telehealth (HOSPITAL_COMMUNITY): Payer: Self-pay

## 2017-11-30 ENCOUNTER — Ambulatory Visit (HOSPITAL_COMMUNITY)
Admission: RE | Admit: 2017-11-30 | Discharge: 2017-11-30 | Disposition: A | Discharge status: Home / Self Care | Payer: PPO | Source: Ambulatory Visit | Attending: Internal Medicine | Admitting: Internal Medicine

## 2017-11-30 ENCOUNTER — Encounter (HOSPITAL_COMMUNITY): Payer: Self-pay

## 2017-11-30 ENCOUNTER — Encounter (HOSPITAL_COMMUNITY): Payer: PPO

## 2017-11-30 VITALS — BP 152/88 | HR 76 | Ht 72.0 in | Wt 163.2 lb

## 2017-11-30 DIAGNOSIS — C8333 Diffuse large B-cell lymphoma, intra-abdominal lymph nodes: Secondary | ICD-10-CM

## 2017-11-30 DIAGNOSIS — I2510 Atherosclerotic heart disease of native coronary artery without angina pectoris: Secondary | ICD-10-CM | POA: Diagnosis not present

## 2017-11-30 DIAGNOSIS — E785 Hyperlipidemia, unspecified: Secondary | ICD-10-CM | POA: Insufficient documentation

## 2017-11-30 DIAGNOSIS — E876 Hypokalemia: Secondary | ICD-10-CM | POA: Insufficient documentation

## 2017-11-30 DIAGNOSIS — Z7982 Long term (current) use of aspirin: Secondary | ICD-10-CM | POA: Diagnosis not present

## 2017-11-30 DIAGNOSIS — I4510 Unspecified right bundle-branch block: Secondary | ICD-10-CM | POA: Insufficient documentation

## 2017-11-30 DIAGNOSIS — I255 Ischemic cardiomyopathy: Secondary | ICD-10-CM

## 2017-11-30 DIAGNOSIS — Z8572 Personal history of non-Hodgkin lymphomas: Secondary | ICD-10-CM | POA: Diagnosis not present

## 2017-11-30 DIAGNOSIS — I5022 Chronic systolic (congestive) heart failure: Secondary | ICD-10-CM | POA: Diagnosis not present

## 2017-11-30 DIAGNOSIS — N400 Enlarged prostate without lower urinary tract symptoms: Secondary | ICD-10-CM | POA: Diagnosis not present

## 2017-11-30 DIAGNOSIS — Z951 Presence of aortocoronary bypass graft: Secondary | ICD-10-CM | POA: Diagnosis not present

## 2017-11-30 DIAGNOSIS — Z9221 Personal history of antineoplastic chemotherapy: Secondary | ICD-10-CM | POA: Insufficient documentation

## 2017-11-30 DIAGNOSIS — Z79899 Other long term (current) drug therapy: Secondary | ICD-10-CM | POA: Insufficient documentation

## 2017-11-30 LAB — CBC
HEMATOCRIT: 40.6 % (ref 39.0–52.0)
Hemoglobin: 13.1 g/dL (ref 13.0–17.0)
MCH: 28.6 pg (ref 26.0–34.0)
MCHC: 32.3 g/dL (ref 30.0–36.0)
MCV: 88.6 fL (ref 78.0–100.0)
Platelets: 425 10*3/uL — ABNORMAL HIGH (ref 150–400)
RBC: 4.58 MIL/uL (ref 4.22–5.81)
RDW: 13.8 % (ref 11.5–15.5)
WBC: 6.1 10*3/uL (ref 4.0–10.5)

## 2017-11-30 LAB — BASIC METABOLIC PANEL
Anion gap: 10 (ref 5–15)
BUN: 13 mg/dL (ref 6–20)
CO2: 21 mmol/L — ABNORMAL LOW (ref 22–32)
Calcium: 8.9 mg/dL (ref 8.9–10.3)
Chloride: 108 mmol/L (ref 101–111)
Creatinine, Ser: 0.71 mg/dL (ref 0.61–1.24)
GFR calc Af Amer: >60 mL/min (ref >60)
GLUCOSE: 101 mg/dL — AB (ref 65–99)
POTASSIUM: 4.5 mmol/L (ref 3.5–5.1)
Sodium: 139 mmol/L (ref 135–145)

## 2017-11-30 MED ORDER — LOSARTAN POTASSIUM 25 MG PO TABS: 25.0000 mg | ORAL_TABLET | Freq: Two times a day (BID) | ORAL | 11 refills | Status: DC

## 2017-11-30 NOTE — Progress Notes (Signed)
Advanced Heart Failure Clinic Note   Referring Physician: PCP: Bernerd Limbo, MD PCP-Cardiologist: Shelva Majestic, MD   HPI:  Edwin Morris is a 72 y.o. male chronic systolic CHF, ICM, CAD s/p CABG, HLD, NHL, and BPH.   Diagnosed with NHL in 2009 and treated with chemotherapy (no XRT) by Dr. Julien Nordmann .  PRIOR THERAPY:  1. Status post 6 cycle of systemic chemotherapy with CHOP/Rituxan with Neulasta support. Last dose of chemotherapy was given March 27, 2009 and the patient refused to continue with the last 2 cycles of his treatment. 2. Maintenance Rituxan 375 mg per meter square given every 2 months. He is status post 3 cycles, again discontinued in July 2011.  Admitted 11/08/17 with chest tightness and pressure. During transport via EMS had VF arrest requiring defibrillation and transient CPR. ECG with ST elevation in III, aVF, V3, and V4 with RBBB. Intubated for airway protection. Taken emergently to cath which showed severe multivessel disease. CVTS consulted 11/09/17 and taken for CABG 11/13/17. HF team followed for med titration as tolerated. He had a relatively uncomplicated post op course. CR consulted.   He presents today for post hospital follow up. Has been doing well overall.  Due to back log, doesn't start cardiac rehab until May.  He has been taking his medicines as directed, apart from losartan, which he does not have on his list at home. He denies SOB with walking ~20 minutes daily. Denies lightheadedness, dizziness, or CP. 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 He sees CVTS 12/14/17. He denies swelling, orthopnea or PND.  Review of systems complete and found to be negative unless listed in HPI.    Past Medical History:  Diagnosis Date  . BPH (benign prostatic hyperplasia)   . Hyperlipidemia   . NHL (non-Hodgkin's lymphoma) ([facility])    nhl dx 10/2008    Current Outpatient Medications  Medication Sig Dispense Refill  . amiodarone (PACERONE) 200 MG tablet Take 1 tablet  (200 mg total) by mouth every 12 (twelve) hours. 60 tablet 1  . amiodarone (PACERONE) 200 MG tablet Take 1 tablet (200 mg total) by mouth daily. 50 tablet 1  . aspirin EC 81 MG EC tablet Take 1 tablet (81 mg total) by mouth daily. 30 tablet 0  . atorvastatin (LIPITOR) 80 MG tablet Take 1 tablet (80 mg total) by mouth daily at 6 PM. 30 tablet 1  . carvedilol (COREG) 3.125 MG tablet Take 1 tablet (3.125 mg total) by mouth 2 (two) times daily with a meal. 60 tablet 1  . cholecalciferol (VITAMIN D) 1000 UNITS tablet Take 1,000 Units by mouth daily.    . clopidogrel (PLAVIX) 75 MG tablet Take 1 tablet (75 mg total) by mouth daily. 30 tablet 1  . co-enzyme Q-10 50 MG capsule Take 50 mg by mouth daily.    . Cyanocobalamin (RA VITAMIN B-12 TR) 1000 MCG TBCR Take 1,000 mcg by mouth daily.    . finasteride (PROSCAR) 5 MG tablet Take 5 mg by mouth daily.    . finasteride (PROSCAR) 5 MG tablet Take 5 mg by mouth daily.    . fish oil-omega-3 fatty acids 1000 MG capsule Take 2 g by mouth daily.    . pravastatin (PRAVACHOL) 40 MG tablet Take 40 mg by mouth daily.    Marland Kitchen spironolactone (ALDACTONE) 25 MG tablet Take 0.5 tablets (12.5 mg total) by mouth daily. 30 tablet 0  . traMADol (ULTRAM) 50 MG tablet Take 1-2 tablets (50-100 mg total)  by mouth every 4 (four) hours as needed for moderate pain. 30 tablet 0   No current facility-administered medications for this encounter.     No Known Allergies    Social History   Socioeconomic History  . Marital status: Married    Spouse name: Not on file  . Number of children: Not on file  . Years of education: Not on file  . Highest education level: Not on file  Occupational History  . Not on file  Social Needs  . Financial resource strain: Not on file  . Food insecurity:    Worry: Not on file    Inability: Not on file  . Transportation needs:    Medical: Not on file    Non-medical: Not on file  Tobacco Use  . Smoking status: Never Smoker  . Smokeless  tobacco: Never Used  Substance and Sexual Activity  . Alcohol use: Not on file  . Drug use: Not on file  . Sexual activity: Not on file  Lifestyle  . Physical activity:    Days per week: Not on file    Minutes per session: Not on file  . Stress: Not on file  Relationships  . Social connections:    Talks on phone: Not on file    Gets together: Not on file    Attends religious service: Not on file    Active member of club or organization: Not on file    Attends meetings of clubs or organizations: Not on file    Relationship status: Not on file  . Intimate partner violence:    Fear of current or ex partner: Not on file    Emotionally abused: Not on file    Physically abused: Not on file    Forced sexual activity: Not on file  Other Topics Concern  . Not on file  Social History Narrative   ** Merged History Encounter **       Family History  Problem Relation Age of Onset  . Heart disease Maternal Uncle   . Heart disease Paternal Uncle      Vitals:   11/30/17 0911  BP: (!) 152/88  Pulse: 76  SpO2: 98%  Weight: 163 lb 3.2 oz (74 kg)  Height: 6' (1.829 m)   Wt Readings from Last 3 Encounters:  11/30/17 163 lb 3.2 oz (74 kg)  11/17/17 172 lb 9.6 oz (78.3 kg)  06/18/15 169 lb 14.4 oz (77.1 kg)   PHYSICAL EXAM: General:  Well appearing. No respiratory difficulty HEENT: normal Neck: supple. no JVD. Carotids 2+ bilat; no bruits. No lymphadenopathy or thyromegaly appreciated. Cor: PMI nondisplaced. Regular rate & rhythm. No rubs, gallops or murmurs. Lungs: clear Abdomen: soft, nontender, nondistended. No hepatosplenomegaly. No bruits or masses. Good bowel sounds. Extremities: no cyanosis, clubbing, rash, edema Neuro: alert & oriented x 3, cranial nerves grossly intact. moves all 4 extremities w/o difficulty. Affect pleasant.  ECG: No new tracings.    ASSESSMENT & PLAN:  1. Chronic systolic CHF. ICM.  - Echo 11/10/17 40-45%, Grade 1 DD, Trivial AS, Trivial MR, Mod LAE,  Trivial TR, PA peak pressure 25 mm Hg. - TEE 11/13/2017 EF 40-45%  - NYHA II symptoms - Volume status stable without diuretics.  - Continue spiro 12.5 mg daily.  - Continue coreg 3.125 mg BID.  - Add losartan 25 mg BID. (was supposed to be on losartan 25 mg qhs after discharge)  2. CAD s/p CABG 11/13/17 - LHC 3/10/19with 60% ostial LAD, complex  trifurcation lesion at prox LAD/SP/D1 of 85-90 and 95% and diffuse 85% in mid LAD, 70% D1, 60-80% ostial LCx, 40-50% OM in co-dominant LCx and 90% distal RCA and 60-70% mid RCA with EF 35%. - Continue ASA and statin.  - BB as above.    3.Hyperlipidemia LDL goal <70 - LDL 91 11/10/17. -Onhigh dose statin -Hgb A1C 5.6. - No change. Per PCP.   4. Hypokalemia - BMET today.   5. H/o Non-Hodgkins lymphoma 2009 - treated with chemo. No XRT - No change.   Doing well with relatively un-complicated course.  Will continue to follow in CHF clinic for now. Repeat EF in 3-4 months with GDMT. Suspect will be able to graduate to [facility] after several visits and medication titration.   Shirley Friar, PA-C 11/30/17   Greater than 50% of the 25 minute visit was spent in counseling/coordination of care regarding disease state education, salt/fluid restriction, sliding scale diuretics, and medication compliance.

## 2017-11-30 NOTE — Patient Instructions (Signed)
START Losartan 25 mg tablet twice daily.  Routine lab work today. Will notify you of abnormal results, otherwise no news is good news!  Follow up 3 weeks with CHF clinical pharmacist Ileene Patrick.  _________________________________________________________ Edwin Morris Code: 1100  Follow up 2 months with Dr. Haroldine Laws.  _________________________________________________________ Edwin Morris Code: 4332  Take all medication as prescribed the day of your appointment. Bring all medications with you to your appointment.  Do the following things EVERYDAY: 1) Weigh yourself in the morning before breakfast. Write it down and keep it in a log. 2) Take your medicines as prescribed 3) Eat low salt foods-Limit salt (sodium) to 2000 mg per day.  4) Stay as active as you can everyday 5) Limit all fluids for the day to less than 2 liters

## 2017-12-02 ENCOUNTER — Telehealth (HOSPITAL_COMMUNITY): Payer: Self-pay

## 2017-12-02 NOTE — Telephone Encounter (Signed)
Patient returned phone call in regards to Cardiac Rehab - Scheduled orientation on 01/28/2018 at 8:30am. Patient will attend the 8:15am exc class. Mailed packet.

## 2017-12-02 NOTE — Telephone Encounter (Signed)
Attempted to call patient in regards to Cardiac Rehab - lm on vm °

## 2017-12-10 ENCOUNTER — Other Ambulatory Visit: Payer: Self-pay | Admitting: Cardiothoracic Surgery

## 2017-12-10 DIAGNOSIS — Z951 Presence of aortocoronary bypass graft: Secondary | ICD-10-CM

## 2017-12-14 ENCOUNTER — Ambulatory Visit (INDEPENDENT_AMBULATORY_CARE_PROVIDER_SITE_OTHER): Payer: Self-pay | Admitting: Physician Assistant

## 2017-12-14 ENCOUNTER — Ambulatory Visit
Admission: RE | Admit: 2017-12-14 | Discharge: 2017-12-14 | Disposition: A | Discharge status: Home / Self Care | Payer: PPO | Source: Ambulatory Visit | Attending: Cardiothoracic Surgery | Admitting: Cardiothoracic Surgery

## 2017-12-14 ENCOUNTER — Other Ambulatory Visit: Payer: Self-pay

## 2017-12-14 VITALS — BP 113/69 | HR 79 | Resp 16 | Ht 72.0 in | Wt 162.0 lb

## 2017-12-14 DIAGNOSIS — I2510 Atherosclerotic heart disease of native coronary artery without angina pectoris: Secondary | ICD-10-CM

## 2017-12-14 DIAGNOSIS — Z951 Presence of aortocoronary bypass graft: Secondary | ICD-10-CM

## 2017-12-14 NOTE — Progress Notes (Signed)
HPI: Patient returns for routine postoperative follow-up having undergone CABG x 5  on 11/13/2017. He presented after suffering a witnessed cardiac arrest  The patient's early postoperative recovery while in the hospital was notable for Atrial fibrillation with conversion to NSR. Since hospital discharge the patient reports he is doing well.  He feels great and has not required any pain medication since discharge.  He is anxious to increase his activity levels.  He is ambulating without issue several times per day.  He states his incisions are healing without evidence of infection.  His weight has been stable and he denies lower extremity edema.   Current Outpatient Medications  Medication Sig Dispense Refill  . amiodarone (PACERONE) 200 MG tablet Take 1 tablet (200 mg total) by mouth daily. 50 tablet 1  . aspirin EC 81 MG EC tablet Take 1 tablet (81 mg total) by mouth daily. 30 tablet 0  . atorvastatin (LIPITOR) 80 MG tablet Take 1 tablet (80 mg total) by mouth daily at 6 PM. 30 tablet 1  . carvedilol (COREG) 3.125 MG tablet Take 1 tablet (3.125 mg total) by mouth 2 (two) times daily with a meal. 60 tablet 1  . cholecalciferol (VITAMIN D) 1000 UNITS tablet Take 1,000 Units by mouth daily.    . clopidogrel (PLAVIX) 75 MG tablet Take 1 tablet (75 mg total) by mouth daily. 30 tablet 1  . co-enzyme Q-10 50 MG capsule Take 50 mg by mouth daily.    . Cyanocobalamin (RA VITAMIN B-12 TR) 1000 MCG TBCR Take 1,000 mcg by mouth daily.    . finasteride (PROSCAR) 5 MG tablet Take 5 mg by mouth daily.    . fish oil-omega-3 fatty acids 1000 MG capsule Take 2 g by mouth daily.    Marland Kitchen losartan (COZAAR) 25 MG tablet Take 1 tablet (25 mg total) by mouth 2 (two) times daily. 60 tablet 11  . spironolactone (ALDACTONE) 25 MG tablet Take 0.5 tablets (12.5 mg total) by mouth daily. 30 tablet 0  . traMADol (ULTRAM) 50 MG tablet Take 1-2 tablets (50-100 mg total) by mouth every 4 (four) hours as needed for moderate pain. 30  tablet 0   No current facility-administered medications for this visit.     Physical Exam:  BP 113/69 (BP Location: Right Arm, Patient Position: Sitting, Cuff Size: Large)   Pulse 79   Resp 16   Ht 6' (1.829 m)   Wt 162 lb (73.5 kg)   SpO2 99% Comment: ON RA  BMI 21.97 kg/m   Gen: no apparent distress Heart: RRR Lungs: CTA bilaterally Ext: no edema Incisions: well healed  Diagnostic Tests:  CXR: resolution of previous pleural effusions, no pneumothorax  A/P:  1. CV- S/p CABG x 5 doing well, no further A. Fib since hospital discharge, will stop Amiodarone on 12/25/2017. 2. Activity- can increase as tolerated, with weight restrictions of 10-15 lbs starting on 4/26.  He may resume driving and return to work light duty as he feels able 3. RTC prn   Ellwood Handler, PA-C [facility] [phone]

## 2017-12-14 NOTE — Patient Instructions (Signed)
You may return to driving an automobile as long as you are no longer requiring oral narcotic pain relievers during the daytime.  It would be wise to start driving only short distances during the daylight and gradually increase from there as you feel comfortable.   Make every effort to maintain a "heart-healthy" lifestyle with regular physical exercise and adherence to a low-fat, low-carbohydrate diet.  Continue to seek regular follow-up appointments with your primary care physician and/or cardiologist.   You may continue to gradually increase your physical activity as tolerated.  Refrain from any heavy lifting or strenuous use of your arms and shoulders until at least 8 weeks from the time of your surgery, and avoid activities that cause increased pain in your chest on the side of your surgical incision.  Otherwise you may continue to increase activities without any particular limitations.  Increase the intensity and duration of physical activity gradually.

## 2017-12-22 ENCOUNTER — Ambulatory Visit (HOSPITAL_COMMUNITY)
Admission: RE | Admit: 2017-12-22 | Discharge: 2017-12-22 | Disposition: A | Discharge status: Home / Self Care | Payer: PPO | Source: Ambulatory Visit | Attending: Internal Medicine | Admitting: Internal Medicine

## 2017-12-22 VITALS — BP 124/66 | HR 62 | Wt 164.2 lb

## 2017-12-22 DIAGNOSIS — I2510 Atherosclerotic heart disease of native coronary artery without angina pectoris: Secondary | ICD-10-CM | POA: Diagnosis not present

## 2017-12-22 DIAGNOSIS — E876 Hypokalemia: Secondary | ICD-10-CM | POA: Diagnosis not present

## 2017-12-22 DIAGNOSIS — E785 Hyperlipidemia, unspecified: Secondary | ICD-10-CM | POA: Insufficient documentation

## 2017-12-22 DIAGNOSIS — I110 Hypertensive heart disease with heart failure: Secondary | ICD-10-CM | POA: Diagnosis not present

## 2017-12-22 DIAGNOSIS — I5022 Chronic systolic (congestive) heart failure: Secondary | ICD-10-CM | POA: Insufficient documentation

## 2017-12-22 LAB — BASIC METABOLIC PANEL
Anion gap: 8 (ref 5–15)
BUN: 15 mg/dL (ref 6–20)
CO2: 21 mmol/L — ABNORMAL LOW (ref 22–32)
Calcium: 8.9 mg/dL (ref 8.9–10.3)
Chloride: 106 mmol/L (ref 101–111)
Creatinine, Ser: 0.7 mg/dL (ref 0.61–1.24)
GFR calc Af Amer: >60 mL/min (ref >60)
GFR calc non Af Amer: >60 mL/min (ref >60)
GLUCOSE: 104 mg/dL — AB (ref 65–99)
Potassium: 4.1 mmol/L (ref 3.5–5.1)
Sodium: 135 mmol/L (ref 135–145)

## 2017-12-22 MED ORDER — SPIRONOLACTONE 25 MG PO TABS: 25.0000 mg | ORAL_TABLET | Freq: Every day | ORAL | 5 refills | Status: DC

## 2017-12-22 MED ORDER — CARVEDILOL 3.125 MG PO TABS: 3.1250 mg | ORAL_TABLET | Freq: Two times a day (BID) | ORAL | 5 refills | Status: DC

## 2017-12-22 MED ORDER — ATORVASTATIN CALCIUM 80 MG PO TABS: 80.0000 mg | ORAL_TABLET | Freq: Every day | ORAL | 5 refills | Status: DC

## 2017-12-22 NOTE — Patient Instructions (Addendum)
It was great to meet you today!  Please INCREASE your spironolactone to 25 mg (1 tablet) daily.   Lab work today. We will call you with any changes.   You are scheduled for blood work again on 01/06/18 at 10:00 AM.   Please keep your appointment Dr. Haroldine Laws on 02/01/18.

## 2017-12-22 NOTE — Progress Notes (Signed)
HF MD: Haroldine Laws  HPI:  Edwin Morris is a 72 y.o. Caucasian male chronic systolic CHF, ICM, CAD s/p CABG, HLD, NHL, and BPH.   Diagnosed with NHL in 2009 and treated with chemotherapy (no XRT) by Dr. Julien Nordmann .  PRIOR THERAPY:  1. Status post 6 cycle of systemic chemotherapy with CHOP/Rituxan with Neulasta support. Last dose of chemotherapy was given March 27, 2009 and the patient refused to continue with the last 2 cycles of his treatment. 2. Maintenance Rituxan 375 mg per meter square given every 2 months. He is status post 3 cycles, again discontinued in July 2011.  Admitted 11/08/17 with chest tightness and pressure. During transport via EMS had VF arrest requiring defibrillation and transient CPR. ECG with ST elevation in III, aVF, V3, and V4 with RBBB. Intubated for airway protection. Taken emergently to cath which showed severe multivessel disease. CVTS consulted 11/09/17 and taken for CABG 11/13/17. HF team followed for med titration as tolerated. He had a relatively uncomplicated post op course. CR consulted.   He presents today with his wife for pharmacist-led HF medication titration. At last HF clinic visit on 4/1, his losartan was increased to 25 mg BID. Has been doing well overall.  Due to back log, doesn't start cardiac rehab until May/June.  He has been taking his medicines as directed. 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 He sees CVTS 12/14/17. He denies swelling, orthopnea or PND.    Marland Kitchen Shortness of breath/dyspnea on exertion? no  . Orthopnea/PND? no . Edema? no . Lightheadedness/dizziness? no . Daily weights at home? Yes - stable 160-162 lb . Blood pressure/heart rate monitoring at home? Yes - yesterday 127/66 mmHg . Following low-sodium/fluid-restricted diet? yes  HF Medications: Carvedilol 3.125 mg PO BID Losartan 25 mg PO BID Spironolactone 12.5 mg PO daily   Has the patient been experiencing any side effects to the medications prescribed?  no  Does the  patient have any problems obtaining medications due to transportation or finances?   No - HTA Part D  Understanding of regimen: good Understanding of indications: good Potential of compliance: good Patient understands to avoid NSAIDs. Patient understands to avoid decongestants.    Pertinent Lab Values: . 12/22/17: Serum creatinine 0.70, BUN 15, Potassium 4.1, Sodium 135  Vital Signs: . Weight: 164 lb (dry weight: 162 lb) . Blood pressure: 124/66 mmHg  . Heart rate: 62 bpm    Assessment: 1. Chronicsystolic CHF (EF 65-78%), due to ICM. NYHA class IIsymptoms.  - Volume status stable  - Increase spironolactone to 25 mg daily  - Continue carvedilol 3.125 mg BID and losartan 25 mg BID - Basic disease state pathophysiology, medication indication, mechanism and side effects reviewed at length with patient and he verbalized understanding 2. CAD s/p CABG 11/13/17 - LHC 3/10/19with 60% ostial LAD, complex trifurcation lesion at prox LAD/SP/D1 of 85-90 and 95% and diffuse 85% in mid LAD, 70% D1, 60-80% ostial LCx, 40-50% OM in co-dominant LCx and 90% distal RCA and 60-70% mid RCA with EF 35%.  - Continue ASA, statin, ARB, AA and BB as above  3.Hyperlipidemia LDL goal <70 - LDL 91 on 11/10/17 -Onhigh dose statin - No change. Per PCP.  4. Hypokalemia -BMET today.  5. H/o Non-Hodgkins lymphoma 2009 - treated with chemo. No XRT -No change.    Plan: 1) Medication changes: Based on clinical presentation, vital signs and recent labs will increase spironolactone to 25 mg daily 2) Labs: BMET today and in  2 weeks 3) Follow-up: Dr. Haroldine Laws on 02/01/18   Ruta Hinds. Velva Harman, PharmD, BCPS, CPP Clinical Pharmacist Pager: [phone] Phone: [phone] 12/22/2017 10:26 AM

## 2018-01-04 ENCOUNTER — Telehealth: Payer: Self-pay

## 2018-01-04 NOTE — Telephone Encounter (Signed)
Called and stated he is to have a teeth cleaning and xray.  Advised patient's wife that he does not need any premedication before going to the dentist.

## 2018-01-06 ENCOUNTER — Ambulatory Visit (HOSPITAL_COMMUNITY)
Admission: RE | Admit: 2018-01-06 | Discharge: 2018-01-06 | Disposition: A | Discharge status: Home / Self Care | Payer: PPO | Source: Ambulatory Visit | Attending: Cardiology | Admitting: Cardiology

## 2018-01-06 ENCOUNTER — Other Ambulatory Visit (HOSPITAL_COMMUNITY): Payer: Self-pay | Admitting: *Deleted

## 2018-01-06 DIAGNOSIS — I5022 Chronic systolic (congestive) heart failure: Secondary | ICD-10-CM

## 2018-01-06 LAB — BASIC METABOLIC PANEL
ANION GAP: 6 (ref 5–15)
BUN: 11 mg/dL (ref 6–20)
CALCIUM: 9.1 mg/dL (ref 8.9–10.3)
CO2: 28 mmol/L (ref 22–32)
Chloride: 107 mmol/L (ref 101–111)
Creatinine, Ser: 0.77 mg/dL (ref 0.61–1.24)
GLUCOSE: 94 mg/dL (ref 65–99)
Potassium: 5.2 mmol/L — ABNORMAL HIGH (ref 3.5–5.1)
Sodium: 141 mmol/L (ref 135–145)

## 2018-01-14 DIAGNOSIS — Z125 Encounter for screening for malignant neoplasm of prostate: Secondary | ICD-10-CM | POA: Diagnosis not present

## 2018-01-15 ENCOUNTER — Other Ambulatory Visit: Payer: Self-pay | Admitting: *Deleted

## 2018-01-15 DIAGNOSIS — I469 Cardiac arrest, cause unspecified: Secondary | ICD-10-CM

## 2018-01-15 MED ORDER — CLOPIDOGREL BISULFATE 75 MG PO TABS: 75.0000 mg | ORAL_TABLET | Freq: Every day | ORAL | 0 refills | Status: DC

## 2018-01-20 ENCOUNTER — Telehealth (HOSPITAL_COMMUNITY): Payer: Self-pay | Admitting: Pharmacist

## 2018-01-28 ENCOUNTER — Encounter (HOSPITAL_COMMUNITY): Payer: Self-pay

## 2018-01-28 ENCOUNTER — Encounter (HOSPITAL_COMMUNITY)
Admission: RE | Admit: 2018-01-28 | Discharge: 2018-01-28 | Disposition: A | Discharge status: Home / Self Care | Payer: PPO | Source: Ambulatory Visit | Attending: Cardiovascular Disease | Admitting: Cardiovascular Disease

## 2018-01-28 VITALS — Ht 72.0 in | Wt 162.7 lb

## 2018-01-28 DIAGNOSIS — Z951 Presence of aortocoronary bypass graft: Secondary | ICD-10-CM

## 2018-01-28 NOTE — Progress Notes (Signed)
Cardiac Rehab Medication Review by a RN  Does the patient feel that his/her medications are working for him/her?  yes  Has the patient been experiencing any side effects to the medications prescribed?  no  Does the patient measure his/her own blood pressure or blood glucose at home?  yes   Does the patient have any problems obtaining medications due to transportation or finances?   no  Understanding of regimen: good Understanding of indications: good Potential of compliance: good    RN comments: Pt presents to cardiac rehab today for orientation.  Had some questions about being on aspirin and clopidogrel inquiring "Why do I need to be on aspirin if I'm on that blood thinner?"  Explained both medications to patient.  Understanding verbalized.     Noel Christmas 01/28/2018 9:36 AM

## 2018-01-28 NOTE — Telephone Encounter (Signed)
Cardiac Rehab - Pharmacy Resident Documentation   Unable to complete medication reconciliation after 4 call attempts, I was able to speak with patient but each time he was not able to complete med rec due to timing. Please complete allergy verification and medication review during patient's cardiac rehab appointment.     Jalene Mullet, Pharm.D. PGY1 Pharmacy Resident 01/28/2018 8:27 AM Main Pharmacy: [phone]

## 2018-01-28 NOTE — Progress Notes (Signed)
Edwin Morris 72 y.o. male DOB: April 18, 1946 MRN: [id number]      Nutrition Note  1. S/P CABG x 5    Past Medical History:  Diagnosis Date  . BPH (benign prostatic hyperplasia)   . Hyperlipidemia   . NHL (non-Hodgkin's lymphoma) ([place])    nhl dx 10/2008   Meds reviewed.  HT: Ht Readings from Last 1 Encounters:  01/28/18 6' (1.829 m)    WT: Wt Readings from Last 5 Encounters:  01/28/18 162 lb 11.2 oz (73.8 kg)  12/22/17 164 lb 3.2 oz (74.5 kg)  12/14/17 162 lb (73.5 kg)  11/30/17 163 lb 3.2 oz (74 kg)  11/17/17 172 lb 9.6 oz (78.3 kg)     Body mass index is 22.07 kg/m.   Current tobacco use? No   Labs:  Lipid Panel     Component Value Date/Time   CHOL 146 11/10/2017 0550   TRIG 75 11/10/2017 0550   HDL 40 (L) 11/10/2017 0550   CHOLHDL 3.7 11/10/2017 0550   VLDL 15 11/10/2017 0550   LDLCALC 91 11/10/2017 0550    Lab Results  Component Value Date   HGBA1C 5.6 11/11/2017   CBG (last 3)  No results for input(s): GLUCAP in the last 72 hours.  Nutrition Note Spoke with pt and pt's wife. Nutrition plan and goals reviewed with pt. Pt is following Step 1 of the Therapeutic Lifestyle Changes diet. Pt reports 10 lb wt loss after surgery. Pt wants to gain wt back to his UBW of ~ 168-170 lb. Pt has not been actively trying to gain wt at this time. Wt gain discussed. Pt with dx of CHF. Per discussion, pt watches sodium intake. Pt eats out 2 times/week. Ways to make lower sodium meal choices when eating out reviewed. Pt expressed understanding of the information reviewed. Pt aware of nutrition education classes offered.  Nutrition Diagnosis ? Food-and nutrition-related knowledge deficit related to lack of exposure to information as related to diagnosis of: ? CVD ? Unintentional wt loss related to recent surgery and heart healthy dietary changes as evidenced by wt loss of 5.9% over the past 2 months.  Nutrition Intervention ? Pt's individual nutrition plan and goals reviewed  with pt. ? Pt given handouts for: ? Nutrition I class ? Nutrition II class   Nutrition Goal(s):  ? Pt to identify food quantities necessary to achieve weight gain of 6-10 lb at graduation from cardiac rehab. Goal wt of 168-170 lb desired.   Plan:  Pt to attend nutrition classes ? Nutrition I ? Nutrition II ? Portion Distortion  Will provide client-centered nutrition education as part of interdisciplinary care.   Monitor and evaluate progress toward nutrition goal with team.  Derek Mound, M.Ed, RD, LDN, CDE 01/28/2018 10:55 AM

## 2018-01-28 NOTE — Progress Notes (Addendum)
Cardiac Individual Treatment Plan  Patient Details  Name: Edwin Morris MRN: [id number] Date of Birth: 1946/08/31 Referring Provider:     CARDIAC REHAB PHASE II ORIENTATION from 01/28/2018 in [facility]  Referring Provider  Shelva Majestic MD      Initial Encounter Date:    CARDIAC REHAB PHASE II ORIENTATION from 01/28/2018 in [place]  Date  01/28/18  Referring Provider  Shelva Majestic MD      Visit Diagnosis: S/P CABG x 5  Patient's Home Medications on Admission:  Current Outpatient Medications:  .  aspirin EC 81 MG EC tablet, Take 1 tablet (81 mg total) by mouth daily., Disp: 30 tablet, Rfl: 0 .  atorvastatin (LIPITOR) 80 MG tablet, Take 1 tablet (80 mg total) by mouth daily at 6 PM., Disp: 30 tablet, Rfl: 5 .  carvedilol (COREG) 3.125 MG tablet, Take 1 tablet (3.125 mg total) by mouth 2 (two) times daily with a meal., Disp: 60 tablet, Rfl: 5 .  cholecalciferol (VITAMIN D) 1000 UNITS tablet, Take 1,000 Units by mouth daily., Disp: , Rfl:  .  clopidogrel (PLAVIX) 75 MG tablet, Take 1 tablet (75 mg total) by mouth daily., Disp: 30 tablet, Rfl: 0 .  diphenhydrAMINE (BENADRYL ALLERGY) 25 mg capsule, Take 25 mg by mouth at bedtime as needed for sleep., Disp: , Rfl:  .  finasteride (PROSCAR) 5 MG tablet, Take 5 mg by mouth daily., Disp: , Rfl:  .  fish oil-omega-3 fatty acids 1000 MG capsule, Take 2 g by mouth daily., Disp: , Rfl:  .  losartan (COZAAR) 25 MG tablet, Take 1 tablet (25 mg total) by mouth 2 (two) times daily., Disp: 60 tablet, Rfl: 11 .  spironolactone (ALDACTONE) 25 MG tablet, Take 1 tablet (25 mg total) by mouth daily., Disp: 30 tablet, Rfl: 5 .  amiodarone (PACERONE) 200 MG tablet, Take 1 tablet (200 mg total) by mouth daily. (Patient not taking: Reported on 01/28/2018), Disp: 50 tablet, Rfl: 1  Past Medical History: Past Medical History:  Diagnosis Date  . BPH (benign prostatic hyperplasia)   .  Hyperlipidemia   . NHL (non-Hodgkin's lymphoma) ([place])    nhl dx 10/2008    Tobacco Use: Social History   Tobacco Use  Smoking Status Never Smoker  Smokeless Tobacco Never Used    Labs: Recent Review Flowsheet Data    Labs for ITP Cardiac and Pulmonary Rehab Latest Ref Rng & Units 11/13/2017 11/13/2017 11/13/2017 11/14/2017 11/15/2017   Cholestrol 0 - 200 mg/dL - - - - -   LDLCALC 0 - 99 mg/dL - - - - -   HDL >40 mg/dL - - - - -   Trlycerides <150 mg/dL - - - - -   Hemoglobin A1c 4.8 - 5.6 % - - - - -   PHART 7.350 - 7.450 7.371 7.436 7.436 - -   PCO2ART 32.0 - 48.0 mmHg 40.7 34.2 35.0 - -   HCO3 20.0 - 28.0 mmol/L 23.8 23.0 23.6 - -   TCO2 22 - 32 mmol/L 25 24 25 23  -   ACIDBASEDEF 0.0 - 2.0 mmol/L 2.0 1.0 - - -   O2SAT % 96.0 99.0 97.0 - 65.4      Capillary Blood Glucose: Lab Results  Component Value Date   GLUCAP 117 (H) 11/16/2017   GLUCAP 136 (H) 11/16/2017   GLUCAP 119 (H) 11/16/2017   GLUCAP 118 (H) 11/16/2017   GLUCAP 110 (H) 11/15/2017  Exercise Target Goals: Date: 01/28/18  Exercise Program Goal: Individual exercise prescription set using results from initial 6 min walk test and THRR while considering  patient's activity barriers and safety.   Exercise Prescription Goal: Initial exercise prescription builds to 30-45 minutes a day of aerobic activity, 2-3 days per week.  Home exercise guidelines will be given to patient during program as part of exercise prescription that the participant will acknowledge.  Activity Barriers & Risk Stratification: Activity Barriers & Cardiac Risk Stratification - 01/28/18 0913      Activity Barriers & Cardiac Risk Stratification   Activity Barriers  Deconditioning;Muscular Weakness;Other (comment)    Comments  R sided sciatica pain, B shoulder discomfort (spur)    Cardiac Risk Stratification  High       6 Minute Walk: 6 Minute Walk    Row Name 01/28/18 1022 01/28/18 1040       6 Minute Walk   Phase  Initial  -     Distance  1520 feet  -    Walk Time  6 minutes  -    # of Rest Breaks  0  -    MPH  2.9  -    METS  3.4  -    RPE  11  -    Resting HR  51 bpm  -    Resting BP  122/70  -    Resting Oxygen Saturation   98 %  -    Exercise Oxygen Saturation  during 6 min walk  99 %  -    Max Ex. HR  73 bpm  -    Max Ex. BP  126/78  -    2 Minute Post BP  -  110/62       Oxygen Initial Assessment:   Oxygen Re-Evaluation:   Oxygen Discharge (Final Oxygen Re-Evaluation):   Initial Exercise Prescription: Initial Exercise Prescription - 01/28/18 1000      Date of Initial Exercise RX and Referring Provider   Date  01/28/18    Referring Provider  Shelva Majestic MD      Treadmill   MPH  2.8    Grade  0    Minutes  10    METs  3.14      Bike   Level  0.5    Minutes  10    METs  2.3      NuStep   Level  3    SPM  80    Minutes  10    METs  2.5      Prescription Details   Frequency (times per week)  3    Duration  Progress to 45 minutes of aerobic exercise without signs/symptoms of physical distress      Intensity   THRR 40-80% of Max Heartrate  60-119    Ratings of Perceived Exertion  11-13    Perceived Dyspnea  0-4      Progression   Progression  Continue to progress workloads to maintain intensity without signs/symptoms of physical distress.      Resistance Training   Training Prescription  Yes    Weight  4lbs    Reps  10-15       Perform Capillary Blood Glucose checks as needed.  Exercise Prescription Changes:   Exercise Comments:   Exercise Goals and Review: Exercise Goals    Row Name 01/28/18 0914             Exercise Goals  Increase Physical Activity  Yes       Intervention  Provide advice, education, support and counseling about physical activity/exercise needs.;Develop an individualized exercise prescription for aerobic and resistive training based on initial evaluation findings, risk stratification, comorbidities and participant's personal goals.        Expected Outcomes  Short Term: Attend rehab on a regular basis to increase amount of physical activity.;Long Term: Exercising regularly at least 3-5 days a week.;Long Term: Add in home exercise to make exercise part of routine and to increase amount of physical activity.       Increase Strength and Stamina  Yes return to playing golf       Intervention  Provide advice, education, support and counseling about physical activity/exercise needs.;Develop an individualized exercise prescription for aerobic and resistive training based on initial evaluation findings, risk stratification, comorbidities and participant's personal goals.       Expected Outcomes  Short Term: Increase workloads from initial exercise prescription for resistance, speed, and METs.;Short Term: Perform resistance training exercises routinely during rehab and add in resistance training at home;Long Term: Improve cardiorespiratory fitness, muscular endurance and strength as measured by increased METs and functional capacity (6MWT)       Able to understand and use rate of perceived exertion (RPE) scale  Yes       Intervention  Provide education and explanation on how to use RPE scale       Expected Outcomes  Short Term: Able to use RPE daily in rehab to express subjective intensity level;Long Term:  Able to use RPE to guide intensity level when exercising independently       Knowledge and understanding of Target Heart Rate Range (THRR)  Yes       Intervention  Provide education and explanation of THRR including how the numbers were predicted and where they are located for reference       Expected Outcomes  Short Term: Able to state/look up THRR;Long Term: Able to use THRR to govern intensity when exercising independently;Short Term: Able to use daily as guideline for intensity in rehab       Able to check pulse independently  Yes       Intervention  Provide education and demonstration on how to check pulse in carotid and radial  arteries.;Review the importance of being able to check your own pulse for safety during independent exercise       Expected Outcomes  Short Term: Able to explain why pulse checking is important during independent exercise;Long Term: Able to check pulse independently and accurately       Understanding of Exercise Prescription  Yes       Intervention  Provide education, explanation, and written materials on patient's individual exercise prescription       Expected Outcomes  Short Term: Able to explain program exercise prescription;Long Term: Able to explain home exercise prescription to exercise independently          Exercise Goals Re-Evaluation :    Discharge Exercise Prescription (Final Exercise Prescription Changes):   Nutrition:  Target Goals: Understanding of nutrition guidelines, daily intake of sodium 1500mg , cholesterol 200mg , calories 30% from fat and 7% or less from saturated fats, daily to have 5 or more servings of fruits and vegetables.  Biometrics: Pre Biometrics - 01/28/18 1035      Pre Biometrics   Height  6' (1.829 m)    Weight  162 lb 11.2 oz (73.8 kg)    Waist Circumference  34.75 inches  Hip Circumference  36.75 inches    Waist to Hip Ratio  0.95 %    BMI (Calculated)  22.06    Triceps Skinfold  10 mm    % Body Fat  21.2 %    Grip Strength  32 kg    Flexibility  11 in    Single Leg Stand  6.63 seconds        Nutrition Therapy Plan and Nutrition Goals: Nutrition Therapy & Goals - 01/28/18 1121      Nutrition Therapy   Diet  Heart Healthy      Personal Nutrition Goals   Nutrition Goal  1-2 lb wt gain/week to a wt gain goal of 6-10 lb at graduation from Cardiac Rehab. Goal wt of 168-170 lb desired.       Intervention Plan   Intervention  Prescribe, educate and counsel regarding individualized specific dietary modifications aiming towards targeted core components such as weight, hypertension, lipid management, diabetes, heart failure and other  comorbidities.    Expected Outcomes  Short Term Goal: Understand basic principles of dietary content, such as calories, fat, sodium, cholesterol and nutrients.;Long Term Goal: Adherence to prescribed nutrition plan.       Nutrition Assessments: Nutrition Assessments - 01/28/18 1121      MEDFICTS Scores   Pre Score  68       Nutrition Goals Re-Evaluation:   Nutrition Goals Re-Evaluation:   Nutrition Goals Discharge (Final Nutrition Goals Re-Evaluation):   Psychosocial: Target Goals: Acknowledge presence or absence of significant depression and/or stress, maximize coping skills, provide positive support system. Participant is able to verbalize types and ability to use techniques and skills needed for reducing stress and depression.  Initial Review & Psychosocial Screening: Initial Psych Review & Screening - 01/28/18 1124      Initial Review   Current issues with  None Identified      Family Dynamics   Good Support System?  Yes Pt presented with wife to orientation and notes her to be a source of support.       Barriers   Psychosocial barriers to participate in program  There are no identifiable barriers or psychosocial needs.      Screening Interventions   Interventions  Encouraged to exercise       Quality of Life Scores: Quality of Life - 01/28/18 0850      Quality of Life Scores   Health/Function Pre  26.14 %    Socioeconomic Pre  29.14 %    Psych/Spiritual Pre  30 %    Family Pre  28.5 %    GLOBAL Pre  27.94 %      Scores of 19 and below usually indicate a poorer quality of life in these areas.  A difference of  2-3 points is a clinically meaningful difference.  A difference of 2-3 points in the total score of the Quality of Life Index has been associated with significant improvement in overall quality of life, self-image, physical symptoms, and general health in studies assessing change in quality of life.  PHQ-9: Recent Review Flowsheet Data    There is no  flowsheet data to display.     Interpretation of Total Score  Total Score Depression Severity:  1-4 = Minimal depression, 5-9 = Mild depression, 10-14 = Moderate depression, 15-19 = Moderately severe depression, 20-27 = Severe depression   Psychosocial Evaluation and Intervention:   Psychosocial Re-Evaluation:   Psychosocial Discharge (Final Psychosocial Re-Evaluation):   Vocational Rehabilitation: Provide vocational rehab assistance  to qualifying candidates.   Vocational Rehab Evaluation & Intervention: Vocational Rehab - 01/28/18 1115      Initial Vocational Rehab Evaluation & Intervention   Assessment shows need for Vocational Rehabilitation  No       Education: Education Goals: Education classes will be provided on a weekly basis, covering required topics. Participant will state understanding/return demonstration of topics presented.  Learning Barriers/Preferences: Learning Barriers/Preferences - 01/28/18 0913      Learning Barriers/Preferences   Learning Barriers  Sight    Learning Preferences  Skilled Demonstration;Video       Education Topics: Count Your Pulse:  -Group instruction provided by verbal instruction, demonstration, patient participation and written materials to support subject.  Instructors address importance of being able to find your pulse and how to count your pulse when at home without a heart monitor.  Patients get hands on experience counting their pulse with staff help and individually.   Heart Attack, Angina, and Risk Factor Modification:  -Group instruction provided by verbal instruction, video, and written materials to support subject.  Instructors address signs and symptoms of angina and heart attacks.    Also discuss risk factors for heart disease and how to make changes to improve heart health risk factors.   Functional Fitness:  -Group instruction provided by verbal instruction, demonstration, patient participation, and written  materials to support subject.  Instructors address safety measures for doing things around the house.  Discuss how to get up and down off the floor, how to pick things up properly, how to safely get out of a chair without assistance, and balance training.   Meditation and Mindfulness:  -Group instruction provided by verbal instruction, patient participation, and written materials to support subject.  Instructor addresses importance of mindfulness and meditation practice to help reduce stress and improve awareness.  Instructor also leads participants through a meditation exercise.    Stretching for Flexibility and Mobility:  -Group instruction provided by verbal instruction, patient participation, and written materials to support subject.  Instructors lead participants through series of stretches that are designed to increase flexibility thus improving mobility.  These stretches are additional exercise for major muscle groups that are typically performed during regular warm up and cool down.   Hands Only CPR:  -Group verbal, video, and participation provides a basic overview of AHA guidelines for community CPR. Role-play of emergencies allow participants the opportunity to practice calling for help and chest compression technique with discussion of AED use.   Hypertension: -Group verbal and written instruction that provides a basic overview of hypertension including the most recent diagnostic guidelines, risk factor reduction with self-care instructions and medication management.    Nutrition I class: Heart Healthy Eating:  -Group instruction provided by PowerPoint slides, verbal discussion, and written materials to support subject matter. The instructor gives an explanation and review of the Therapeutic Lifestyle Changes diet recommendations, which includes a discussion on lipid goals, dietary fat, sodium, fiber, plant stanol/sterol esters, sugar, and the components of a well-balanced, healthy  diet.   CARDIAC REHAB PHASE II ORIENTATION from 01/28/2018 in [place]  Date  01/28/18  Educator  RD      Nutrition II class: Lifestyle Skills:  -Group instruction provided by PowerPoint slides, verbal discussion, and written materials to support subject matter. The instructor gives an explanation and review of label reading, grocery shopping for heart health, heart healthy recipe modifications, and ways to make healthier choices when eating out.   CARDIAC REHAB PHASE  II ORIENTATION from 01/28/2018 in [facility]  Date  01/28/18  Educator  RD      Diabetes Question & Answer:  -Group instruction provided by PowerPoint slides, verbal discussion, and written materials to support subject matter. The instructor gives an explanation and review of diabetes co-morbidities, pre- and post-prandial blood glucose goals, pre-exercise blood glucose goals, signs, symptoms, and treatment of hypoglycemia and hyperglycemia, and foot care basics.   Diabetes Blitz:  -Group instruction provided by PowerPoint slides, verbal discussion, and written materials to support subject matter. The instructor gives an explanation and review of the physiology behind type 1 and type 2 diabetes, diabetes medications and rational behind using different medications, pre- and post-prandial blood glucose recommendations and Hemoglobin A1c goals, diabetes diet, and exercise including blood glucose guidelines for exercising safely.    Portion Distortion:  -Group instruction provided by PowerPoint slides, verbal discussion, written materials, and food models to support subject matter. The instructor gives an explanation of serving size versus portion size, changes in portions sizes over the last 20 years, and what consists of a serving from each food group.   Stress Management:  -Group instruction provided by verbal instruction, video, and written materials to support  subject matter.  Instructors review role of stress in heart disease and how to cope with stress positively.     Exercising on Your Own:  -Group instruction provided by verbal instruction, power point, and written materials to support subject.  Instructors discuss benefits of exercise, components of exercise, frequency and intensity of exercise, and end points for exercise.  Also discuss use of nitroglycerin and activating EMS.  Review options of places to exercise outside of rehab.  Review guidelines for sex with heart disease.   Cardiac Drugs I:  -Group instruction provided by verbal instruction and written materials to support subject.  Instructor reviews cardiac drug classes: antiplatelets, anticoagulants, beta blockers, and statins.  Instructor discusses reasons, side effects, and lifestyle considerations for each drug class.   Cardiac Drugs II:  -Group instruction provided by verbal instruction and written materials to support subject.  Instructor reviews cardiac drug classes: angiotensin converting enzyme inhibitors (ACE-I), angiotensin II receptor blockers (ARBs), nitrates, and calcium channel blockers.  Instructor discusses reasons, side effects, and lifestyle considerations for each drug class.   Anatomy and Physiology of the Circulatory System:  Group verbal and written instruction and models provide basic cardiac anatomy and physiology, with the coronary electrical and arterial systems. Review of: AMI, Angina, Valve disease, Heart Failure, Peripheral Artery Disease, Cardiac Arrhythmia, Pacemakers, and the ICD.   Other Education:  -Group or individual verbal, written, or video instructions that support the educational goals of the cardiac rehab program.   Holiday Eating Survival Tips:  -Group instruction provided by PowerPoint slides, verbal discussion, and written materials to support subject matter. The instructor gives patients tips, tricks, and techniques to help them not only  survive but enjoy the holidays despite the onslaught of food that accompanies the holidays.   Knowledge Questionnaire Score: Knowledge Questionnaire Score - 01/28/18 0848      Knowledge Questionnaire Score   Pre Score  20/24       Core Components/Risk Factors/Patient Goals at Admission: Personal Goals and Risk Factors at Admission - 01/28/18 1037      Core Components/Risk Factors/Patient Goals on Admission   Lipids  Yes    Intervention  Provide education and support for participant on nutrition & aerobic/resistive exercise along with prescribed medications to achieve  LDL 70mg , HDL >40mg .    Expected Outcomes  Short Term: Participant states understanding of desired cholesterol values and is compliant with medications prescribed. Participant is following exercise prescription and nutrition guidelines.;Long Term: Cholesterol controlled with medications as prescribed, with individualized exercise RX and with personalized nutrition plan. Value goals: LDL < 70mg , HDL > 40 mg.       Core Components/Risk Factors/Patient Goals Review:    Core Components/Risk Factors/Patient Goals at Discharge (Final Review):    ITP Comments: ITP Comments    Row Name 01/28/18 0911           ITP Comments  Dr. Fransico Him, Medical Director          Comments: Patient attended orientation from [place] to 1016 to review rules and guidelines for program. Completed 6 minute walk test, Intitial ITP, and exercise prescription.  VSS. Telemetry-SR.  Asymptomatic.

## 2018-02-01 ENCOUNTER — Other Ambulatory Visit: Payer: Self-pay

## 2018-02-01 ENCOUNTER — Encounter (HOSPITAL_COMMUNITY): Payer: Self-pay

## 2018-02-01 ENCOUNTER — Encounter (HOSPITAL_COMMUNITY)
Admission: RE | Admit: 2018-02-01 | Discharge: 2018-02-01 | Disposition: A | Discharge status: Home / Self Care | Payer: PPO | Source: Ambulatory Visit | Attending: Cardiovascular Disease | Admitting: Cardiovascular Disease

## 2018-02-01 ENCOUNTER — Encounter (HOSPITAL_COMMUNITY): Payer: PPO

## 2018-02-01 ENCOUNTER — Encounter (HOSPITAL_COMMUNITY): Payer: Self-pay | Admitting: Internal Medicine

## 2018-02-01 ENCOUNTER — Ambulatory Visit (HOSPITAL_COMMUNITY)
Admission: RE | Admit: 2018-02-01 | Discharge: 2018-02-01 | Disposition: A | Discharge status: Home / Self Care | Payer: PPO | Source: Ambulatory Visit | Attending: Internal Medicine | Admitting: Internal Medicine

## 2018-02-01 VITALS — BP 116/65 | HR 61 | Wt 162.4 lb

## 2018-02-01 DIAGNOSIS — Z7982 Long term (current) use of aspirin: Secondary | ICD-10-CM | POA: Insufficient documentation

## 2018-02-01 DIAGNOSIS — E875 Hyperkalemia: Secondary | ICD-10-CM | POA: Diagnosis not present

## 2018-02-01 DIAGNOSIS — Z8572 Personal history of non-Hodgkin lymphomas: Secondary | ICD-10-CM | POA: Insufficient documentation

## 2018-02-01 DIAGNOSIS — Z8249 Family history of ischemic heart disease and other diseases of the circulatory system: Secondary | ICD-10-CM | POA: Insufficient documentation

## 2018-02-01 DIAGNOSIS — Z9221 Personal history of antineoplastic chemotherapy: Secondary | ICD-10-CM | POA: Insufficient documentation

## 2018-02-01 DIAGNOSIS — I213 ST elevation (STEMI) myocardial infarction of unspecified site: Secondary | ICD-10-CM | POA: Insufficient documentation

## 2018-02-01 DIAGNOSIS — Z951 Presence of aortocoronary bypass graft: Secondary | ICD-10-CM | POA: Insufficient documentation

## 2018-02-01 DIAGNOSIS — Z7901 Long term (current) use of anticoagulants: Secondary | ICD-10-CM | POA: Insufficient documentation

## 2018-02-01 DIAGNOSIS — N400 Enlarged prostate without lower urinary tract symptoms: Secondary | ICD-10-CM | POA: Insufficient documentation

## 2018-02-01 DIAGNOSIS — Z79899 Other long term (current) drug therapy: Secondary | ICD-10-CM | POA: Insufficient documentation

## 2018-02-01 DIAGNOSIS — I5022 Chronic systolic (congestive) heart failure: Secondary | ICD-10-CM | POA: Insufficient documentation

## 2018-02-01 DIAGNOSIS — E785 Hyperlipidemia, unspecified: Secondary | ICD-10-CM | POA: Insufficient documentation

## 2018-02-01 DIAGNOSIS — Z7902 Long term (current) use of antithrombotics/antiplatelets: Secondary | ICD-10-CM | POA: Diagnosis not present

## 2018-02-01 DIAGNOSIS — I2510 Atherosclerotic heart disease of native coronary artery without angina pectoris: Secondary | ICD-10-CM | POA: Insufficient documentation

## 2018-02-01 DIAGNOSIS — Z48812 Encounter for surgical aftercare following surgery on the circulatory system: Secondary | ICD-10-CM | POA: Diagnosis not present

## 2018-02-01 LAB — BASIC METABOLIC PANEL
Anion gap: 10 (ref 5–15)
BUN: 12 mg/dL (ref 6–20)
CO2: 23 mmol/L (ref 22–32)
Calcium: 9.2 mg/dL (ref 8.9–10.3)
Chloride: 105 mmol/L (ref 101–111)
Creatinine, Ser: 0.64 mg/dL (ref 0.61–1.24)
GFR calc Af Amer: >60 mL/min (ref >60)
GFR calc non Af Amer: >60 mL/min (ref >60)
GLUCOSE: 106 mg/dL — AB (ref 65–99)
Potassium: 4.4 mmol/L (ref 3.5–5.1)
Sodium: 138 mmol/L (ref 135–145)

## 2018-02-01 NOTE — Progress Notes (Signed)
Advanced Heart Failure Clinic Note   Referring Physician: PCP: Bernerd Limbo, MD PCP-Cardiologist: Shelva Majestic, MD   HPI:  Edwin Morris is a 72 y.o. male chronic systolic CHF, ICM, CAD s/p CABG, HLD, NHL, and BPH.   Diagnosed with NHL in 2009 and treated with chemotherapy (no XRT) by Dr. Julien Nordmann .  PRIOR THERAPY:  1. Status post 6 cycle of systemic chemotherapy with CHOP/Rituxan with Neulasta support. Last dose of chemotherapy was given March 27, 2009 and the patient refused to continue with the last 2 cycles of his treatment. 2. Maintenance Rituxan 375 mg per meter square given every 2 months. He is status post 3 cycles, again discontinued in July 2011.  Admitted 11/08/17 with chest tightness and pressure. During transport via EMS had VF arrest requiring defibrillation and transient CPR. ECG with ST elevation in III, aVF, V3, and V4 with RBBB. Intubated for airway protection. Taken emergently to cath which showed severe multivessel disease. CVTS consulted 11/09/17 and taken for CABG 11/13/17. HF team followed for med titration as tolerated. He had a relatively uncomplicated post op course. CR consulted.   He presents today for follow up. Continues to improve. Can do all his activities without any CP or dyspnea. Denies orthopnea or PND. Compliant with all meds. Just starting CR but says he doesn't feel he needs it. Wants to go back to work. Watching salt intake. Using Mrs Deliah Boston. Last K 5.2  Review of systems complete and found to be negative unless listed in HPI.    Past Medical History:  Diagnosis Date  . BPH (benign prostatic hyperplasia)   . Hyperlipidemia   . NHL (non-Hodgkin's lymphoma) ([place])    nhl dx 10/2008    Current Outpatient Medications  Medication Sig Dispense Refill  . amiodarone (PACERONE) 200 MG tablet Take 1 tablet (200 mg total) by mouth daily. 50 tablet 1  . aspirin EC 81 MG EC tablet Take 1 tablet (81 mg total) by mouth daily. 30 tablet 0  . atorvastatin  (LIPITOR) 80 MG tablet Take 1 tablet (80 mg total) by mouth daily at 6 PM. 30 tablet 5  . carvedilol (COREG) 3.125 MG tablet Take 1 tablet (3.125 mg total) by mouth 2 (two) times daily with a meal. 60 tablet 5  . cholecalciferol (VITAMIN D) 1000 UNITS tablet Take 1,000 Units by mouth daily.    . clopidogrel (PLAVIX) 75 MG tablet Take 1 tablet (75 mg total) by mouth daily. 30 tablet 0  . diphenhydrAMINE (BENADRYL ALLERGY) 25 mg capsule Take 25 mg by mouth at bedtime as needed for sleep.    . finasteride (PROSCAR) 5 MG tablet Take 5 mg by mouth daily.    . fish oil-omega-3 fatty acids 1000 MG capsule Take 2 g by mouth daily.    Marland Kitchen losartan (COZAAR) 25 MG tablet Take 1 tablet (25 mg total) by mouth 2 (two) times daily. 60 tablet 11  . spironolactone (ALDACTONE) 25 MG tablet Take 1 tablet (25 mg total) by mouth daily. 30 tablet 5   No current facility-administered medications for this encounter.     No Known Allergies    Social History   Socioeconomic History  . Marital status: Married    Spouse name: Not on file  . Number of children: Not on file  . Years of education: Not on file  . Highest education level: Not on file  Occupational History  . Not on file  Social Needs  . Financial resource strain: Not on file  .  Food insecurity:    Worry: Not on file    Inability: Not on file  . Transportation needs:    Medical: Not on file    Non-medical: Not on file  Tobacco Use  . Smoking status: Never Smoker  . Smokeless tobacco: Never Used  Substance and Sexual Activity  . Alcohol use: Not on file  . Drug use: Not on file  . Sexual activity: Not on file  Lifestyle  . Physical activity:    Days per week: Not on file    Minutes per session: Not on file  . Stress: Not on file  Relationships  . Social connections:    Talks on phone: Not on file    Gets together: Not on file    Attends religious service: Not on file    Active member of club or organization: Not on file    Attends  meetings of clubs or organizations: Not on file    Relationship status: Not on file  . Intimate partner violence:    Fear of current or ex partner: Not on file    Emotionally abused: Not on file    Physically abused: Not on file    Forced sexual activity: Not on file  Other Topics Concern  . Not on file  Social History Narrative   ** Merged History Encounter **       Family History  Problem Relation Age of Onset  . Heart disease Maternal Uncle   . Heart disease Paternal Uncle      Vitals:   02/01/18 1024  BP: 116/65  Pulse: 61  SpO2: 99%  Weight: 162 lb 6.4 oz (73.7 kg)   Wt Readings from Last 3 Encounters:  02/01/18 162 lb 6.4 oz (73.7 kg)  01/28/18 162 lb 11.2 oz (73.8 kg)  12/22/17 164 lb 3.2 oz (74.5 kg)   PHYSICAL EXAM: General:  Well appearing. No resp difficulty HEENT: normal Neck: supple. no JVD. Carotids 2+ bilat; no bruits. No lymphadenopathy or thryomegaly appreciated. Cor: PMI nondisplaced. Sternum well healed Regular rate & rhythm. No rubs, gallops or murmurs. Lungs: clear Abdomen: soft, nontender, nondistended. No hepatosplenomegaly. No bruits or masses. Good bowel sounds. Extremities: no cyanosis, clubbing, rash, edema Neuro: alert & orientedx3, cranial nerves grossly intact. moves all 4 extremities w/o difficulty. Affect pleasant   ASSESSMENT & PLAN:  1. Chronic systolic CHF. ICM.  - Echo 11/10/17 40-45%, Grade 1 DD, Trivial AS, Trivial MR, Mod LAE, Trivial TR, PA peak pressure 25 mm Hg. - s/p CABG 3/19 - Intra-op TEE 11/13/2017 EF 40-45%  - Doing very well. - NYHA I-II symptoms - Volume status stable without diuretics.  - Continue spiro 12.5 mg daily. (stop Mrs Deliah Boston) - Continue coreg 3.125 mg BID.  - Continue losartan 25 mg BID - BP too soft to titrate - Repeat echo - Check labs  2. CAD s/p CABG 11/13/17 - LHC 3/10/19with 60% ostial LAD, complex trifurcation lesion at prox LAD/SP/D1 of 85-90 and 95% and diffuse 85% in mid LAD, 70% D1, 60-80%  ostial LCx, 40-50% OM in co-dominant LCx and 90% distal RCA and 60-70% mid RCA with EF 35%. - Continue ASA and statin and b-blocker - No s/s ischemia - Encouraged him to stick with CR for at least 1 month   3.Hyperlipidemia LDL goal <70 - LDL 91 11/10/17. -Onhigh dose statin -Hgb A1C 5.6. - Goal LDL < 70. Per PCP  4. Hyperkalemia - Stop Mrs. Dash. BMET today.   5. H/o Non-Hodgkins  lymphoma 2009 - treated with chemo. No XRT - No change.   Doing well. NYHA I-II. Will get echo if EF stable can f/u with [facility].   Glori Bickers, MD 02/01/18

## 2018-02-01 NOTE — Patient Instructions (Signed)
Stop Amiodarone  Lab today  Your physician has requested that you have an echocardiogram. Echocardiography is a painless test that uses sound waves to create images of your heart. It provides your doctor with information about the size and shape of your heart and how well your heart's Swartzlander and valves are working. This procedure takes approximately one hour. There are no restrictions for this procedure.  THIS WILL BE DONE AT [facility], THEY WILL CALL YOU TO SCHEDULE.  THEY ARE LOCATED AT: [address], [address] ([place]) [place], [place] [address] [phone]  Your physician recommends that you schedule a follow-up appointment in: 2 months with Dr Haroldine Laws

## 2018-02-01 NOTE — Progress Notes (Signed)
Daily Session Note  Patient Details  Name: Edwin Morris MRN: [id number] Date of Birth: 06-11-1946 Referring Provider:   Flowsheet Row CARDIAC REHAB PHASE II ORIENTATION from 01/28/2018 in [facility]  Referring Provider  Shelva Majestic MD      Encounter Date: 02/01/2018  Check In: Session Check In - 02/01/18 0859    Check-In          Location  [facility]    Staff Present  Dorna Bloom, MS, ACSM RCEP, Exercise Physiologist;Tara Karle Starch, RN [facility], MS, ACSM RCEP, Exercise Physiologist;Adrijana Haros, RN, BSN    Supervising physician immediately available to respond to emergencies  [facility] immediately available    Physician(s)  Dr. Erlinda Hong    Medication changes reported      No    Fall or balance concerns reported     No    Tobacco Cessation  No Change    Warm-up and Cool-down  Performed as group-led instruction    Resistance Training Performed  Yes    VAD Patient?  No        Pain Assessment          Currently in Pain?  No/denies    Multiple Pain Sites  No           Capillary Blood Glucose: No results found for this or any previous visit (from the past 24 hour(s)).    Social History   Tobacco Use  Smoking Status Never Smoker  Smokeless Tobacco Never Used    Goals Met:  Exercise tolerated well  Goals Unmet:  Not Applicable  Comments: Pt started cardiac rehab today.  Pt tolerated light exercise without difficulty. VSS, telemetry-sinus rhythm,  asymptomatic.  Medication list reconciled. Pt denies barriers to medicaiton compliance.  PSYCHOSOCIAL ASSESSMENT:  PHQ-0. Marland Kitchen Pt exhibits positive coping skills, hopeful outlook with supportive family. No psychosocial needs identified at this time, no psychosocial interventions necessary.    Pt enjoys playing golf.   Pt oriented to exercise equipment and routine.    Understanding verbalized.   Dr. Fransico Him is Medical Director for Cardiac Rehab at [facility].

## 2018-02-03 ENCOUNTER — Encounter (HOSPITAL_COMMUNITY)
Admission: RE | Admit: 2018-02-03 | Discharge: 2018-02-03 | Disposition: A | Discharge status: Home / Self Care | Payer: PPO | Source: Ambulatory Visit | Attending: Cardiovascular Disease | Admitting: Cardiovascular Disease

## 2018-02-03 ENCOUNTER — Encounter (HOSPITAL_COMMUNITY): Payer: PPO

## 2018-02-03 DIAGNOSIS — Z951 Presence of aortocoronary bypass graft: Secondary | ICD-10-CM

## 2018-02-03 DIAGNOSIS — Z48812 Encounter for surgical aftercare following surgery on the circulatory system: Secondary | ICD-10-CM | POA: Diagnosis not present

## 2018-02-04 ENCOUNTER — Other Ambulatory Visit: Payer: Self-pay

## 2018-02-04 ENCOUNTER — Ambulatory Visit (HOSPITAL_COMMUNITY): Payer: PPO | Attending: Cardiology

## 2018-02-04 DIAGNOSIS — I083 Combined rheumatic disorders of mitral, aortic and tricuspid valves: Secondary | ICD-10-CM | POA: Insufficient documentation

## 2018-02-04 DIAGNOSIS — E785 Hyperlipidemia, unspecified: Secondary | ICD-10-CM | POA: Diagnosis not present

## 2018-02-04 DIAGNOSIS — R350 Frequency of micturition: Secondary | ICD-10-CM | POA: Diagnosis not present

## 2018-02-04 DIAGNOSIS — I5022 Chronic systolic (congestive) heart failure: Secondary | ICD-10-CM

## 2018-02-04 DIAGNOSIS — N401 Enlarged prostate with lower urinary tract symptoms: Secondary | ICD-10-CM | POA: Diagnosis not present

## 2018-02-05 ENCOUNTER — Encounter (HOSPITAL_COMMUNITY)
Admission: RE | Admit: 2018-02-05 | Discharge: 2018-02-05 | Disposition: A | Discharge status: Home / Self Care | Payer: PPO | Source: Ambulatory Visit | Attending: Cardiovascular Disease | Admitting: Cardiovascular Disease

## 2018-02-05 ENCOUNTER — Encounter (HOSPITAL_COMMUNITY): Payer: PPO

## 2018-02-05 DIAGNOSIS — Z48812 Encounter for surgical aftercare following surgery on the circulatory system: Secondary | ICD-10-CM | POA: Diagnosis not present

## 2018-02-05 DIAGNOSIS — Z951 Presence of aortocoronary bypass graft: Secondary | ICD-10-CM

## 2018-02-08 ENCOUNTER — Encounter (HOSPITAL_COMMUNITY)
Admission: RE | Admit: 2018-02-08 | Discharge: 2018-02-08 | Disposition: A | Discharge status: Home / Self Care | Payer: PPO | Source: Ambulatory Visit | Attending: Cardiovascular Disease | Admitting: Cardiovascular Disease

## 2018-02-08 ENCOUNTER — Encounter (HOSPITAL_COMMUNITY): Payer: PPO

## 2018-02-08 DIAGNOSIS — Z951 Presence of aortocoronary bypass graft: Secondary | ICD-10-CM

## 2018-02-08 DIAGNOSIS — Z48812 Encounter for surgical aftercare following surgery on the circulatory system: Secondary | ICD-10-CM | POA: Diagnosis not present

## 2018-02-10 ENCOUNTER — Encounter (HOSPITAL_COMMUNITY)
Admission: RE | Admit: 2018-02-10 | Discharge: 2018-02-10 | Disposition: A | Discharge status: Home / Self Care | Payer: PPO | Source: Ambulatory Visit | Attending: Cardiovascular Disease | Admitting: Cardiovascular Disease

## 2018-02-10 ENCOUNTER — Encounter (HOSPITAL_COMMUNITY): Payer: PPO

## 2018-02-10 DIAGNOSIS — Z951 Presence of aortocoronary bypass graft: Secondary | ICD-10-CM

## 2018-02-10 DIAGNOSIS — Z48812 Encounter for surgical aftercare following surgery on the circulatory system: Secondary | ICD-10-CM | POA: Diagnosis not present

## 2018-02-11 ENCOUNTER — Encounter (HOSPITAL_COMMUNITY): Payer: Self-pay

## 2018-02-11 NOTE — Progress Notes (Signed)
Cardiac Individual Treatment Plan  Patient Details  Name: Edwin Morris MRN: [id number] Date of Birth: 12/18/1945 Referring Provider:   Flowsheet Row CARDIAC REHAB PHASE II ORIENTATION from 01/28/2018 in [place]  Referring Provider  Shelva Majestic MD      Initial Encounter Date:  [place] PHASE II ORIENTATION from 01/28/2018 in [place]  Date  01/28/18  Referring Provider  Shelva Majestic MD      Visit Diagnosis: S/P CABG x 5  Patient's Home Medications on Admission:  Current Outpatient Medications:  .  aspirin EC 81 MG EC tablet, Take 1 tablet (81 mg total) by mouth daily., Disp: 30 tablet, Rfl: 0 .  atorvastatin (LIPITOR) 80 MG tablet, Take 1 tablet (80 mg total) by mouth daily at 6 PM., Disp: 30 tablet, Rfl: 5 .  carvedilol (COREG) 3.125 MG tablet, Take 1 tablet (3.125 mg total) by mouth 2 (two) times daily with a meal., Disp: 60 tablet, Rfl: 5 .  cholecalciferol (VITAMIN D) 1000 UNITS tablet, Take 1,000 Units by mouth daily., Disp: , Rfl:  .  clopidogrel (PLAVIX) 75 MG tablet, Take 1 tablet (75 mg total) by mouth daily., Disp: 30 tablet, Rfl: 0 .  diphenhydrAMINE (BENADRYL ALLERGY) 25 mg capsule, Take 25 mg by mouth at bedtime as needed for sleep., Disp: , Rfl:  .  finasteride (PROSCAR) 5 MG tablet, Take 5 mg by mouth daily., Disp: , Rfl:  .  fish oil-omega-3 fatty acids 1000 MG capsule, Take 2 g by mouth daily., Disp: , Rfl:  .  losartan (COZAAR) 25 MG tablet, Take 1 tablet (25 mg total) by mouth 2 (two) times daily., Disp: 60 tablet, Rfl: 11 .  spironolactone (ALDACTONE) 25 MG tablet, Take 1 tablet (25 mg total) by mouth daily., Disp: 30 tablet, Rfl: 5  Past Medical History: Past Medical History:  Diagnosis Date  . BPH (benign prostatic hyperplasia)   . Hyperlipidemia   . NHL (non-Hodgkin's lymphoma) ([place])    nhl dx 10/2008    Tobacco Use: Social History   Tobacco Use  Smoking  Status Never Smoker  Smokeless Tobacco Never Used    Labs: Recent Review Flowsheet Data    Labs for ITP Cardiac and Pulmonary Rehab Latest Ref Rng & Units 11/13/2017 11/13/2017 11/13/2017 11/14/2017 11/15/2017   Cholestrol 0 - 200 mg/dL - - - - -   LDLCALC 0 - 99 mg/dL - - - - -   HDL >40 mg/dL - - - - -   Trlycerides <150 mg/dL - - - - -   Hemoglobin A1c 4.8 - 5.6 % - - - - -   PHART 7.350 - 7.450 7.371 7.436 7.436 - -   PCO2ART 32.0 - 48.0 mmHg 40.7 34.2 35.0 - -   HCO3 20.0 - 28.0 mmol/L 23.8 23.0 23.6 - -   TCO2 22 - 32 mmol/L 25 24 25 23  -   ACIDBASEDEF 0.0 - 2.0 mmol/L 2.0 1.0 - - -   O2SAT % 96.0 99.0 97.0 - 65.4      Capillary Blood Glucose: Lab Results  Component Value Date   GLUCAP 117 (H) 11/16/2017   GLUCAP 136 (H) 11/16/2017   GLUCAP 119 (H) 11/16/2017   GLUCAP 118 (H) 11/16/2017   GLUCAP 110 (H) 11/15/2017     Exercise Target Goals:    Exercise Program Goal: Individual exercise prescription set using results from initial 6 min walk test and THRR while considering  patient's activity barriers and safety.   Exercise Prescription Goal: Initial exercise prescription builds to 30-45 minutes a day of aerobic activity, 2-3 days per week.  Home exercise guidelines will be given to patient during program as part of exercise prescription that the participant will acknowledge.  Activity Barriers & Risk Stratification: Activity Barriers & Cardiac Risk Stratification - 01/28/18 0913    Activity Barriers & Cardiac Risk Stratification          Activity Barriers  Deconditioning;Muscular Weakness;Other (comment)    Comments  R sided sciatica pain, B shoulder discomfort (spur)    Cardiac Risk Stratification  High           6 Minute Walk: 6 Minute Walk    6 Minute Walk    Row Name 01/28/18 1022 01/28/18 1040   Phase  Initial  no documentation   Distance  1520 feet  no documentation   Walk Time  6 minutes  no documentation   # of Rest Breaks  0  no documentation    MPH  2.9  no documentation   METS  3.4  no documentation   RPE  11  no documentation   Resting HR  51 bpm  no documentation   Resting BP  122/70  no documentation   Resting Oxygen Saturation   98 %  no documentation   Exercise Oxygen Saturation  during 6 min walk  99 %  no documentation   Max Ex. HR  73 bpm  no documentation   Max Ex. BP  126/78  no documentation   2 Minute Post BP  no documentation  110/62          Oxygen Initial Assessment:   Oxygen Re-Evaluation:   Oxygen Discharge (Final Oxygen Re-Evaluation):   Initial Exercise Prescription: Initial Exercise Prescription - 01/28/18 1000    Date of Initial Exercise RX and Referring Provider          Date  01/28/18    Referring Provider  Shelva Majestic MD        Treadmill          MPH  2.8    Grade  0    Minutes  10    METs  3.14        Bike          Level  0.5    Minutes  10    METs  2.3        NuStep          Level  3    SPM  80    Minutes  10    METs  2.5        Prescription Details          Frequency (times per week)  3    Duration  Progress to 45 minutes of aerobic exercise without signs/symptoms of physical distress        Intensity          THRR 40-80% of Max Heartrate  60-119    Ratings of Perceived Exertion  11-13    Perceived Dyspnea  0-4        Progression          Progression  Continue to progress workloads to maintain intensity without signs/symptoms of physical distress.        Resistance Training          Training Prescription  Yes    Reps  10-15  Perform Capillary Blood Glucose checks as needed.  Exercise Prescription Changes: Exercise Prescription Changes    Response to Exercise    Row Name 01/28/18 1000 02/01/18 1000   Blood Pressure (Admit)  no documentation  112/66   Blood Pressure (Exercise)  no documentation  134/82   Blood Pressure (Exit)  no documentation  102/62   Heart Rate (Admit)  no documentation  73 bpm   Heart Rate (Exercise)  no  documentation  100 bpm   Heart Rate (Exit)  no documentation  73 bpm   Symptoms  no documentation  none   Comments  no documentation  pt oriented to exercise equipment   Duration  no documentation  Continue with 30 min of aerobic exercise without signs/symptoms of physical distress.   Intensity  no documentation  THRR unchanged       Progression    Row Name 01/28/18 1000 02/01/18 1000   Progression  no documentation  Continue to progress workloads to maintain intensity without signs/symptoms of physical distress.   Average METs  no documentation  2.5       Resistance Training    Row Name 01/28/18 1000 02/01/18 1000   Training Prescription  no documentation  Yes   Weight  3lbs  3lbs   Reps  no documentation  10-15   Time  no documentation  10 Minutes       Treadmill    Row Name 01/28/18 1000 02/01/18 1000   MPH  no documentation  2.8   Grade  no documentation  0   Minutes  no documentation  10   METs  no documentation  3.14       [place] Name 01/28/18 1000 02/01/18 1000   Level  no documentation  0.5   Minutes  no documentation  10   METs  no documentation  2.3       NuStep    Row Name 01/28/18 1000 02/01/18 1000   Level  no documentation  3   SPM  no documentation  80   Minutes  no documentation  10   METs  no documentation  2          Exercise Comments: Exercise Comments    Row Name 02/01/18 1045 02/09/18 1417   Exercise Comments  Pt was oriented to exercise equipment today. Pt responded well to first exercise session and was able to exercise without signs/symptoms of physical distress.  Reviewed current MET's and activity levels. Pt is progressing well in cardiac rehab. Will continue to monitor activity levels.      Exercise Goals and Review: Exercise Goals    Exercise Goals    Row Name 01/28/18 0914   Increase Physical Activity  Yes   Intervention  Provide advice, education, support and counseling about physical activity/exercise needs.;Develop an  individualized exercise prescription for aerobic and resistive training based on initial evaluation findings, risk stratification, comorbidities and participant's personal goals.   Expected Outcomes  Short Term: Attend rehab on a regular basis to increase amount of physical activity.;Long Term: Exercising regularly at least 3-5 days a week.;Long Term: Add in home exercise to make exercise part of routine and to increase amount of physical activity.   Increase Strength and Stamina  Yes return to playing golf   Intervention  Provide advice, education, support and counseling about physical activity/exercise needs.;Develop an individualized exercise prescription for aerobic and resistive training based on initial evaluation findings, risk stratification, comorbidities and participant's personal  goals.   Expected Outcomes  Short Term: Increase workloads from initial exercise prescription for resistance, speed, and METs.;Short Term: Perform resistance training exercises routinely during rehab and add in resistance training at home;Long Term: Improve cardiorespiratory fitness, muscular endurance and strength as measured by increased METs and functional capacity (6MWT)   Able to understand and use rate of perceived exertion (RPE) scale  Yes   Intervention  Provide education and explanation on how to use RPE scale   Expected Outcomes  Short Term: Able to use RPE daily in rehab to express subjective intensity level;Long Term:  Able to use RPE to guide intensity level when exercising independently   Knowledge and understanding of Target Heart Rate Range (THRR)  Yes   Intervention  Provide education and explanation of THRR including how the numbers were predicted and where they are located for reference   Expected Outcomes  Short Term: Able to state/look up THRR;Long Term: Able to use THRR to govern intensity when exercising independently;Short Term: Able to use daily as guideline for intensity in rehab   Able to  check pulse independently  Yes   Intervention  Provide education and demonstration on how to check pulse in carotid and radial arteries.;Review the importance of being able to check your own pulse for safety during independent exercise   Expected Outcomes  Short Term: Able to explain why pulse checking is important during independent exercise;Long Term: Able to check pulse independently and accurately   Understanding of Exercise Prescription  Yes   Intervention  Provide education, explanation, and written materials on patient's individual exercise prescription   Expected Outcomes  Short Term: Able to explain program exercise prescription;Long Term: Able to explain home exercise prescription to exercise independently          Exercise Goals Re-Evaluation : Exercise Goals Re-Evaluation    Exercise Goal Re-Evaluation    Row Name 02/01/18 1049 02/09/18 1418   Exercise Goals Review  Increase Physical Activity;Able to understand and use rate of perceived exertion (RPE) scale  Increase Physical Activity;Able to understand and use rate of perceived exertion (RPE) scale;Knowledge and understanding of Target Heart Rate Range (THRR);Understanding of Exercise Prescription;Increase Strength and Stamina;Able to check pulse independently   Comments  Pt demonstrated proper use of RPE scale. Pt was oriented to exercise equipment and exercise for 30 minutes without CP, dizziness or SOB.  Pt is responding well to exercise program. Pt is also tolerating WL increases very well and  able to exercise for 30 minutes without difficulty.   Expected Outcomes  Pt will continue to exercise safely and without difficulty in cardiac rehab.  Pt will continue to exercise safely and without difficulty in cardiac rehab.           Discharge Exercise Prescription (Final Exercise Prescription Changes): Exercise Prescription Changes - 02/01/18 1000    Response to Exercise          Blood Pressure (Admit)  112/66    Blood  Pressure (Exercise)  134/82    Blood Pressure (Exit)  102/62    Heart Rate (Admit)  73 bpm    Heart Rate (Exercise)  100 bpm    Heart Rate (Exit)  73 bpm    Symptoms  none    Comments  pt oriented to exercise equipment    Duration  Continue with 30 min of aerobic exercise without signs/symptoms of physical distress.    Intensity  THRR unchanged        Progression  Progression  Continue to progress workloads to maintain intensity without signs/symptoms of physical distress.    Average METs  2.5        Resistance Training          Training Prescription  Yes    Weight  3lbs    Reps  10-15    Time  10 Minutes        Treadmill          MPH  2.8    Grade  0    Minutes  10    METs  3.14        Bike          Level  0.5    Minutes  10    METs  2.3        NuStep          Level  3    SPM  80    Minutes  10    METs  2           Nutrition:  Target Goals: Understanding of nutrition guidelines, daily intake of sodium <1576m, cholesterol <2014m calories 30% from fat and 7% or less from saturated fats, daily to have 5 or more servings of fruits and vegetables.  Biometrics: Pre Biometrics - 01/28/18 1035    Pre Biometrics          Height  6' (1.829 m)    Weight  162 lb 11.2 oz (73.8 kg)    Waist Circumference  34.75 inches    Hip Circumference  36.75 inches    Waist to Hip Ratio  0.95 %    BMI (Calculated)  22.06    Triceps Skinfold  10 mm    % Body Fat  21.2 %    Grip Strength  32 kg    Flexibility  11 in    Single Leg Stand  6.63 seconds            Nutrition Therapy Plan and Nutrition Goals: Nutrition Therapy & Goals - 01/28/18 1121    Nutrition Therapy          Diet  Heart Healthy        Personal Nutrition Goals          Nutrition Goal  1-2 lb wt gain/week to a wt gain goal of 6-10 lb at graduation from Cardiac Rehab. Goal wt of 168-170 lb desired.         Intervention Plan          Intervention  Prescribe, educate and counsel  regarding individualized specific dietary modifications aiming towards targeted core components such as weight, hypertension, lipid management, diabetes, heart failure and other comorbidities.    Expected Outcomes  Short Term Goal: Understand basic principles of dietary content, such as calories, fat, sodium, cholesterol and nutrients.;Long Term Goal: Adherence to prescribed nutrition plan.           Nutrition Assessments: Nutrition Assessments - 01/28/18 1121    MEDFICTS Scores          Pre Score  68           Nutrition Goals Re-Evaluation:   Nutrition Goals Re-Evaluation:   Nutrition Goals Discharge (Final Nutrition Goals Re-Evaluation):   Psychosocial: Target Goals: Acknowledge presence or absence of significant depression and/or stress, maximize coping skills, provide positive support system. Participant is able to verbalize types and ability to use techniques and skills needed for reducing stress and depression.  Initial Review & Psychosocial  Screening: Initial Psych Review & Screening - 01/28/18 1124    Initial Review          Current issues with  None Identified        Family Dynamics          Good Support System?  Yes Pt presented with wife to orientation and notes her to be a source of support.         Barriers          Psychosocial barriers to participate in program  There are no identifiable barriers or psychosocial needs.        Screening Interventions          Interventions  Encouraged to exercise           Quality of Life Scores: Quality of Life - 01/28/18 0850    Quality of Life Scores          Health/Function Pre  26.14 %    Socioeconomic Pre  29.14 %    Psych/Spiritual Pre  30 %    Family Pre  28.5 %    GLOBAL Pre  27.94 %          Scores of 19 and below usually indicate a poorer quality of life in these areas.  A difference of  2-3 points is a clinically meaningful difference.  A difference of 2-3 points in the total score of the  Quality of Life Index has been associated with significant improvement in overall quality of life, self-image, physical symptoms, and general health in studies assessing change in quality of life.  PHQ-9: Recent Review Flowsheet Data    Depression screen [facility] 2/9 02/01/2018   Decreased Interest 0   Down, Depressed, Hopeless 0   PHQ - 2 Score 0     Interpretation of Total Score  Total Score Depression Severity:  1-4 = Minimal depression, 5-9 = Mild depression, 10-14 = Moderate depression, 15-19 = Moderately severe depression, 20-27 = Severe depression   Psychosocial Evaluation and Intervention: Psychosocial Evaluation - 02/01/18 0916    Psychosocial Evaluation & Interventions          Interventions  Encouraged to exercise with the program and follow exercise prescription    Comments  no psychsocial needs identified, no interventions necessary. pt enjoys playing golf and is looking forward to MD approval to resume.      Expected Outcomes  pt will exhibit positive outlook with good coping skills.     Continue Psychosocial Services   No Follow up required           Psychosocial Re-Evaluation: Psychosocial Re-Evaluation    Psychosocial Re-Evaluation    Row Name 02/08/18 [phone]   Current issues with  None Identified   Comments  no psychosocial needs identified, no interventions necessary    Expected Outcomes  pt will exhibit positive outlook with good coping skills.    Interventions  Encouraged to attend Cardiac Rehabilitation for the exercise   Continue Psychosocial Services   No Follow up required          Psychosocial Discharge (Final Psychosocial Re-Evaluation): Psychosocial Re-Evaluation - 02/08/18 0739    Psychosocial Re-Evaluation          Current issues with  None Identified    Comments  no psychosocial needs identified, no interventions necessary     Expected Outcomes  pt will exhibit positive outlook with good coping skills.     Interventions  Encouraged to attend  Cardiac Rehabilitation for the  exercise    Continue Psychosocial Services   No Follow up required           Vocational Rehabilitation: Provide vocational rehab assistance to qualifying candidates.   Vocational Rehab Evaluation & Intervention: Vocational Rehab - 01/28/18 1115    Initial Vocational Rehab Evaluation & Intervention          Assessment shows need for Vocational Rehabilitation  No           Education: Education Goals: Education classes will be provided on a weekly basis, covering required topics. Participant will state understanding/return demonstration of topics presented.  Learning Barriers/Preferences: Learning Barriers/Preferences - 01/28/18 0913    Learning Barriers/Preferences          Learning Barriers  Sight    Learning Preferences  Skilled Demonstration;Video           Education Topics: Count Your Pulse:  -Group instruction provided by verbal instruction, demonstration, patient participation and written materials to support subject.  Instructors address importance of being able to find your pulse and how to count your pulse when at home without a heart monitor.  Patients get hands on experience counting their pulse with staff help and individually.   Heart Attack, Angina, and Risk Factor Modification:  -Group instruction provided by verbal instruction, video, and written materials to support subject.  Instructors address signs and symptoms of angina and heart attacks.    Also discuss risk factors for heart disease and how to make changes to improve heart health risk factors.   Functional Fitness:  -Group instruction provided by verbal instruction, demonstration, patient participation, and written materials to support subject.  Instructors address safety measures for doing things around the house.  Discuss how to get up and down off the floor, how to pick things up properly, how to safely get out of a chair without assistance, and balance  training.   Meditation and Mindfulness:  -Group instruction provided by verbal instruction, patient participation, and written materials to support subject.  Instructor addresses importance of mindfulness and meditation practice to help reduce stress and improve awareness.  Instructor also leads participants through a meditation exercise.    Stretching for Flexibility and Mobility:  -Group instruction provided by verbal instruction, patient participation, and written materials to support subject.  Instructors lead participants through series of stretches that are designed to increase flexibility thus improving mobility.  These stretches are additional exercise for major muscle groups that are typically performed during regular warm up and cool down.   Hands Only CPR:  -Group verbal, video, and participation provides a basic overview of AHA guidelines for community CPR. Role-play of emergencies allow participants the opportunity to practice calling for help and chest compression technique with discussion of AED use.   Hypertension: -Group verbal and written instruction that provides a basic overview of hypertension including the most recent diagnostic guidelines, risk factor reduction with self-care instructions and medication management.    Nutrition I class: Heart Healthy Eating:  -Group instruction provided by PowerPoint slides, verbal discussion, and written materials to support subject matter. The instructor gives an explanation and review of the Therapeutic Lifestyle Changes diet recommendations, which includes a discussion on lipid goals, dietary fat, sodium, fiber, plant stanol/sterol esters, sugar, and the components of a well-balanced, healthy diet. Flowsheet Row CARDIAC REHAB PHASE II EXERCISE from 02/03/2018 in [place]  Date  01/28/18  Educator  RD      Nutrition II class: Lifestyle Skills:  -Group instruction  provided by PowerPoint slides,  verbal discussion, and written materials to support subject matter. The instructor gives an explanation and review of label reading, grocery shopping for heart health, heart healthy recipe modifications, and ways to make healthier choices when eating out. Flowsheet Row CARDIAC REHAB PHASE II EXERCISE from 02/03/2018 in [place]  Date  01/28/18  Educator  RD      Diabetes Question & Answer:  -Group instruction provided by PowerPoint slides, verbal discussion, and written materials to support subject matter. The instructor gives an explanation and review of diabetes co-morbidities, pre- and post-prandial blood glucose goals, pre-exercise blood glucose goals, signs, symptoms, and treatment of hypoglycemia and hyperglycemia, and foot care basics.   Diabetes Blitz:  -Group instruction provided by PowerPoint slides, verbal discussion, and written materials to support subject matter. The instructor gives an explanation and review of the physiology behind type 1 and type 2 diabetes, diabetes medications and rational behind using different medications, pre- and post-prandial blood glucose recommendations and Hemoglobin A1c goals, diabetes diet, and exercise including blood glucose guidelines for exercising safely.    Portion Distortion:  -Group instruction provided by PowerPoint slides, verbal discussion, written materials, and food models to support subject matter. The instructor gives an explanation of serving size versus portion size, changes in portions sizes over the last 20 years, and what consists of a serving from each food group.   Stress Management:  -Group instruction provided by verbal instruction, video, and written materials to support subject matter.  Instructors review role of stress in heart disease and how to cope with stress positively.     Exercising on Your Own:  -Group instruction provided by verbal instruction, power point, and written materials  to support subject.  Instructors discuss benefits of exercise, components of exercise, frequency and intensity of exercise, and end points for exercise.  Also discuss use of nitroglycerin and activating EMS.  Review options of places to exercise outside of rehab.  Review guidelines for sex with heart disease.   Cardiac Drugs I:  -Group instruction provided by verbal instruction and written materials to support subject.  Instructor reviews cardiac drug classes: antiplatelets, anticoagulants, beta blockers, and statins.  Instructor discusses reasons, side effects, and lifestyle considerations for each drug class.   Cardiac Drugs II:  -Group instruction provided by verbal instruction and written materials to support subject.  Instructor reviews cardiac drug classes: angiotensin converting enzyme inhibitors (ACE-I), angiotensin II receptor blockers (ARBs), nitrates, and calcium channel blockers.  Instructor discusses reasons, side effects, and lifestyle considerations for each drug class.   Anatomy and Physiology of the Circulatory System:  Group verbal and written instruction and models provide basic cardiac anatomy and physiology, with the coronary electrical and arterial systems. Review of: AMI, Angina, Valve disease, Heart Failure, Peripheral Artery Disease, Cardiac Arrhythmia, Pacemakers, and the ICD. Flowsheet Row CARDIAC REHAB PHASE II EXERCISE from 02/03/2018 in [place]  Date  02/03/18  Educator  RN  Instruction Review Code  2- Demonstrated Understanding      Other Education:  -Group or individual verbal, written, or video instructions that support the educational goals of the cardiac rehab program.   Holiday Eating Survival Tips:  -Group instruction provided by PowerPoint slides, verbal discussion, and written materials to support subject matter. The instructor gives patients tips, tricks, and techniques to help them not only survive but enjoy the  holidays despite the onslaught of food that accompanies the holidays.   Knowledge  Questionnaire Score: Knowledge Questionnaire Score - 01/28/18 0848    Knowledge Questionnaire Score          Pre Score  20/24           Core Components/Risk Factors/Patient Goals at Admission: Personal Goals and Risk Factors at Admission - 01/28/18 1037    Core Components/Risk Factors/Patient Goals on Admission          Lipids  Yes    Intervention  Provide education and support for participant on nutrition & aerobic/resistive exercise along with prescribed medications to achieve LDL <58m, HDL >442m    Expected Outcomes  Short Term: Participant states understanding of desired cholesterol values and is compliant with medications prescribed. Participant is following exercise prescription and nutrition guidelines.;Long Term: Cholesterol controlled with medications as prescribed, with individualized exercise RX and with personalized nutrition plan. Value goals: LDL < 7038mHDL > 40 mg.           Core Components/Risk Factors/Patient Goals Review:  Goals and Risk Factor Review    Core Components/Risk Factors/Patient Goals Review    Row Name 02/01/18 0914 02/08/18 0739   Personal Goals Review  Lipids  Lipids   Review  pt with elevated lipids demonstrates willingness to participate in CR opportunities. pt personal goal is to resume playing golf. pt encouraged to participate in exercise, nutrition and lifestyle modification education opportunites.   pt with elevated lipids demonstrates willingness to participate in CR opportunities. pt personal goal is to resume playing golf. pt encouraged to participate in exercise, nutrition and lifestyle modification education opportunites.    Expected Outcomes  pt will participate in CR exercise, nutrition and lifestyle modification opportunities.   pt will participate in CR exercise, nutrition and lifestyle modification opportunities.           Core Components/Risk  Factors/Patient Goals at Discharge (Final Review):  Goals and Risk Factor Review - 02/08/18 0739    Core Components/Risk Factors/Patient Goals Review          Personal Goals Review  Lipids    Review  pt with elevated lipids demonstrates willingness to participate in CR opportunities. pt personal goal is to resume playing golf. pt encouraged to participate in exercise, nutrition and lifestyle modification education opportunites.     Expected Outcomes  pt will participate in CR exercise, nutrition and lifestyle modification opportunities.            ITP Comments: ITP Comments    Row Name 01/28/18 0911 02/01/18 0913 02/11/18 1010   ITP Comments  Dr. TraFransico Himedical Director  pt started group exercise. pt tolerated light activity without difficulty. pt oriented to exercise equipment and safety routine.    30 day ITP review.  pt demonstrates willingness to participate in group exercise.  pt with good attendance and participation.      Comments:

## 2018-02-12 ENCOUNTER — Encounter (HOSPITAL_COMMUNITY): Payer: PPO

## 2018-02-15 ENCOUNTER — Encounter (HOSPITAL_COMMUNITY): Payer: PPO

## 2018-02-15 ENCOUNTER — Encounter (HOSPITAL_COMMUNITY)
Admission: RE | Admit: 2018-02-15 | Discharge: 2018-02-15 | Disposition: A | Discharge status: Home / Self Care | Payer: PPO | Source: Ambulatory Visit | Attending: Cardiovascular Disease | Admitting: Cardiovascular Disease

## 2018-02-15 DIAGNOSIS — Z48812 Encounter for surgical aftercare following surgery on the circulatory system: Secondary | ICD-10-CM | POA: Diagnosis not present

## 2018-02-15 DIAGNOSIS — Z951 Presence of aortocoronary bypass graft: Secondary | ICD-10-CM

## 2018-02-15 DIAGNOSIS — B029 Zoster without complications: Secondary | ICD-10-CM | POA: Diagnosis not present

## 2018-02-15 NOTE — Progress Notes (Signed)
Dominion Yetta Barre 72 y.o. male Nutrition Note Spoke with pt. Nutrition Plan and Nutrition Survey goals reviewed with pt. Pt is following a Heart Healthy diet. Pt wants to gain wt. Pt has been trying to gain wt by: eating regularly across the day, 3 meals and has added in a snack per dietitians recommendation, pt has additionally increased calories from healthy fats such as olive oil, and avocado's at meals. Wt gain tips reviewed. Pt expressed understanding of the information reviewed. Pt aware of nutrition education classes offered and he is unable to attend nutrition classes.   Lab Results  Component Value Date   HGBA1C 5.6 11/11/2017   Wt Readings from Last 3 Encounters:  02/01/18 162 lb 6.4 oz (73.7 kg)  01/28/18 162 lb 11.2 oz (73.8 kg)  12/22/17 164 lb 3.2 oz (74.5 kg)    Nutrition Diagnosis ? Food-and nutrition-related knowledge deficit related to lack of exposure to information as related to diagnosis of: ? CVD   Unintentional wt loss related to recent surgery and heart healthy dietary changes as evidenced by wt loss of 5.9% over the past 2 months.   Nutrition Intervention          Pt's individual nutrition plan reviewed with pt.  Goal(s) ?  Pt to identify food quantities necessary to achieve weight gain of 6-10 lb at graduation from cardiac rehab. Goal wt of 168-170 lb desired.      Laurina Bustle, MS, RD, LDN  02/15/2018 9:21 AM

## 2018-02-15 NOTE — Progress Notes (Signed)
Daily Session Note  Patient Details  Name: Edwin Morris MRN: [id number] Date of Birth: Jul 01, 1946 Referring Provider:   Flowsheet Row CARDIAC REHAB PHASE II ORIENTATION from 01/28/2018 in [place]  Referring Provider  Shelva Majestic MD      Encounter Date: 02/15/2018  Check In: Session Check In - 02/15/18 0858    Check-In          Location  [facility]    Staff Present  Andi Hence, RN, BSN;Amber Fair, MS, ACSM RCEP, Exercise Physiologist;Molly DiVincenzo, [place], ACSM RCEP, Exercise Physiologist;Tyara Carol Ada, MS,ACSM CEP, Exercise Physiologist    Supervising physician immediately available to respond to emergencies  [facility] immediately available    Physician(s)  Dr. Broadus John    Medication changes reported      No    Fall or balance concerns reported     No    Tobacco Cessation  No Change    Warm-up and Cool-down  Performed as group-led instruction    Resistance Training Performed  Yes    VAD Patient?  No        Pain Assessment          Currently in Pain?  No/denies    Multiple Pain Sites  No           Capillary Blood Glucose: No results found for this or any previous visit (from the past 24 hour(s)).    Social History   Tobacco Use  Smoking Status Never Smoker  Smokeless Tobacco Never Used    Goals Met:  Exercise tolerated well  Goals Unmet:  Not Applicable  Comments: pt reported post exercise painful rash on back and trunk.  Pt reports symptoms increased over the weekend. Red papular rash noted on back, with scabs and blisters.  No oozing noted.   Exercise stopped.  Pt instructed to notify PCP for evaluation today for possible shingles virus. Pt verbalized understanding.  Andi Hence, RN, BSN Cardiac Pulmonary Rehab   Dr. Fransico Him is Medical Director for Cardiac Rehab at [facility].

## 2018-02-16 ENCOUNTER — Telehealth (HOSPITAL_COMMUNITY): Payer: Self-pay | Admitting: *Deleted

## 2018-02-16 NOTE — Telephone Encounter (Signed)
Pt given echo results 

## 2018-02-17 ENCOUNTER — Encounter (HOSPITAL_COMMUNITY): Payer: PPO

## 2018-02-19 ENCOUNTER — Encounter (HOSPITAL_COMMUNITY): Payer: PPO

## 2018-02-22 ENCOUNTER — Encounter (HOSPITAL_COMMUNITY): Payer: PPO

## 2018-02-22 ENCOUNTER — Encounter (HOSPITAL_COMMUNITY)
Admission: RE | Admit: 2018-02-22 | Discharge: 2018-02-22 | Disposition: A | Discharge status: Home / Self Care | Payer: PPO | Source: Ambulatory Visit | Attending: Cardiovascular Disease | Admitting: Cardiovascular Disease

## 2018-02-22 DIAGNOSIS — Z951 Presence of aortocoronary bypass graft: Secondary | ICD-10-CM

## 2018-02-22 DIAGNOSIS — Z48812 Encounter for surgical aftercare following surgery on the circulatory system: Secondary | ICD-10-CM | POA: Diagnosis not present

## 2018-02-22 NOTE — Progress Notes (Signed)
Daily Session Note  Patient Details  Name: Codee Kadyn Guild MRN: [id number] Date of Birth: 1945/09/24 Referring Provider:   Flowsheet Row CARDIAC REHAB PHASE II ORIENTATION from 01/28/2018 in [place]  Referring Provider  Shelva Majestic MD      Encounter Date: 02/22/2018  Check In: Session Check In - 02/22/18 0848    Check-In          Location  [facility]    Staff Present  Andi Hence, RN, BSN;Amber Fair, MS, ACSM RCEP, Exercise Physiologist;Tyara Carol Ada, MS,ACSM CEP, Exercise Physiologist;Other    Supervising physician immediately available to respond to emergencies  [facility] immediately available    Physician(s)  Dr. Eliseo Squires     Medication changes reported      No    Fall or balance concerns reported     No    Tobacco Cessation  No Change    Warm-up and Cool-down  Performed as group-led instruction    Resistance Training Performed  Yes    VAD Patient?  No        Pain Assessment          Currently in Pain?  No/denies    Multiple Pain Sites  No           Capillary Blood Glucose: No results found for this or any previous visit (from the past 24 hour(s)).    Social History   Tobacco Use  Smoking Status Never Smoker  Smokeless Tobacco Never Used    Goals Met:  Exercise tolerated well  Goals Unmet:  Not Applicable  Comments: pt returned to cardiac rehab after absence for shingles rash with clearance from his PCP. Pt rash has resolved.  No blisters present. Pt reports symptom relief with valtrex per PCP direction. Pt tolerated exercise well.     Dr. Fransico Him is Medical Director for Cardiac Rehab at [facility].

## 2018-02-22 NOTE — Progress Notes (Signed)
Reviewed home exercise with pt today.  Pt [facility] walk [facility] Reviewed THR, pulse, RPE, sign and symptoms, and when to call 911 or MD.  Also discussed weather considerations and indoor options.  Pt voiced understanding.   Edwin Morris [employer]

## 2018-02-24 ENCOUNTER — Encounter (HOSPITAL_COMMUNITY): Payer: PPO

## 2018-02-24 ENCOUNTER — Encounter (HOSPITAL_COMMUNITY)
Admission: RE | Admit: 2018-02-24 | Discharge: 2018-02-24 | Disposition: A | Discharge status: Home / Self Care | Payer: PPO | Source: Ambulatory Visit | Attending: Cardiovascular Disease | Admitting: Cardiovascular Disease

## 2018-02-24 DIAGNOSIS — Z48812 Encounter for surgical aftercare following surgery on the circulatory system: Secondary | ICD-10-CM | POA: Diagnosis not present

## 2018-02-26 ENCOUNTER — Encounter (HOSPITAL_COMMUNITY): Payer: PPO

## 2018-02-26 ENCOUNTER — Encounter (HOSPITAL_COMMUNITY)
Admission: RE | Admit: 2018-02-26 | Discharge: 2018-02-26 | Disposition: A | Discharge status: Home / Self Care | Payer: PPO | Source: Ambulatory Visit | Attending: Cardiovascular Disease | Admitting: Cardiovascular Disease

## 2018-02-26 DIAGNOSIS — Z951 Presence of aortocoronary bypass graft: Secondary | ICD-10-CM

## 2018-02-26 DIAGNOSIS — Z48812 Encounter for surgical aftercare following surgery on the circulatory system: Secondary | ICD-10-CM | POA: Diagnosis not present

## 2018-03-01 ENCOUNTER — Encounter (HOSPITAL_COMMUNITY): Payer: PPO

## 2018-03-01 ENCOUNTER — Encounter (HOSPITAL_COMMUNITY): Payer: Self-pay

## 2018-03-01 ENCOUNTER — Other Ambulatory Visit: Payer: Self-pay | Admitting: Cardiovascular Disease

## 2018-03-01 ENCOUNTER — Encounter (HOSPITAL_COMMUNITY)
Admission: RE | Admit: 2018-03-01 | Discharge: 2018-03-01 | Disposition: A | Discharge status: Home / Self Care | Payer: PPO | Source: Ambulatory Visit | Attending: Cardiovascular Disease | Admitting: Cardiovascular Disease

## 2018-03-01 DIAGNOSIS — Z79899 Other long term (current) drug therapy: Secondary | ICD-10-CM | POA: Diagnosis not present

## 2018-03-01 DIAGNOSIS — Z7982 Long term (current) use of aspirin: Secondary | ICD-10-CM | POA: Insufficient documentation

## 2018-03-01 DIAGNOSIS — Z951 Presence of aortocoronary bypass graft: Secondary | ICD-10-CM | POA: Diagnosis not present

## 2018-03-01 DIAGNOSIS — Z8572 Personal history of non-Hodgkin lymphomas: Secondary | ICD-10-CM | POA: Insufficient documentation

## 2018-03-01 DIAGNOSIS — I213 ST elevation (STEMI) myocardial infarction of unspecified site: Secondary | ICD-10-CM | POA: Insufficient documentation

## 2018-03-01 DIAGNOSIS — Z48812 Encounter for surgical aftercare following surgery on the circulatory system: Secondary | ICD-10-CM | POA: Insufficient documentation

## 2018-03-01 DIAGNOSIS — Z7902 Long term (current) use of antithrombotics/antiplatelets: Secondary | ICD-10-CM | POA: Diagnosis not present

## 2018-03-01 DIAGNOSIS — I469 Cardiac arrest, cause unspecified: Secondary | ICD-10-CM

## 2018-03-01 MED ORDER — CLOPIDOGREL BISULFATE 75 MG PO TABS: 75.0000 mg | ORAL_TABLET | Freq: Every day | ORAL | 0 refills | Status: DC

## 2018-03-01 NOTE — Telephone Encounter (Signed)
°*  STAT* If patient is at the pharmacy, call can be transferred to refill team.   1. Which medications need to be refilled? (please list name of each medication and dose if known) Clopidogrel  2. Which pharmacy/location (including street and city if local pharmacy) is medication to be sent to?[facility] on [address],[place]  3. Do they need a 30 day or 90 day supply? 90 and refills

## 2018-03-03 ENCOUNTER — Encounter (HOSPITAL_COMMUNITY): Payer: PPO

## 2018-03-05 ENCOUNTER — Encounter (HOSPITAL_COMMUNITY): Payer: PPO

## 2018-03-08 ENCOUNTER — Encounter (HOSPITAL_COMMUNITY): Payer: PPO

## 2018-03-10 ENCOUNTER — Encounter (HOSPITAL_COMMUNITY): Payer: PPO

## 2018-03-11 NOTE — Progress Notes (Signed)
Discharge Progress Report  Patient Details  Name: Edwin Morris MRN: [id number] Date of Birth: 19-Aug-1946 Referring Provider:   Flowsheet Row CARDIAC REHAB PHASE II ORIENTATION from 01/28/2018 in [place]  Referring Provider  Shelva Majestic MD       Number of Visits: 11   Reason for Discharge:  Patient independent in their exercise.  Smoking History:  Social History   Tobacco Use  Smoking Status Never Smoker  Smokeless Tobacco Never Used    Diagnosis:  S/P CABG x 5  ADL UCSD:   Initial Exercise Prescription: Initial Exercise Prescription - 01/28/18 1000    Date of Initial Exercise RX and Referring Provider          Date  01/28/18    Referring Provider  Shelva Majestic MD        Treadmill          MPH  2.8    Grade  0    Minutes  10    METs  3.14        Bike          Level  0.5    Minutes  10    METs  2.3        NuStep          Level  3    SPM  80    Minutes  10    METs  2.5        Prescription Details          Frequency (times per week)  3    Duration  Progress to 45 minutes of aerobic exercise without signs/symptoms of physical distress        Intensity          THRR 40-80% of Max Heartrate  60-119    Ratings of Perceived Exertion  11-13    Perceived Dyspnea  0-4        Progression          Progression  Continue to progress workloads to maintain intensity without signs/symptoms of physical distress.        Resistance Training          Training Prescription  Yes    Reps  10-15           Discharge Exercise Prescription (Final Exercise Prescription Changes): Exercise Prescription Changes - 03/01/18 1435    Response to Exercise          Blood Pressure (Admit)  100/62    Blood Pressure (Exercise)  146/78    Blood Pressure (Exit)  96/62    Heart Rate (Admit)  84 bpm    Heart Rate (Exercise)  119 bpm    Heart Rate (Exit)  76 bpm    Symptoms  none    Duration  Continue with 30 min of aerobic  exercise without signs/symptoms of physical distress.    Intensity  THRR unchanged        Progression          Progression  Continue to progress workloads to maintain intensity without signs/symptoms of physical distress.    Average METs  3.5        Resistance Training          Training Prescription  Yes    Weight  4lbs    Reps  10-15    Time  10 Minutes        Treadmill  MPH  3    Grade  2    Minutes  10    METs  4.12        Bike          Level  0.5    Minutes  10    METs  2.3        NuStep          Level  4    SPM  100    Minutes  10    METs  4.2        Home Exercise Plan          Plans to continue exercise at  Home (comment)    Frequency  Add 2 additional days to program exercise sessions.    Initial Home Exercises Provided  02/22/18           Functional Capacity: 6 Minute Walk    6 Minute Walk    Row Name 01/28/18 1022 01/28/18 1040 02/24/18 1216   Phase  Initial  no documentation  Discharge   Distance  1520 feet  no documentation  1800 feet   Walk Time  6 minutes  no documentation  6 minutes   # of Rest Breaks  0  no documentation  0   MPH  2.9  no documentation  3.4   METS  3.4  no documentation  3.9   RPE  11  no documentation  11   VO2 Peak  no documentation  no documentation  13.76   Symptoms  no documentation  no documentation  No   Resting HR  51 bpm  no documentation  70 bpm   Resting BP  122/70  no documentation  100/72   Resting Oxygen Saturation   98 %  no documentation  no documentation   Exercise Oxygen Saturation  during 6 min walk  99 %  no documentation  no documentation   Max Ex. HR  73 bpm  no documentation  83 bpm   Max Ex. BP  126/78  no documentation  116/62   2 Minute Post BP  no documentation  110/62  112/62          Psychological, QOL, Others - Outcomes: PHQ 2/9: Depression screen [facility] 2/9 03/01/2018 02/01/2018  Decreased Interest 0 0  Down, Depressed, Hopeless 0 0  PHQ - 2 Score 0 0    Quality of  Life: Quality of Life - 02/26/18 1203    Quality of Life          Select  Quality of Life        Quality of Life Scores          Health/Function Pre  26.14 %    Health/Function Post  27.6 %    Health/Function % Change  5.59 %    Socioeconomic Pre  29.14 %    Socioeconomic Post  27 %    Socioeconomic % Change   -7.34 %    Psych/Spiritual Pre  30 %    Psych/Spiritual Post  28.29 %    Psych/Spiritual % Change  -5.7 %    Family Pre  28.5 %    Family Post  27 %    Family % Change  -5.26 %    GLOBAL Pre  27.94 %    GLOBAL Post  27.56 %    GLOBAL % Change  -1.36 %           Personal Goals: Goals established  at orientation with interventions provided to work toward goal. Personal Goals and Risk Factors at Admission - 01/28/18 1037    Core Components/Risk Factors/Patient Goals on Admission          Lipids  Yes    Intervention  Provide education and support for participant on nutrition & aerobic/resistive exercise along with prescribed medications to achieve LDL 70mg , HDL >40mg .    Expected Outcomes  Short Term: Participant states understanding of desired cholesterol values and is compliant with medications prescribed. Participant is following exercise prescription and nutrition guidelines.;Long Term: Cholesterol controlled with medications as prescribed, with individualized exercise RX and with personalized nutrition plan. Value goals: LDL < 70mg , HDL > 40 mg.            Personal Goals Discharge: Goals and Risk Factor Review    Core Components/Risk Factors/Patient Goals Review    Row Name 02/01/18 0914 02/08/18 0739 03/01/18 1020   Personal Goals Review  Lipids  Lipids  Lipids   Review  pt with elevated lipids demonstrates willingness to participate in CR opportunities. pt personal goal is to resume playing golf. pt encouraged to participate in exercise, nutrition and lifestyle modification education opportunites.   pt with elevated lipids demonstrates willingness to  participate in CR opportunities. pt personal goal is to resume playing golf. pt encouraged to participate in exercise, nutrition and lifestyle modification education opportunites.   pt with elevated lipids demonstrates willingness to participate in CR opportunities. pt personal goal is to resume playing golf.    Expected Outcomes  pt will participate in CR exercise, nutrition and lifestyle modification opportunities.   pt will participate in CR exercise, nutrition and lifestyle modification opportunities.   pt will participate in CR exercise, nutrition and lifestyle modification opportunities.           Exercise Goals and Review: Exercise Goals    Exercise Goals    Row Name 01/28/18 0914   Increase Physical Activity  Yes   Intervention  Provide advice, education, support and counseling about physical activity/exercise needs.;Develop an individualized exercise prescription for aerobic and resistive training based on initial evaluation findings, risk stratification, comorbidities and participant's personal goals.   Expected Outcomes  Short Term: Attend rehab on a regular basis to increase amount of physical activity.;Long Term: Exercising regularly at least 3-5 days a week.;Long Term: Add in home exercise to make exercise part of routine and to increase amount of physical activity.   Increase Strength and Stamina  Yes return to playing golf   Intervention  Provide advice, education, support and counseling about physical activity/exercise needs.;Develop an individualized exercise prescription for aerobic and resistive training based on initial evaluation findings, risk stratification, comorbidities and participant's personal goals.   Expected Outcomes  Short Term: Increase workloads from initial exercise prescription for resistance, speed, and METs.;Short Term: Perform resistance training exercises routinely during rehab and add in resistance training at home;Long Term: Improve cardiorespiratory  fitness, muscular endurance and strength as measured by increased METs and functional capacity (6MWT)   Able to understand and use rate of perceived exertion (RPE) scale  Yes   Intervention  Provide education and explanation on how to use RPE scale   Expected Outcomes  Short Term: Able to use RPE daily in rehab to express subjective intensity level;Long Term:  Able to use RPE to guide intensity level when exercising independently   Knowledge and understanding of Target Heart Rate Range (THRR)  Yes   Intervention  Provide education and explanation of THRR  including how the numbers were predicted and where they are located for reference   Expected Outcomes  Short Term: Able to state/look up THRR;Long Term: Able to use THRR to govern intensity when exercising independently;Short Term: Able to use daily as guideline for intensity in rehab   Able to check pulse independently  Yes   Intervention  Provide education and demonstration on how to check pulse in carotid and radial arteries.;Review the importance of being able to check your own pulse for safety during independent exercise   Expected Outcomes  Short Term: Able to explain why pulse checking is important during independent exercise;Long Term: Able to check pulse independently and accurately   Understanding of Exercise Prescription  Yes   Intervention  Provide education, explanation, and written materials on patient's individual exercise prescription   Expected Outcomes  Short Term: Able to explain program exercise prescription;Long Term: Able to explain home exercise prescription to exercise independently          Nutrition & Weight - Outcomes: Pre Biometrics - 01/28/18 1035    Pre Biometrics          Height  6' (1.829 m)    Weight  162 lb 11.2 oz (73.8 kg)    Waist Circumference  34.75 inches    Hip Circumference  36.75 inches    Waist to Hip Ratio  0.95 %    BMI (Calculated)  22.06    Triceps Skinfold  10 mm    % Body Fat  21.2 %     Grip Strength  32 kg    Flexibility  11 in    Single Leg Stand  6.63 seconds          Post Biometrics - 02/24/18 1215     Post  Biometrics          Height  6' (1.829 m)    Weight  159 lb 9.8 oz (72.4 kg)    Waist Circumference  34.75 inches    Hip Circumference  36.5 inches    Waist to Hip Ratio  0.95 %    BMI (Calculated)  21.64    Triceps Skinfold  10 mm    % Body Fat  21.1 %    Grip Strength  37 kg    Flexibility  8.5 in    Single Leg Stand  29.88 seconds           Nutrition: Nutrition Therapy & Goals - 01/28/18 1121    Nutrition Therapy          Diet  Heart Healthy        Personal Nutrition Goals          Nutrition Goal  1-2 lb wt gain/week to a wt gain goal of 6-10 lb at graduation from Cardiac Rehab. Goal wt of 168-170 lb desired.         Intervention Plan          Intervention  Prescribe, educate and counsel regarding individualized specific dietary modifications aiming towards targeted core components such as weight, hypertension, lipid management, diabetes, heart failure and other comorbidities.    Expected Outcomes  Short Term Goal: Understand basic principles of dietary content, such as calories, fat, sodium, cholesterol and nutrients.;Long Term Goal: Adherence to prescribed nutrition plan.           Nutrition Discharge: Nutrition Assessments - 03/05/18 1021    MEDFICTS Scores          Pre Score  68  Post Score  53    Score Difference  -15           Education Questionnaire Score: Knowledge Questionnaire Score - 02/26/18 1203    Knowledge Questionnaire Score          Post Score  22/24           Goals reviewed with patient; copy given to patient.

## 2018-03-12 ENCOUNTER — Encounter (HOSPITAL_COMMUNITY): Payer: PPO

## 2018-03-12 ENCOUNTER — Encounter (HOSPITAL_COMMUNITY): Admission: RE | Admit: 2018-03-12 | Payer: PPO | Source: Ambulatory Visit

## 2018-03-15 ENCOUNTER — Encounter (HOSPITAL_COMMUNITY): Payer: PPO

## 2018-03-17 ENCOUNTER — Encounter (HOSPITAL_COMMUNITY): Payer: PPO

## 2018-03-19 ENCOUNTER — Encounter (HOSPITAL_COMMUNITY): Payer: PPO

## 2018-03-22 ENCOUNTER — Encounter (HOSPITAL_COMMUNITY): Payer: PPO

## 2018-03-24 ENCOUNTER — Encounter (HOSPITAL_COMMUNITY): Payer: PPO

## 2018-03-26 ENCOUNTER — Encounter (HOSPITAL_COMMUNITY): Payer: PPO

## 2018-03-29 ENCOUNTER — Encounter (HOSPITAL_COMMUNITY): Payer: PPO

## 2018-03-31 ENCOUNTER — Encounter (HOSPITAL_COMMUNITY): Payer: PPO

## 2018-04-02 ENCOUNTER — Encounter (HOSPITAL_COMMUNITY): Payer: PPO

## 2018-04-05 ENCOUNTER — Other Ambulatory Visit: Payer: Self-pay

## 2018-04-05 ENCOUNTER — Encounter (HOSPITAL_COMMUNITY): Payer: PPO

## 2018-04-05 ENCOUNTER — Ambulatory Visit (HOSPITAL_COMMUNITY)
Admission: RE | Admit: 2018-04-05 | Discharge: 2018-04-05 | Disposition: A | Discharge status: Home / Self Care | Payer: PPO | Source: Ambulatory Visit | Attending: Internal Medicine | Admitting: Internal Medicine

## 2018-04-05 ENCOUNTER — Encounter (HOSPITAL_COMMUNITY): Payer: Self-pay | Admitting: Internal Medicine

## 2018-04-05 VITALS — BP 100/70 | HR 71 | Wt 163.1 lb

## 2018-04-05 DIAGNOSIS — Z9221 Personal history of antineoplastic chemotherapy: Secondary | ICD-10-CM | POA: Insufficient documentation

## 2018-04-05 DIAGNOSIS — I2510 Atherosclerotic heart disease of native coronary artery without angina pectoris: Secondary | ICD-10-CM | POA: Insufficient documentation

## 2018-04-05 DIAGNOSIS — Z8572 Personal history of non-Hodgkin lymphomas: Secondary | ICD-10-CM | POA: Insufficient documentation

## 2018-04-05 DIAGNOSIS — Z79899 Other long term (current) drug therapy: Secondary | ICD-10-CM | POA: Insufficient documentation

## 2018-04-05 DIAGNOSIS — I4510 Unspecified right bundle-branch block: Secondary | ICD-10-CM | POA: Insufficient documentation

## 2018-04-05 DIAGNOSIS — Z7982 Long term (current) use of aspirin: Secondary | ICD-10-CM | POA: Diagnosis not present

## 2018-04-05 DIAGNOSIS — N400 Enlarged prostate without lower urinary tract symptoms: Secondary | ICD-10-CM | POA: Insufficient documentation

## 2018-04-05 DIAGNOSIS — Z951 Presence of aortocoronary bypass graft: Secondary | ICD-10-CM | POA: Insufficient documentation

## 2018-04-05 DIAGNOSIS — I5022 Chronic systolic (congestive) heart failure: Secondary | ICD-10-CM | POA: Diagnosis not present

## 2018-04-05 DIAGNOSIS — E785 Hyperlipidemia, unspecified: Secondary | ICD-10-CM | POA: Insufficient documentation

## 2018-04-05 DIAGNOSIS — E875 Hyperkalemia: Secondary | ICD-10-CM | POA: Diagnosis not present

## 2018-04-05 LAB — BASIC METABOLIC PANEL
ANION GAP: 9 (ref 5–15)
BUN: 12 mg/dL (ref 8–23)
CO2: 23 mmol/L (ref 22–32)
Calcium: 8.9 mg/dL (ref 8.9–10.3)
Chloride: 105 mmol/L (ref 98–111)
Creatinine, Ser: 0.63 mg/dL (ref 0.61–1.24)
GFR calc Af Amer: >60 mL/min (ref >60)
Glucose, Bld: 115 mg/dL — ABNORMAL HIGH (ref 70–99)
Potassium: 4.2 mmol/L (ref 3.5–5.1)
SODIUM: 137 mmol/L (ref 135–145)

## 2018-04-05 MED ORDER — LOSARTAN POTASSIUM 25 MG PO TABS: 25.0000 mg | ORAL_TABLET | Freq: Every day | ORAL | 11 refills | Status: DC

## 2018-04-05 MED ORDER — SPIRONOLACTONE 25 MG PO TABS: 12.5000 mg | ORAL_TABLET | Freq: Every day | ORAL | 5 refills | Status: DC

## 2018-04-05 NOTE — Addendum Note (Signed)
Encounter addended by: Darron Doom, RN on: 04/05/2018 11:06 AM  Actions taken: Order list changed, Diagnosis association updated, Sign clinical note

## 2018-04-05 NOTE — Patient Instructions (Signed)
Labs today (will call for abnormal results, otherwise no news is good news)  DECREASE spironolactone to 12.5 mg (0.5 Tablet) Once Daily at bedtime.  DECREASE Losartan to 25 mg (1 Tablet) Once Daily at bedtime.  Referral placed for you to see Dr. Harrell Gave in 6 months.  She's located in the [place] office.  Their office will contact you to schedule follow up.

## 2018-04-05 NOTE — Progress Notes (Signed)
Advanced Heart Failure Clinic Note   Referring Physician: PCP: Bernerd Limbo, MD PCP-Cardiologist: Shelva Majestic, MD   HPI:  Edwin Morris is a 72 y.o. male chronic systolic CHF, ICM, CAD s/p CABG, HLD, NHL, and BPH.   Diagnosed with NHL in 2009 and treated with chemotherapy (no XRT) by Dr. Julien Nordmann .  PRIOR THERAPY:  1. Status post 6 cycle of systemic chemotherapy with CHOP/Rituxan with Neulasta support. Last dose of chemotherapy was given March 27, 2009 and the patient refused to continue with the last 2 cycles of his treatment. 2. Maintenance Rituxan 375 mg per meter square given every 2 months. He is status post 3 cycles, again discontinued in July 2011.  Admitted 11/08/17 with chest tightness and pressure. During transport via EMS had VF arrest requiring defibrillation and transient CPR. ECG with ST elevation in III, aVF, V3, and V4 with RBBB. Intubated for airway protection. Taken emergently to cath which showed severe multivessel disease. CVTS consulted 11/09/17 and taken for CABG 11/13/17. HF team followed for med titration as tolerated. He had a relatively uncomplicated post op course. CR consulted.   Echo 02/04/2018: EF 50-55%   He presents today for follow up. He went to [facility] for the month of June but now stopped going and is back to work as Development worker, community. Stays active with work but is not exercising much. No CP, SOB or edema.  Review of systems complete and found to be negative unless listed in HPI.    Past Medical History:  Diagnosis Date  . BPH (benign prostatic hyperplasia)   . Hyperlipidemia   . NHL (non-Hodgkin's lymphoma) ([place])    nhl dx 10/2008    Current Outpatient Medications  Medication Sig Dispense Refill  . aspirin EC 81 MG EC tablet Take 1 tablet (81 mg total) by mouth daily. 30 tablet 0  . atorvastatin (LIPITOR) 80 MG tablet Take 1 tablet (80 mg total) by mouth daily at 6 PM. 30 tablet 5  . carvedilol (COREG) 3.125 MG tablet Take 1 tablet (3.125 mg total) by mouth  2 (two) times daily with a meal. 60 tablet 5  . cholecalciferol (VITAMIN D) 1000 UNITS tablet Take 1,000 Units by mouth daily.    . clopidogrel (PLAVIX) 75 MG tablet Take 1 tablet (75 mg total) by mouth daily. 30 tablet 0  . diphenhydrAMINE (BENADRYL ALLERGY) 25 mg capsule Take 25 mg by mouth at bedtime as needed for sleep.    . finasteride (PROSCAR) 5 MG tablet Take 5 mg by mouth daily.    . fish oil-omega-3 fatty acids 1000 MG capsule Take 2 g by mouth daily.    Marland Kitchen losartan (COZAAR) 25 MG tablet Take 1 tablet (25 mg total) by mouth 2 (two) times daily. 60 tablet 11  . spironolactone (ALDACTONE) 25 MG tablet Take 1 tablet (25 mg total) by mouth daily. 30 tablet 5   No current facility-administered medications for this encounter.     No Known Allergies    Social History   Socioeconomic History  . Marital status: Married    Spouse name: Not on file  . Number of children: Not on file  . Years of education: Not on file  . Highest education level: Not on file  Occupational History  . Not on file  Social Needs  . Financial resource strain: Not on file  . Food insecurity:    Worry: Not on file    Inability: Not on file  . Transportation needs:    Medical:  Not on file    Non-medical: Not on file  Tobacco Use  . Smoking status: Never Smoker  . Smokeless tobacco: Never Used  Substance and Sexual Activity  . Alcohol use: Not on file  . Drug use: Not on file  . Sexual activity: Not on file  Lifestyle  . Physical activity:    Days per week: Not on file    Minutes per session: Not on file  . Stress: Not on file  Relationships  . Social connections:    Talks on phone: Not on file    Gets together: Not on file    Attends religious service: Not on file    Active member of club or organization: Not on file    Attends meetings of clubs or organizations: Not on file    Relationship status: Not on file  . Intimate partner violence:    Fear of current or ex partner: Not on file     Emotionally abused: Not on file    Physically abused: Not on file    Forced sexual activity: Not on file  Other Topics Concern  . Not on file  Social History Narrative   ** Merged History Encounter **       Family History  Problem Relation Age of Onset  . Heart disease Maternal Uncle   . Heart disease Paternal Uncle      Vitals:   04/05/18 1014  BP: 100/70  Pulse: 71  SpO2: 98%  Weight: 163 lb 2 oz (74 kg)   Wt Readings from Last 3 Encounters:  04/05/18 163 lb 2 oz (74 kg)  02/24/18 159 lb 9.8 oz (72.4 kg)  02/01/18 162 lb 6.4 oz (73.7 kg)   PHYSICAL EXAM: General:  Well appearing. No resp difficulty HEENT: normal Neck: supple. no JVD. Carotids 2+ bilat; no bruits. No lymphadenopathy or thryomegaly appreciated. Cor: PMI nondisplaced. Regular rate & rhythm. No rubs, gallops or murmurs. Lungs: clear Abdomen: soft, nontender, nondistended. No hepatosplenomegaly. No bruits or masses. Good bowel sounds. Extremities: no cyanosis, clubbing, rash, edema Neuro: alert & orientedx3, cranial nerves grossly intact. moves all 4 extremities w/o difficulty. Affect pleasant   ASSESSMENT & PLAN:  1. Chronic systolic CHF. ICM.  - Echo 11/10/17 40-45%, Grade 1 DD, Trivial AS, Trivial MR, Mod LAE, Trivial TR, PA peak pressure 25 mm Hg. - s/p CABG 3/19 - Intra-op TEE 11/13/2017 EF 40-45%  - Echo 6/19 EF 50-55% Personally reviewed - Doing very well. - NYHA I-II symptoms - Volume status stable without diuretics.  - Decrease spiro to 12.5 mg qhs - Continue coreg 3.125 mg BID.  - BP soft will decrease losartan to 25 qhs - Check labs  2. CAD s/p CABG 11/13/17 - LHC 3/10/19with 60% ostial LAD, complex trifurcation lesion at prox LAD/SP/D1 of 85-90 and 95% and diffuse 85% in mid LAD, 70% D1, 60-80% ostial LCx, 40-50% OM in co-dominant LCx and 90% distal RCA and 60-70% mid RCA with EF 35%. - Continue ASA and statin and b-blocker - No s/s ischemia   3.Hyperlipidemia LDL goal <70 - LDL  91 11/10/17. -Onhigh dose statin -Hgb A1C 5.6. - Goal LDL < 70. Per PCP  4. Hyperkalemia - Stop Mrs. Dash. BMET today.   5. H/o Non-Hodgkins lymphoma 2009 - treated with chemo. No XRT - No change.   Doing very well. BP low. Med adjustments as above. Can f/u with [facility].   Glori Bickers, MD 04/05/18

## 2018-04-07 ENCOUNTER — Encounter (HOSPITAL_COMMUNITY): Payer: PPO

## 2018-04-09 ENCOUNTER — Encounter (HOSPITAL_COMMUNITY): Payer: PPO

## 2018-04-12 ENCOUNTER — Encounter (HOSPITAL_COMMUNITY): Payer: PPO

## 2018-04-14 ENCOUNTER — Encounter (HOSPITAL_COMMUNITY): Payer: PPO

## 2018-04-16 ENCOUNTER — Encounter (HOSPITAL_COMMUNITY): Payer: PPO

## 2018-04-19 ENCOUNTER — Encounter (HOSPITAL_COMMUNITY): Payer: PPO

## 2018-04-20 DIAGNOSIS — Z23 Encounter for immunization: Secondary | ICD-10-CM | POA: Diagnosis not present

## 2018-04-20 DIAGNOSIS — Z0000 Encounter for general adult medical examination without abnormal findings: Secondary | ICD-10-CM | POA: Diagnosis not present

## 2018-04-20 DIAGNOSIS — I5022 Chronic systolic (congestive) heart failure: Secondary | ICD-10-CM | POA: Diagnosis not present

## 2018-04-21 ENCOUNTER — Encounter (HOSPITAL_COMMUNITY): Payer: PPO

## 2018-04-23 ENCOUNTER — Encounter (HOSPITAL_COMMUNITY): Payer: PPO

## 2018-04-26 ENCOUNTER — Encounter (HOSPITAL_COMMUNITY): Payer: PPO

## 2018-04-28 ENCOUNTER — Encounter (HOSPITAL_COMMUNITY): Payer: PPO

## 2018-04-30 ENCOUNTER — Encounter (HOSPITAL_COMMUNITY): Payer: PPO

## 2018-05-05 ENCOUNTER — Encounter (HOSPITAL_COMMUNITY): Payer: PPO

## 2018-07-07 DIAGNOSIS — M25422 Effusion, left elbow: Secondary | ICD-10-CM | POA: Diagnosis not present

## 2018-07-07 DIAGNOSIS — M67432 Ganglion, left wrist: Secondary | ICD-10-CM | POA: Diagnosis not present

## 2018-08-04 ENCOUNTER — Ambulatory Visit: Payer: PPO | Admitting: Cardiology

## 2018-08-05 ENCOUNTER — Encounter (INDEPENDENT_AMBULATORY_CARE_PROVIDER_SITE_OTHER): Payer: Self-pay

## 2018-08-05 ENCOUNTER — Encounter: Payer: Self-pay | Admitting: Cardiology

## 2018-08-05 ENCOUNTER — Ambulatory Visit (INDEPENDENT_AMBULATORY_CARE_PROVIDER_SITE_OTHER): Payer: PPO | Admitting: Cardiology

## 2018-08-05 VITALS — BP 112/76 | HR 60 | Ht 72.0 in | Wt 165.0 lb

## 2018-08-05 DIAGNOSIS — I255 Ischemic cardiomyopathy: Secondary | ICD-10-CM | POA: Diagnosis not present

## 2018-08-05 DIAGNOSIS — I2510 Atherosclerotic heart disease of native coronary artery without angina pectoris: Secondary | ICD-10-CM | POA: Diagnosis not present

## 2018-08-05 DIAGNOSIS — Z7189 Other specified counseling: Secondary | ICD-10-CM

## 2018-08-05 DIAGNOSIS — Z79899 Other long term (current) drug therapy: Secondary | ICD-10-CM

## 2018-08-05 DIAGNOSIS — N62 Hypertrophy of breast: Secondary | ICD-10-CM | POA: Diagnosis not present

## 2018-08-05 DIAGNOSIS — E785 Hyperlipidemia, unspecified: Secondary | ICD-10-CM

## 2018-08-05 DIAGNOSIS — T50905A Adverse effect of unspecified drugs, medicaments and biological substances, initial encounter: Secondary | ICD-10-CM

## 2018-08-05 LAB — LIPID PANEL WITH LDL/HDL RATIO
Cholesterol, Total: 170 mg/dL (ref 100–199)
HDL: 41 mg/dL (ref >39)
LDL Calculated: 110 mg/dL — ABNORMAL HIGH (ref 0–99)
LDl/HDL Ratio: 2.7 ratio (ref 0.0–3.6)
TRIGLYCERIDES: 95 mg/dL (ref 0–149)
VLDL CHOLESTEROL CAL: 19 mg/dL (ref 5–40)

## 2018-08-05 LAB — BASIC METABOLIC PANEL
BUN / CREAT RATIO: 20 (ref 10–24)
BUN: 16 mg/dL (ref 8–27)
CO2: 21 mmol/L (ref 20–29)
Calcium: 9.8 mg/dL (ref 8.6–10.2)
Chloride: 102 mmol/L (ref 96–106)
Creatinine, Ser: 0.79 mg/dL (ref 0.76–1.27)
GFR calc Af Amer: 104 mL/min/{1.73_m2} (ref >59)
GFR, EST NON AFRICAN AMERICAN: 90 mL/min/{1.73_m2} (ref >59)
Glucose: 105 mg/dL — ABNORMAL HIGH (ref 65–99)
Potassium: 5.2 mmol/L (ref 3.5–5.2)
Sodium: 139 mmol/L (ref 134–144)

## 2018-08-05 MED ORDER — EPLERENONE 25 MG PO TABS: 12.5000 mg | ORAL_TABLET | Freq: Every day | ORAL | 11 refills | Status: DC
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
## 2018-08-05 NOTE — Patient Instructions (Signed)
Medication Instructions:  Stop: Spironolactone 25 mg Start: Eplerenone 12.5 mg daily  If you need a refill on your cardiac medications before your next appointment, please call your pharmacy.   Lab work: Your physician recommends that you return for lab work in today (Lipid, BMP)  If you have labs (blood work) drawn today and your tests are completely normal, you will receive your results only by: Marland Kitchen MyChart Message (if you have MyChart) OR . A paper copy in the mail If you have any lab test that is abnormal or we need to change your treatment, we will call you to review the results.  Testing/Procedures: None  Follow-Up: At [facility], you and your health needs are our priority.  As part of our continuing mission to provide you with exceptional heart care, we have created designated Provider Care Teams.  These Care Teams include your primary Cardiologist (physician) and Advanced Practice Providers (APPs -  Physician Assistants and Nurse Practitioners) who all work together to provide you with the care you need, when you need it. You will need a follow up appointment in 6 months.  Please call our office 2 months in advance to schedule this appointment.  You may see Dr. Harrell Gave or one of the following Advanced Practice Providers on your designated Care Team:   Rosaria Ferries, PA-C . Jory Sims, DNP, ANP

## 2018-08-06 ENCOUNTER — Other Ambulatory Visit: Payer: Self-pay

## 2018-08-08 ENCOUNTER — Encounter: Payer: Self-pay | Admitting: Cardiology

## 2018-08-08 DIAGNOSIS — T50905A Adverse effect of unspecified drugs, medicaments and biological substances, initial encounter: Secondary | ICD-10-CM

## 2018-08-08 DIAGNOSIS — N62 Hypertrophy of breast: Secondary | ICD-10-CM | POA: Insufficient documentation

## 2018-08-10 ENCOUNTER — Other Ambulatory Visit: Payer: Self-pay

## 2018-08-10 DIAGNOSIS — E785 Hyperlipidemia, unspecified: Secondary | ICD-10-CM

## 2018-08-11 ENCOUNTER — Other Ambulatory Visit: Payer: Self-pay | Admitting: *Deleted

## 2018-08-11 MED ORDER — CARVEDILOL 3.125 MG PO TABS: 3.1250 mg | ORAL_TABLET | Freq: Two times a day (BID) | ORAL | 5 refills | Status: DC

## 2018-08-12 ENCOUNTER — Other Ambulatory Visit: Payer: Self-pay

## 2018-08-12 ENCOUNTER — Ambulatory Visit (INDEPENDENT_AMBULATORY_CARE_PROVIDER_SITE_OTHER): Payer: PPO | Admitting: Pharmacist Clinician (PhC)/ Clinical Pharmacy Specialist

## 2018-08-12 DIAGNOSIS — E785 Hyperlipidemia, unspecified: Secondary | ICD-10-CM

## 2018-08-12 DIAGNOSIS — I2510 Atherosclerotic heart disease of native coronary artery without angina pectoris: Secondary | ICD-10-CM

## 2018-08-12 NOTE — Progress Notes (Signed)
08/18/2018 Edwin Morris Sep 11, 1945 [id number]   HPI:  Edwin Morris is a 72 y.o. male patient of Dr Harrell Gave, who presents today for a lipid clinic evaluation.  In addition to hyperlipidemia, his medical history is significant for a history of VF arrest, found to have STEMI, and s/p CABG x 5 (05/7025), chronic systolic heart failure and non-Hodgkin's lymphoma.  His chart also notes a history of drug induced gynecomastia.    He is currently taking atorvastatin 80 mg daily, with which he has no problems.  Unfortunately it has only dropped his LDL cholesterol to 110.  He has not tried ezetimibe, but with his LDL goal < 70, the addition would not get his LDL cholesterol low enough.  He is here today to discuss use of PCSK-9 inhibitors.    Current Medications: atorvastatin 80 mg qhs  Cholesterol Goals: LDL < 70   Intolerant/previously tried: none  Family history: brother with hyperlipidemia, sister without  Father died from [place] when pt was 12  Mother died from brain tumor, no history of heart disease  Diet:  Eats out 2 nights per week with some friends; no fast food; pt avoids fried foods, prefers broiled or grilled; eats plenty of salads and veggies (wife is Therapist, sports, so he "has to eat right")  Exercise:  Walks daily at [employer], has indoor "track" around gymnasium, about 30-40 minutes  Labs: 08/05/18:  TC 170, TG 95, HLD 41, LDL 110 (atorvastatin 80)   Current Outpatient Medications  Medication Sig Dispense Refill  . aspirin EC 81 MG EC tablet Take 1 tablet (81 mg total) by mouth daily. 30 tablet 0  . atorvastatin (LIPITOR) 80 MG tablet Take 1 tablet (80 mg total) by mouth daily at 6 PM. 30 tablet 5  . carvedilol (COREG) 3.125 MG tablet Take 1 tablet (3.125 mg total) by mouth 2 (two) times daily with a meal. 60 tablet 5  . cholecalciferol (VITAMIN D) 1000 UNITS tablet Take 1,000 Units by mouth daily.    . diphenhydrAMINE (BENADRYL ALLERGY) 25 mg capsule Take 25 mg by mouth at  bedtime as needed for sleep.    Marland Kitchen eplerenone (INSPRA) 25 MG tablet Take 0.5 tablets (12.5 mg total) by mouth daily. 30 tablet 11  . finasteride (PROSCAR) 5 MG tablet Take 5 mg by mouth daily.    . fish oil-omega-3 fatty acids 1000 MG capsule Take 2 g by mouth daily.    Marland Kitchen losartan (COZAAR) 25 MG tablet Take 1 tablet (25 mg total) by mouth at bedtime. 30 tablet 11   No current facility-administered medications for this visit.     No Known Allergies  Past Medical History:  Diagnosis Date  . BPH (benign prostatic hyperplasia)   . Hyperlipidemia   . NHL (non-Hodgkin's lymphoma) ([place])    nhl dx 10/2008    There were no vitals taken for this visit.   Hyperlipidemia LDL goal <70 Patient with LDL not to goal despite doing well with atorvastatin 80 mg daily.  Will have him add Repatha 140 mg every 14 days.  He notes that he will be getting new insurance as of January 1, so I have asked him to get a copy of the new [employer] card to me in the next few weeks.  Once we have that information we will be ready to submit a PA on January 2.  Patient is aware of the need to wait just 3 more weeks because of the upcoming change in policies.  In the meantime he was encouraged to continue with his exercise and healthy eating habits.     Tommy Medal PharmD CPP [facility]

## 2018-08-12 NOTE — Patient Instructions (Signed)
Call Edwin Morris or Edwin Morris at [phone] if you have any problems or concerns about your new cholesterol medication.  Please bring the new [employer] card (or email a copy to me) [email or web address]  We will start all the paperwork to get approval on January 2 and should have it approved within 7-10 days   Cholesterol Cholesterol is a fat. Your body needs a small amount of cholesterol. Cholesterol (plaque) may build up in your blood vessels (arteries). That makes you more likely to have a heart attack or stroke. You cannot feel your cholesterol level. Having a blood test is the only way to find out if your level is high. Keep your test results. Work with your doctor to keep your cholesterol at a good level. What do the results mean?  Total cholesterol is how much cholesterol is in your blood.  LDL is bad cholesterol. This is the type that can build up. Try to have low LDL.  HDL is good cholesterol. It cleans your blood vessels and carries LDL away. Try to have high HDL.  Triglycerides are fat that the body can store or burn for energy. What are good levels of cholesterol?  Total cholesterol below 200.  LDL below 100 is good for people who have health risks. LDL below 70 is good for people who have very high risks.  HDL above 40 is good. It is best to have HDL of 60 or higher.  Triglycerides below 150. How can I lower my cholesterol? Diet Follow your diet program as told by your doctor.  Choose fish, white meat chicken, or [place] that is roasted or baked. Try not to eat red meat, fried foods, sausage, or lunch meats.  Eat lots of fresh fruits and vegetables.  Choose whole grains, beans, pasta, potatoes, and cereals.  Choose olive oil, corn oil, or canola oil. Only use small amounts.  Try not to eat butter, mayonnaise, shortening, or palm kernel oils.  Try not to eat foods with trans fats.  Choose low-fat or nonfat dairy foods. ? Drink skim or nonfat milk. ? Eat  low-fat or nonfat yogurt and cheeses. ? Try not to drink whole milk or cream. ? Try not to eat ice cream, egg yolks, or full-fat cheeses.  Healthy desserts include angel food cake, ginger snaps, animal crackers, hard candy, popsicles, and low-fat or nonfat frozen yogurt. Try not to eat pastries, cakes, pies, and cookies.  Exercise Follow your exercise program as told by your doctor.  Be more active. Try gardening, walking, and taking the stairs.  Ask your doctor about ways that you can be more active.  Medicine  Take over-the-counter and prescription medicines only as told by your doctor. This information is not intended to replace advice given to you by your health care provider. Make sure you discuss any questions you have with your health care provider. Document Released: 11/14/2008 Document Revised: 03/19/2016 Document Reviewed: 02/28/2016 Elsevier Interactive Patient Education  [employer].

## 2018-08-13 ENCOUNTER — Other Ambulatory Visit (HOSPITAL_COMMUNITY): Payer: Self-pay

## 2018-08-13 MED ORDER — CARVEDILOL 3.125 MG PO TABS: 3.1250 mg | ORAL_TABLET | Freq: Two times a day (BID) | ORAL | 5 refills | Status: DC

## 2018-08-18 ENCOUNTER — Encounter: Payer: Self-pay | Admitting: Pharmacist Clinician (PhC)/ Clinical Pharmacy Specialist

## 2018-08-18 DIAGNOSIS — M67432 Ganglion, left wrist: Secondary | ICD-10-CM | POA: Diagnosis not present

## 2018-08-18 DIAGNOSIS — M25422 Effusion, left elbow: Secondary | ICD-10-CM | POA: Diagnosis not present

## 2018-08-18 NOTE — Assessment & Plan Note (Signed)
Patient with LDL not to goal despite doing well with atorvastatin 80 mg daily.  Will have him add Repatha 140 mg every 14 days.  He notes that he will be getting new insurance as of January 1, so I have asked him to get a copy of the new [employer] card to me in the next few weeks.  Once we have that information we will be ready to submit a PA on January 2.  Patient is aware of the need to wait just 3 more weeks because of the upcoming change in policies.  In the meantime he was encouraged to continue with his exercise and healthy eating habits.

## 2018-09-08 ENCOUNTER — Telehealth: Payer: Self-pay

## 2018-09-08 NOTE — Telephone Encounter (Signed)
Called pt to let them know that the insurance refers to do praluent instead of the repatha pushtronix system pt was compliant and will proceed to do a pa for praluent on cover my meds

## 2018-10-06 ENCOUNTER — Ambulatory Visit: Payer: PPO | Admitting: Internal Medicine

## 2018-10-08 ENCOUNTER — Telehealth: Payer: Self-pay

## 2018-10-08 NOTE — Telephone Encounter (Signed)
Called to let pt know amgen denied call repatha ready then [facility] left msg

## 2018-10-19 MED ORDER — EVOLOCUMAB WITH INFUSOR 420 MG/3.5ML ~~LOC~~ SOCT: 420.0000 mg | SUBCUTANEOUS | 11 refills | Status: DC

## 2018-10-19 NOTE — Telephone Encounter (Signed)
Returned a call to pt regarding repatha was denied with pt assistance sent rx to pharmcy and pt will determine copay and call us back

## 2018-10-19 NOTE — Addendum Note (Signed)
Addended by: Allean Found on: 10/19/2018 02:20 PM   Modules accepted: Orders

## 2018-10-28 ENCOUNTER — Other Ambulatory Visit: Payer: Self-pay

## 2018-10-28 MED ORDER — ATORVASTATIN CALCIUM 80 MG PO TABS: 80.0000 mg | ORAL_TABLET | Freq: Every day | ORAL | 10 refills | Status: DC

## 2018-11-01 ENCOUNTER — Other Ambulatory Visit: Payer: Self-pay

## 2018-11-01 MED ORDER — ATORVASTATIN CALCIUM 80 MG PO TABS: 80.0000 mg | ORAL_TABLET | Freq: Every day | ORAL | 2 refills | Status: DC

## 2018-11-01 NOTE — Telephone Encounter (Signed)
Rx(s) sent to pharmacy electronically.  

## 2018-11-17 ENCOUNTER — Telehealth: Payer: Self-pay

## 2018-11-17 NOTE — Telephone Encounter (Signed)
Called pt left msg asking for a call back regarding the [employer] foundation and if they've applied

## 2018-11-18 MED ORDER — EVOLOCUMAB WITH INFUSOR 420 MG/3.5ML ~~LOC~~ SOCT: 420.0000 mg | SUBCUTANEOUS | 11 refills | Status: DC

## 2018-11-18 NOTE — Addendum Note (Signed)
Addended by: Rockne Menghini on: 11/18/2018 10:26 AM   Modules accepted: Orders

## 2018-11-18 NOTE — Telephone Encounter (Signed)
Patient returned call to report that he was not able to get help from the [facility].  States he has appointment with [facility]inda next month and would like to take a written prescription with him to that appointment.  Will mail to him.

## 2019-02-17 IMAGING — DX DG CHEST 2V
2 series · 2 of 2 positions shown · non-contrast
Comparison: Portable chest x-ray November 16, 2017.

CLINICAL DATA: Status post CABG 4 days ago.

EXAM:
CHEST - 2 VIEW

[chest pa]
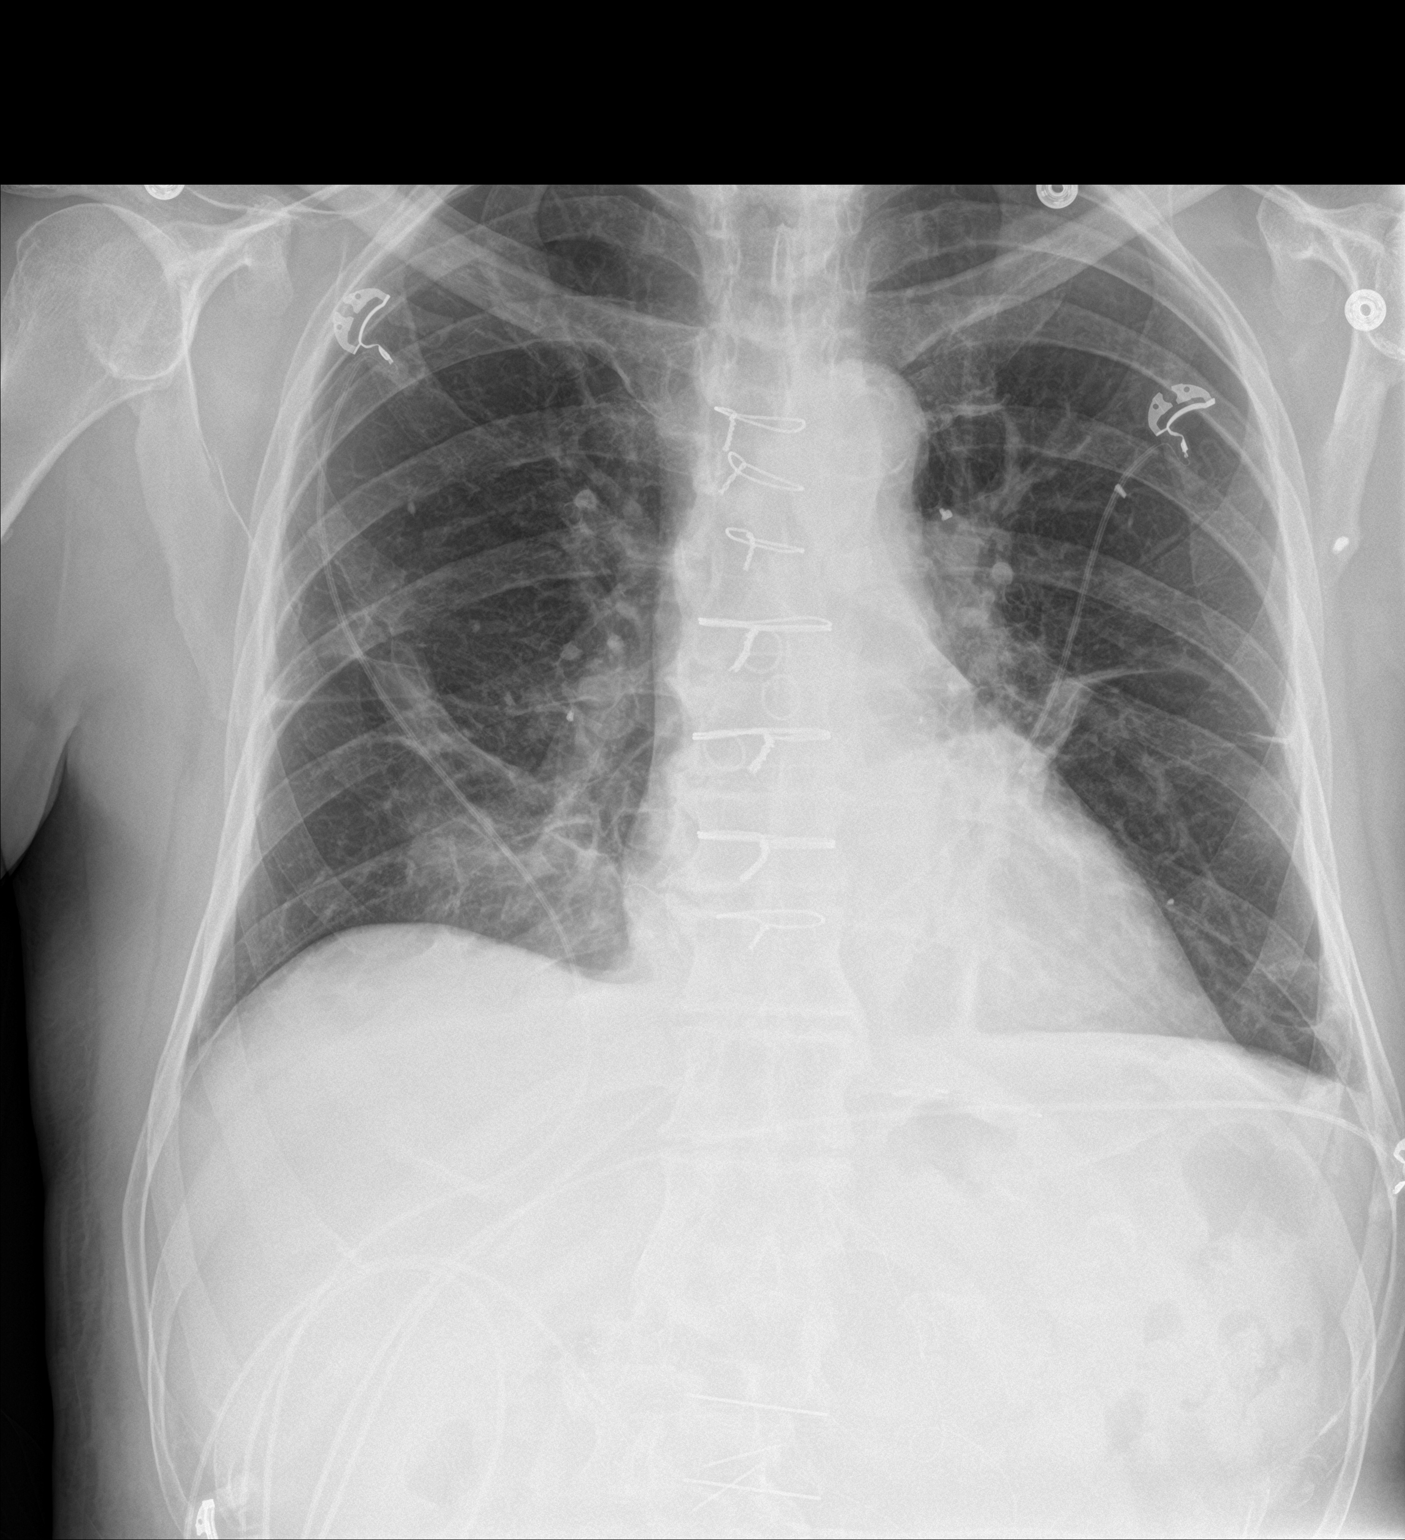

[chest lat]
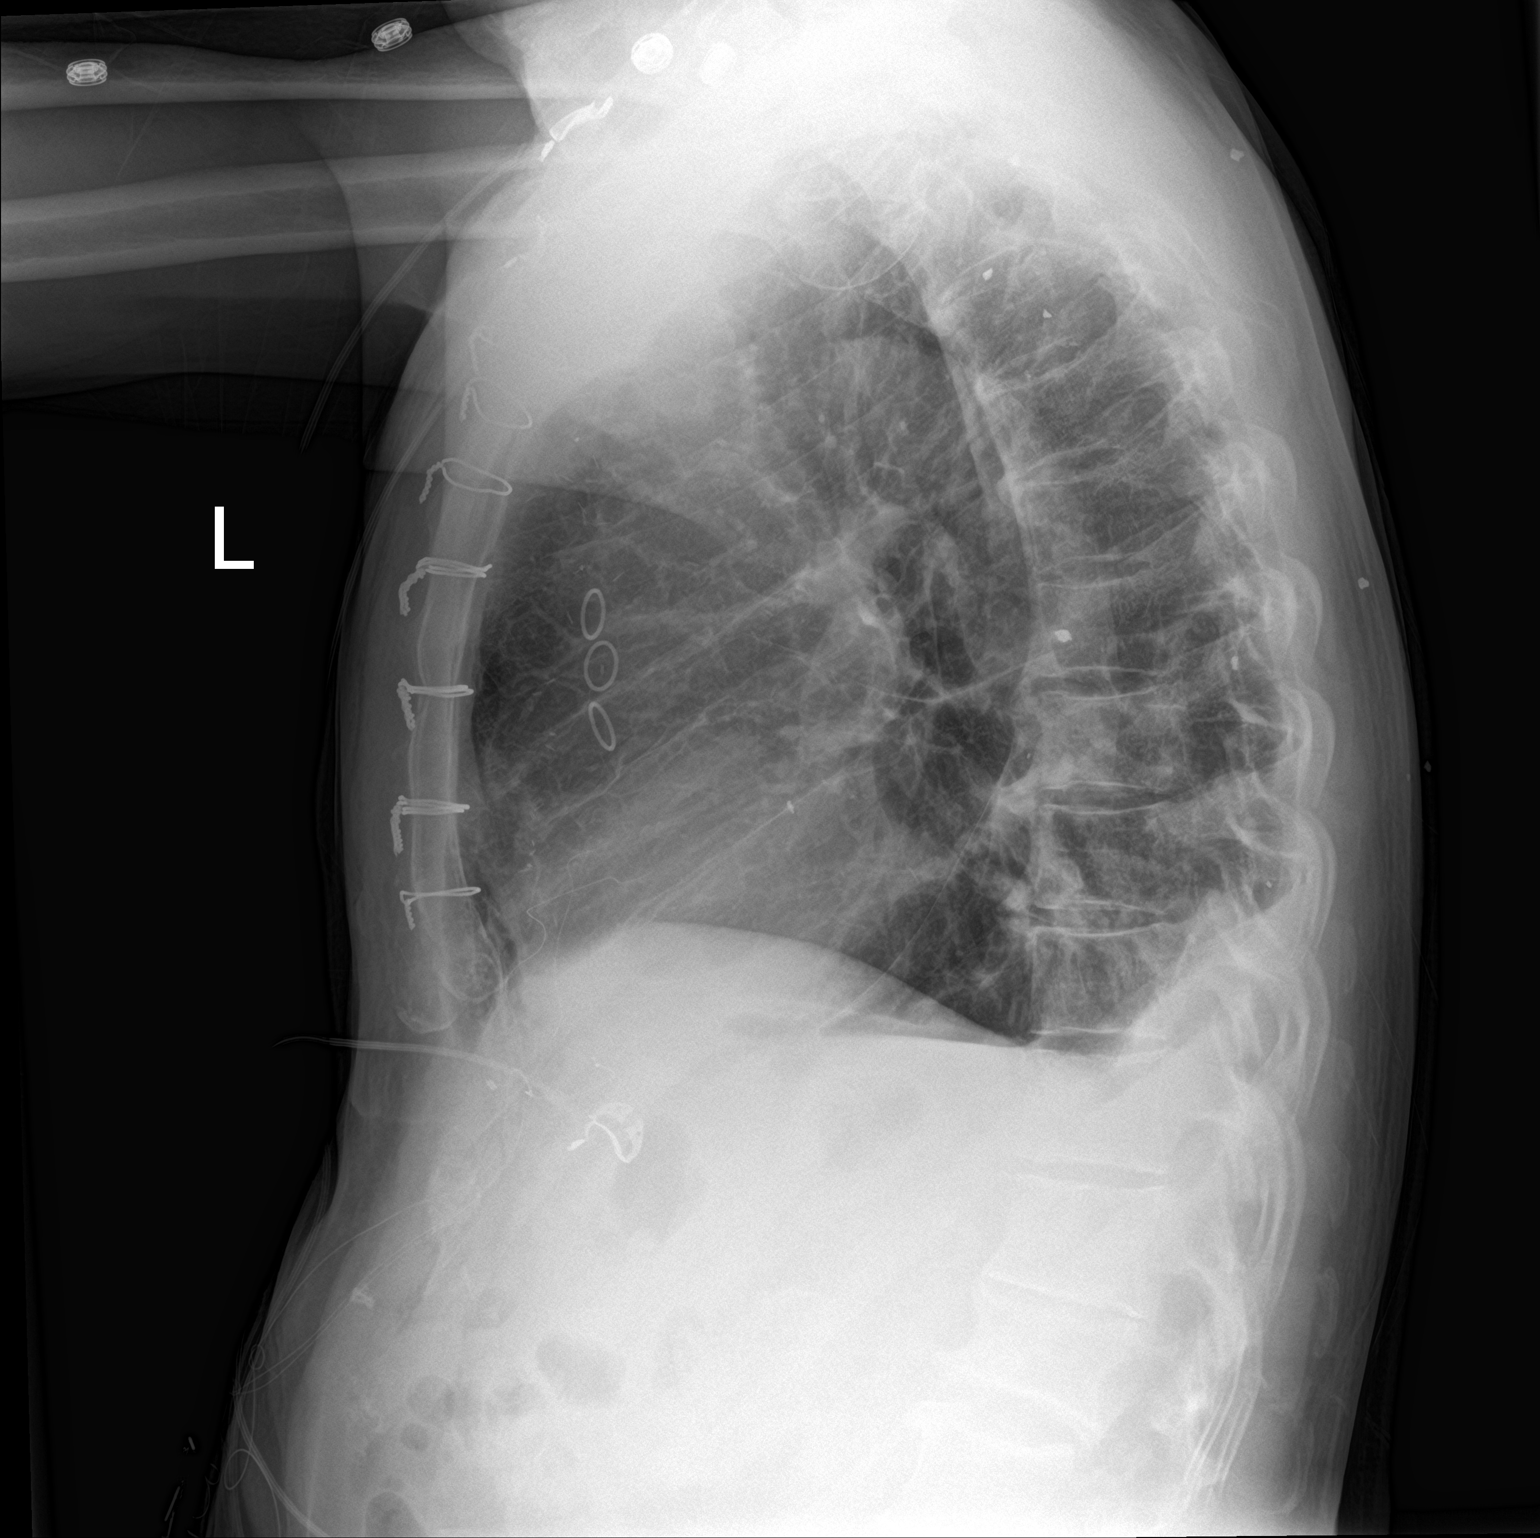

[2 of 2 positions shown; findings below may reference images not displayed]

FINDINGS: The lungs are well-expanded. There is persistent subsegmental
atelectasis in the right mid to lower lung and in the left lateral
costophrenic angle. Small amounts of pleural fluid blunt the
posterior costophrenic angles. The heart is normal in size. The
pulmonary vascularity is not engorged. There is calcification in the
wall of the aortic arch. The sternal wires are intact. Small amounts
of air are present in the retrosternal soft tissues. No frank
pneumothorax is observed.
IMPRESSION: Persistent subsegmental atelectasis bilaterally. Small bilateral
pleural effusions layering posteriorly. No pulmonary edema.

Small amounts of retrosternal air are present without evidence of a
pneumothorax.

## 2019-03-03 ENCOUNTER — Telehealth: Payer: Self-pay | Admitting: Cardiology

## 2019-03-03 NOTE — Telephone Encounter (Signed)
I called pt to remind him of his appt,, no answer and no voicemail.

## 2019-03-07 ENCOUNTER — Telehealth (INDEPENDENT_AMBULATORY_CARE_PROVIDER_SITE_OTHER): Payer: Self-pay | Admitting: Cardiology

## 2019-03-07 ENCOUNTER — Other Ambulatory Visit: Payer: Self-pay

## 2019-03-07 ENCOUNTER — Encounter: Payer: Self-pay | Admitting: Cardiology

## 2019-03-07 VITALS — BP 144/82 | HR 63 | Ht 72.0 in | Wt 161.2 lb

## 2019-03-07 DIAGNOSIS — I252 Old myocardial infarction: Secondary | ICD-10-CM

## 2019-03-07 DIAGNOSIS — Z8674 Personal history of sudden cardiac arrest: Secondary | ICD-10-CM

## 2019-03-07 DIAGNOSIS — I10 Essential (primary) hypertension: Secondary | ICD-10-CM

## 2019-03-07 DIAGNOSIS — Z7189 Other specified counseling: Secondary | ICD-10-CM

## 2019-03-07 DIAGNOSIS — Z8679 Personal history of other diseases of the circulatory system: Secondary | ICD-10-CM

## 2019-03-07 DIAGNOSIS — I2510 Atherosclerotic heart disease of native coronary artery without angina pectoris: Secondary | ICD-10-CM

## 2019-03-07 DIAGNOSIS — E785 Hyperlipidemia, unspecified: Secondary | ICD-10-CM

## 2019-03-07 DIAGNOSIS — Z951 Presence of aortocoronary bypass graft: Secondary | ICD-10-CM

## 2019-03-07 DIAGNOSIS — Z79899 Other long term (current) drug therapy: Secondary | ICD-10-CM

## 2019-03-07 DIAGNOSIS — I255 Ischemic cardiomyopathy: Secondary | ICD-10-CM

## 2019-03-07 MED ORDER — LOSARTAN POTASSIUM 25 MG PO TABS: 25.0000 mg | ORAL_TABLET | Freq: Every day | ORAL | 3 refills | Status: DC

## 2019-03-07 NOTE — Patient Instructions (Signed)

## 2019-03-07 NOTE — Progress Notes (Signed)
Virtual Visit via Telephone Note   This visit type was conducted due to national recommendations for restrictions regarding the COVID-19 Pandemic (e.g. social distancing) in an effort to limit this patient's exposure and mitigate transmission in our community.  Due to his co-morbid illnesses, this patient is at least at moderate risk for complications without adequate follow up.  This format is felt to be most appropriate for this patient at this time.  The patient did not have access to video technology/had technical difficulties with video requiring transitioning to audio format only (telephone).  All issues noted in this document were discussed and addressed.  No physical exam could be performed with this format.  Please refer to the patient's chart for his  consent to telehealth for [facility].   Date:  03/07/2019   ID:  Edwin Morris, DOB 06/18/1946, MRN [id number]  Patient Location: Home Provider Location: Home  PCP:  Bernerd Limbo, MD  Cardiologist:  Buford Dresser, MD  Electrophysiologist:  None   Evaluation Performed:  Follow-Up Visit  Chief Complaint:  Follow up  History of Present Illness:    Edwin Morris is a 73 y.o. male with with a hx of VF arrest 10/2017 found to have STEMI, now s/p CABG due to severe multivessel CAD, initial ischemic cardiomyopathy with improvement on most recent echo to 50-55% EF   The patient does have symptoms concerning for COVID-19 infection (fever, chills, cough, or new shortness of breath). Tested negative for COVID.  Today: Had high fevers, about 12 lbs unintentional weight loss several weeks ago. Tested for COVID, negative. Saw PCP, with his history of non-Hodgkins lymphoma had repeat CT which was normal. Treated for UTI, now feeling much better, has gained weight back.  Ran out of losartan about 2 weeks ago, prescription was expired so didn't refill. Taking eplerenone, was $150 but now $50 with Goodrx. Gynecomastia resolved  with switch to eplerenone. Can't afford repatha, hasn't been taking.   Walking daily with his wife, avoiding heat/sun.   Denies chest pain, shortness of breath at rest or with normal exertion. No PND, orthopnea, LE edema or unexpected weight gain. No syncope or palpitations.   Past Medical History:  Diagnosis Date   BPH (benign prostatic hyperplasia)    Hyperlipidemia    NHL (non-Hodgkin's lymphoma) ([place])    nhl dx 10/2008   Past Surgical History:  Procedure Laterality Date   CORONARY ARTERY BYPASS GRAFT N/A 11/13/2017   Procedure: CORONARY ARTERY BYPASS GRAFTING (CABG) times 5 using left internal mammary artery and right saphenous vein. (LIMA to LAD, SVG to DIAGONAL, SVG SEQUENTIALLY to DISTAL CIRCUMFLEX and OM1, SVG to RCA);  Surgeon: Grace Isaac, MD;  Location: [facility];  Service: Open Heart Surgery;  Laterality: N/A;   LEFT HEART CATH AND CORONARY ANGIOGRAPHY N/A 11/08/2017   Procedure: LEFT HEART CATH AND CORONARY ANGIOGRAPHY;  Surgeon: Troy Sine, MD;  Location: [facility];  Service: Cardiovascular;  Laterality: N/A;   TEE WITHOUT CARDIOVERSION N/A 11/13/2017   Procedure: TRANSESOPHAGEAL ECHOCARDIOGRAM (TEE);  Surgeon: Grace Isaac, MD;  Location: [facility];  Service: Open Heart Surgery;  Laterality: N/A;     Current Meds  Medication Sig   aspirin EC 81 MG EC tablet Take 1 tablet (81 mg total) by mouth daily.   atorvastatin (LIPITOR) 80 MG tablet Take 1 tablet (80 mg total) by mouth daily at 6 PM.   carvedilol (COREG) 3.125 MG tablet Take 1 tablet (3.125 mg total) by  mouth 2 (two) times daily with a meal.   cholecalciferol (VITAMIN D) 1000 UNITS tablet Take 1,000 Units by mouth daily.   diphenhydrAMINE (BENADRYL ALLERGY) 25 mg capsule Take 25 mg by mouth at bedtime as needed for sleep.   eplerenone (INSPRA) 25 MG tablet Take 0.5 tablets (12.5 mg total) by mouth daily.   Evolocumab with Infusor ([place]) 420 MG/3.5ML SOCT Inject 420  mg into the skin every 30 (thirty) days.   finasteride (PROSCAR) 5 MG tablet Take 5 mg by mouth daily.   fish oil-omega-3 fatty acids 1000 MG capsule Take 2 g by mouth daily.     Allergies:   Patient has no known allergies.   Social History   Tobacco Use   Smoking status: Never Smoker   Smokeless tobacco: Never Used  Substance Use Topics   Alcohol use: Not on file   Drug use: Not on file     Family Hx: The patient's family history includes Heart disease in his maternal uncle and paternal uncle. mat Gpa died age 39 of MI, mat Gma died in her 72s (unknown), pat Gpa died age 74, pat Gma died in her 52s. Pat uncle died at 42 of MI, other 6 uncles no heart disease (one lived to 48). Brother and sister in good health overall. No children.   ROS:   Please see the history of present illness.    Constitutional: Several weeks ago had fevers/unintentional weight loss, now resolved HENT: Negative for ear pain and hearing loss.   Eyes: Negative for loss of vision and eye pain.  Respiratory: Negative for cough, sputum, wheezing.   Cardiovascular: See HPI. Gastrointestinal: Negative for abdominal pain, melena, and hematochezia.  Genitourinary: Negative for dysuria and hematuria.  Musculoskeletal: Negative for falls and myalgias.  Skin: Negative for itching and rash.  Neurological: Negative for focal weakness, focal sensory changes and loss of consciousness.  Endo/Heme/Allergies: Does not bruise/bleed easily.  All other systems reviewed and are negative.   Prior CV studies:   The following studies were reviewed today: Cath 10/2017 Dr. Claiborne Billings   Prox LAD lesion is 85% stenosed.  Ost 1st Diag lesion is 85% stenosed.  Ost 1st Sept lesion is 95% stenosed.  Mid LAD lesion is 85% stenosed.  Ost 1st Diag to 1st Diag lesion is 70% stenosed.  Dist LM to Ost LAD lesion is 60% stenosed.  Ost Cx lesion is 65% stenosed.  Prox Cx lesion is 50% stenosed.  Mid Cx lesion is 50%  stenosed.  Mid RCA lesion is 60% stenosed.  Prox RCA to Mid RCA lesion is 70% stenosed.  Dist RCA lesion is 90% stenosed.  LV end diastolic pressure is normal.  There is moderate to severe left ventricular systolic dysfunction.  Acute coronary syndrome/STEMI complicated by VF cardiac arrest during EMS transfer to [facility] hospital, treated probably with CPR/multiple defibrillations with restoration of sinus rhythm.  Significant cardiac calcification and multivessel CAD with 60% ostial LAD stenosis, complex trifurcation stenosis of the proximal LAD, diagonal and septal perforating artery of 85-90, 95%, followed by diffuse 85% stenosis in the mid LAD after a sharp angle in the vessel with 70% mid diagonal stenosis; 60-70% ostial stenosis of the circumflex vessel followed by 40-50% proximal stenosis and 40-50% distal stenosis after distal marginal branch in a co-dominant circumflex system; and diffusely diseased mid RCA with narrowing of 60-70% with 90% distal RCA stenosis.  Moderately severe LV function with an EF of 35% with hypocontractility and distal anterolateral wall  and inferoapical to distal inferior hypokinesis.  RECOMMENDATION: There is TIMI-3 flow presently in all vessels. With significant coronary calcification and complex anatomy of the LAD with multivessel CAD, including ostial disease of the LAD and circumflex, will continue Aggrastat for at least 18 hours and obtain surgical consultation in a.m. for consideration of CABG revascularization surgery. Eill ask angiograms to be reviewed by colleagues. Heparin will be restarted. The patient was started on IV amiodarone. During the procedure and will continue with infusion. High potency statin therapy.  Echo 10/2017: LVEF 40-45%, mild LVH, mid to distal anterior, apical and inferoapical severe hypokinesis suggestive of LAD territory ischemia/infarct, grade 1 DD, indeterminate LV filing pressure, aortic valve sclerosis  with trivial AI, trivial MR, moderae LAE, trivial TR, RVSP 25 mmHg, elevated RA pressure of 8 mmHg (Est.).  Echo 01/2018: - Left ventricle: The cavity size was normal. There was moderate concentric hypertrophy. Systolic function was normal. The estimated ejection fraction was in the range of 50% to 55%. Wall motion was normal; there were no regional wall motion abnormalities. Doppler parameters are consistent with abnormal left ventricular relaxation (grade 1 diastolic dysfunction). There was no evidence of elevated ventricular filling pressure by Doppler parameters. - Aortic valve: Trileaflet; moderately thickened, moderately calcified leaflets. Valve mobility was restricted. There was mild regurgitation.  Impressions: - Normal global longitudinal strain: -19.9 %. Lateral S prime 10 cm/s.  Labs/Other Tests and Data Reviewed:    EKG:  An ECG dated 02/01/18 was personally reviewed today and demonstrated:  sinus brady with nonspecific st changes  Recent Labs: 08/05/2018: BUN 16; Creatinine, Ser 0.79; Potassium 5.2; Sodium 139   Recent Lipid Panel Lab Results  Component Value Date/Time   CHOL 170 08/05/2018 10:14 AM   TRIG 95 08/05/2018 10:14 AM   HDL 41 08/05/2018 10:14 AM   CHOLHDL 3.7 11/10/2017 05:50 AM   LDLCALC 110 (H) 08/05/2018 10:14 AM    Wt Readings from Last 3 Encounters:  03/07/19 161 lb 3.2 oz (73.1 kg)  08/05/18 165 lb (74.8 kg)  04/05/18 163 lb 2 oz (74 kg)     Objective:    Vital Signs:  BP (!) 144/82    Pulse 63    Ht 6' (1.829 m)    Wt 161 lb 3.2 oz (73.1 kg)    BMI 21.86 kg/m    Speaking comfortably on the phone, in no acute distress  ASSESSMENT & PLAN:    CAD: complicated by STEMI/VF arrest 10/2017 treated with 5V CABG (Dr. Servando Snare) LIMA-LAD, rSVG-Diag, sequential rSVG-OM1-dLCx, rSVG-dRCA -initially low EF, recovered post revascularization -on aspirin 81 mg -on atorvastatin 80 mg. Goal LDL <70 -on carvedilol -refilling  losartan today -on eplerenone (gynecomastia on spironolactone)  Hypertension: elevated today, but has been out of losartan for two weeks -carvedilol, eplerenone, losartan as above  Hyperlipidemia: goal LDL <70. Not at goal on max dose atorvastatin (LDL 110). Saw lipid clinic for PCSK9i, but he cannot afford. -will ask our PharmD to see if there are other ways to get for him -may need to add zetia if we can't make PCSK9i affordable, though this is unlikely to get him to LDL <70  Secondary prevention: -recommend heart healthy/Mediterranean diet, with whole grains, fruits, vegetable, fish, lean meats, nuts, and olive oil. Limit salt. -recommend moderate walking, 3-5 times/week for 30-50 minutes each session. Aim for at least 150 minutes.week. Goal should be pace of 3 miles/hours, or walking 1.5 miles in 30 minutes -recommend avoidance of tobacco products. Avoid excess alcohol.  COVID-19 Education: The signs and symptoms of COVID-19 were discussed with the patient and how to seek care for testing (follow up with PCP or arrange E-visit).  The importance of social distancing was discussed today.  Time:   Today, I have spent 15 minutes with the patient with telehealth technology discussing the above problems.     Medication Adjustments/Labs and Tests Ordered: Current medicines are reviewed at length with the patient today.  Concerns regarding medicines are outlined above.   Tests Ordered: No orders of the defined types were placed in this encounter.   Medication Changes: Meds ordered this encounter  Medications   losartan (COZAAR) 25 MG tablet    Sig: Take 1 tablet (25 mg total) by mouth at bedtime.    Dispense:  90 tablet    Refill:  3    Dose decrease    Follow Up: 6 mos  Signed, Buford Dresser, MD  03/07/2019 8:59 AM    [facility]

## 2019-08-19 ENCOUNTER — Other Ambulatory Visit: Payer: Self-pay | Admitting: Cardiology

## 2019-08-19 DIAGNOSIS — I255 Ischemic cardiomyopathy: Secondary | ICD-10-CM

## 2019-08-22 ENCOUNTER — Other Ambulatory Visit: Payer: Self-pay

## 2019-08-22 ENCOUNTER — Other Ambulatory Visit: Payer: Self-pay | Admitting: *Deleted

## 2019-08-22 MED ORDER — CARVEDILOL 3.125 MG PO TABS: 3.1250 mg | ORAL_TABLET | Freq: Two times a day (BID) | ORAL | 2 refills | Status: DC

## 2019-08-23 ENCOUNTER — Other Ambulatory Visit: Payer: Self-pay | Admitting: Cardiology

## 2019-08-23 DIAGNOSIS — I255 Ischemic cardiomyopathy: Secondary | ICD-10-CM

## 2019-08-24 ENCOUNTER — Other Ambulatory Visit: Payer: Self-pay | Admitting: Cardiology

## 2019-08-24 DIAGNOSIS — I255 Ischemic cardiomyopathy: Secondary | ICD-10-CM

## 2019-09-22 ENCOUNTER — Encounter: Payer: Self-pay | Admitting: Cardiology

## 2019-09-22 ENCOUNTER — Ambulatory Visit: Payer: Medicare HMO | Admitting: Cardiology

## 2019-09-22 ENCOUNTER — Other Ambulatory Visit: Payer: Self-pay

## 2019-09-22 VITALS — BP 120/78 | HR 55 | Temp 97.3°F | Ht 72.0 in | Wt 169.4 lb

## 2019-09-22 DIAGNOSIS — E785 Hyperlipidemia, unspecified: Secondary | ICD-10-CM

## 2019-09-22 DIAGNOSIS — I2510 Atherosclerotic heart disease of native coronary artery without angina pectoris: Secondary | ICD-10-CM | POA: Diagnosis not present

## 2019-09-22 DIAGNOSIS — I255 Ischemic cardiomyopathy: Secondary | ICD-10-CM

## 2019-09-22 DIAGNOSIS — Z7189 Other specified counseling: Secondary | ICD-10-CM

## 2019-09-22 DIAGNOSIS — Z8679 Personal history of other diseases of the circulatory system: Secondary | ICD-10-CM

## 2019-09-22 DIAGNOSIS — Z951 Presence of aortocoronary bypass graft: Secondary | ICD-10-CM

## 2019-09-22 NOTE — Patient Instructions (Signed)
Medication Instructions:  Your physician recommends that you continue on your current medications as directed. Please refer to the Current Medication list given to you today. *If you need a refill on your cardiac medications before your next appointment, please call your pharmacy*  Lab Work: None  If you have labs (blood work) drawn today and your tests are completely normal, you will receive your results only by: Marland Kitchen MyChart Message (if you have MyChart) OR . A paper copy in the mail If you have any lab test that is abnormal or we need to change your treatment, we will call you to review the results.  Testing/Procedures: None   Follow-Up: At [facility], you and your health needs are our priority.  As part of our continuing mission to provide you with exceptional heart care, we have created designated Provider Care Teams.  These Care Teams include your primary Cardiologist (physician) and Advanced Practice Providers (APPs -  Physician Assistants and Nurse Practitioners) who all work together to provide you with the care you need, when you need it.  Your next appointment:   6 month(s)  The format for your next appointment:   In Person  Provider:   Buford Dresser, MD  Other Instructions

## 2019-09-22 NOTE — Progress Notes (Signed)
Cardiology Office Note:    Date:  09/22/2019   ID:  Edwin Morris, DOB 1946/07/16, MRN [id number]  PCP:  Bernerd Limbo, MD  Cardiologist:  Buford Dresser, MD PhD HF: Dr. Haroldine Laws previously  Referring MD: Bernerd Limbo, MD   CC: follow up  History of Present Illness:    Edwin Morris is a 74 y.o. male with a hx of VF arrest 10/2017 found to have STEMI, s/p CABG due to severe multivessel CAD, initial ischemic cardiomyopathy with improvement on most recent echo to 50-55% EF who is seen for follow up. I initially met him 08/05/2018 as a new patient to me/previously Dr. Haroldine Laws at the request of Bernerd Limbo, MD for the evaluation and management of cardiovascular disease.  Pertinent cardiac history: 10/2017: VF arrest, STEMI, s/p CABG. Initial ischemic cardiomyopathy, now recovered EF Gynecomastia/tenderness with spironolactone  Today: Staying safe and away from Covid. Tolerating medications. Hasn't been able to afford PCSK9i still despite multiple attempts, including from the [place].   Wife getting Covid vaccine today, he hopes to get it when it is his turn. Discussed vaccine today.  Denies chest pain, shortness of breath at rest or with normal exertion. No PND, orthopnea, LE edema or unexpected weight gain. No syncope or palpitations. No bleeding issues.   Had some weight loss, fatigue in 12/2018. Found to have UTI, feeling much better since, no other issues.  Past Medical History:  Diagnosis Date  . BPH (benign prostatic hyperplasia)   . Hyperlipidemia   . NHL (non-Hodgkin's lymphoma) ([place])    nhl dx 10/2008    Past Surgical History:  Procedure Laterality Date  . CORONARY ARTERY BYPASS GRAFT N/A 11/13/2017   Procedure: CORONARY ARTERY BYPASS GRAFTING (CABG) times 5 using left internal mammary artery and right saphenous vein. (LIMA to LAD, SVG to DIAGONAL, SVG SEQUENTIALLY to DISTAL CIRCUMFLEX and OM1, SVG to RCA);  Surgeon: Grace Isaac, MD;  Location: [facility];   Service: Open Heart Surgery;  Laterality: N/A;  . LEFT HEART CATH AND CORONARY ANGIOGRAPHY N/A 11/08/2017   Procedure: LEFT HEART CATH AND CORONARY ANGIOGRAPHY;  Surgeon: Troy Sine, MD;  Location: [facility];  Service: Cardiovascular;  Laterality: N/A;  . TEE WITHOUT CARDIOVERSION N/A 11/13/2017   Procedure: TRANSESOPHAGEAL ECHOCARDIOGRAM (TEE);  Surgeon: Grace Isaac, MD;  Location: [facility];  Service: Open Heart Surgery;  Laterality: N/A;    Current Medications: Current Outpatient Medications on File Prior to Visit  Medication Sig  . aspirin EC 81 MG EC tablet Take 1 tablet (81 mg total) by mouth daily.  Marland Kitchen atorvastatin (LIPITOR) 80 MG tablet Take 1 tablet (80 mg total) by mouth daily at 6 PM.  . carvedilol (COREG) 3.125 MG tablet Take 1 tablet (3.125 mg total) by mouth 2 (two) times daily with a meal.  . cholecalciferol (VITAMIN D) 1000 UNITS tablet Take 1,000 Units by mouth daily.  . diphenhydrAMINE (BENADRYL ALLERGY) 25 mg capsule Take 25 mg by mouth at bedtime as needed for sleep.  Marland Kitchen eplerenone (INSPRA) 25 MG tablet Take 1/2 (one-half) tablet by mouth once daily  . Evolocumab with Infusor (Ovando) 420 MG/3.5ML SOCT Inject 420 mg into the skin every 30 (thirty) days.  . finasteride (PROSCAR) 5 MG tablet Take 5 mg by mouth daily.  . fish oil-omega-3 fatty acids 1000 MG capsule Take 2 g by mouth daily.  Marland Kitchen losartan (COZAAR) 25 MG tablet Take 1 tablet (25 mg total) by mouth at bedtime.  No current facility-administered medications on file prior to visit.     Allergies:   Patient has no known allergies.   Social History   Tobacco Use  . Smoking status: Never Smoker  . Smokeless tobacco: Never Used  Substance Use Topics  . Alcohol use: Not on file  . Drug use: Not on file    Family History: The patient's family history includes Heart disease in his maternal uncle and paternal uncle. mat Gpa died age 9 of MI, mat Gma died in her 45s (unknown), pat  Gpa died age 59, pat Gma died in her 63s. Pat uncle died at 37 of MI, other 6 uncles no heart disease (one lived to 2). Brother and sister in good health overall. No children.   ROS:   Please see the history of present illness.  Additional pertinent ROS negative except as noted.   EKGs/Labs/Other Studies Reviewed:    The following studies were reviewed today: Cath 10/2017 Dr. Claiborne Billings   Prox LAD lesion is 85% stenosed.  Ost 1st Diag lesion is 85% stenosed.  Ost 1st Sept lesion is 95% stenosed.  Mid LAD lesion is 85% stenosed.  Ost 1st Diag to 1st Diag lesion is 70% stenosed.  Dist LM to Ost LAD lesion is 60% stenosed.  Ost Cx lesion is 65% stenosed.  Prox Cx lesion is 50% stenosed.  Mid Cx lesion is 50% stenosed.  Mid RCA lesion is 60% stenosed.  Prox RCA to Mid RCA lesion is 70% stenosed.  Dist RCA lesion is 90% stenosed.  LV end diastolic pressure is normal.  There is moderate to severe left ventricular systolic dysfunction.   Acute coronary syndrome/STEMI complicated by VF cardiac arrest during EMS transfer to [facility] hospital, treated probably with CPR/multiple defibrillations with restoration of sinus rhythm.  Significant cardiac calcification and multivessel CAD with 60% ostial LAD stenosis, complex trifurcation stenosis of the proximal LAD, diagonal and septal perforating artery of 85-90, 95%, followed by diffuse 85% stenosis in the mid LAD after a sharp angle in the vessel with 70% mid diagonal stenosis; 60-70% ostial stenosis of the circumflex vessel followed by 40-50% proximal stenosis and 40-50% distal stenosis after distal marginal branch in a co-dominant circumflex system; and diffusely diseased mid RCA with narrowing of 60-70% with 90% distal RCA stenosis.  Moderately severe LV function with an EF of 35% with hypocontractility and distal anterolateral wall and inferoapical to distal inferior hypokinesis.  RECOMMENDATION: There is TIMI-3 flow presently in all  vessels.  With significant coronary calcification and complex anatomy of the LAD with multivessel CAD, including ostial disease of the LAD and circumflex, will continue Aggrastat for at least 18 hours and obtain surgical consultation in a.m. for consideration of CABG revascularization surgery.  Eill ask angiograms to  be reviewed by colleagues.  Heparin will be restarted.  The patient was started on IV amiodarone.  During the procedure and will continue with infusion.  High potency statin therapy.  Echo 10/2017: Study Conclusions  - Left ventricle: The cavity size was normal. Wall thickness was   increased in a pattern of mild LVH. Systolic function was mildly   to moderately reduced. The estimated ejection fraction was in the   range of 40% to 45%. Mid to distal anterior, apical and   inferoapical hypokinesis. Doppler parameters are consistent with   abnormal left ventricular relaxation (grade 1 diastolic   dysfunction). The E/e&' ratio is between 8-15, suggesting   indeterminate LV filling pressure. - Aortic valve: Trileaflet.  Sclerosis without stenosis. There was   trivial regurgitation. - Mitral valve: Mildly thickened leaflets and thickening of the   subvalvular apparatus. There was trivial regurgitation. - Left atrium: Moderately dilated. - Tricuspid valve: There was trivial regurgitation. - Pulmonary arteries: PA peak pressure: 25 mm Hg (S). - Systemic veins: The IVC measures <2.1 cm, but does not collapse   >50%, suggesting an elevated RA pressure of 8 mmHg.  Impressions:  - LVEF 40-45%, mild LVH, mid to distal anterior, apical and   inferoapical severe hypokinesis suggestive of LAD territory   ischemia/infarct, grade 1 DD, indeterminate LV filing pressure,   aortic valve sclerosis with trivial AI, trivial MR, moderae LAE,   trivial TR, RVSP 25 mmHg, elevated RA pressure of 8 mmHg (Est.).  Echo 01/2018: Study Conclusions  - Left ventricle: The cavity size was normal. There  was moderate   concentric hypertrophy. Systolic function was normal. The   estimated ejection fraction was in the range of 50% to 55%. Wall   motion was normal; there were no regional wall motion   abnormalities. Doppler parameters are consistent with abnormal   left ventricular relaxation (grade 1 diastolic dysfunction).   There was no evidence of elevated ventricular filling pressure by   Doppler parameters. - Aortic valve: Trileaflet; moderately thickened, moderately   calcified leaflets. Valve mobility was restricted. There was mild   regurgitation. - Mitral valve: There was mild regurgitation. - Right ventricle: The cavity size was mildly dilated. Wall   thickness was normal. Systolic function was normal. - Right atrium: The atrium was normal in size. - Tricuspid valve: There was mild regurgitation. - Pulmonary arteries: Systolic pressure was within the normal   range. - Inferior vena cava: The vessel was normal in size. The   respirophasic diameter changes were in the normal range (= 50%),   consistent with normal central venous pressure. - Pericardium, extracardiac: There was no pericardial effusion.  Impressions:  - Normal global longitudinal strain: -19.9 %. Lateral S prime 10   cm/s.  EKG:  EKG is personally reviewed.  The ekg ordered today demonstrates sinus bradycardia, IVCD  Recent Labs: No results found for requested labs within last 8760 hours.  Recent Lipid Panel    Component Value Date/Time   CHOL 170 08/05/2018 1014   TRIG 95 08/05/2018 1014   HDL 41 08/05/2018 1014   CHOLHDL 3.7 11/10/2017 0550   VLDL 15 11/10/2017 0550   LDLCALC 110 (H) 08/05/2018 1014    Physical Exam:    VS:  BP 120/78   Pulse (!) 55   Temp (!) 97.3 F (36.3 C)   Ht 6' (1.829 m)   Wt 169 lb 6.4 oz (76.8 kg)   SpO2 97%   BMI 22.97 kg/m     Wt Readings from Last 3 Encounters:  09/22/19 169 lb 6.4 oz (76.8 kg)  03/07/19 161 lb 3.2 oz (73.1 kg)  08/05/18 165 lb (74.8 kg)      GEN: Well nourished, well developed in no acute distress HEENT: Normal, moist mucous membranes NECK: No JVD CARDIAC: regular rhythm, normal S1 and S2, no rubs or gallops. No murmur. VASCULAR: Radial and DP pulses 2+ bilaterally. No carotid bruits RESPIRATORY:  Clear to auscultation without rales, wheezing or rhonchi  ABDOMEN: Soft, non-tender, non-distended MUSCULOSKELETAL:  Ambulates independently SKIN: Warm and dry, no edema NEUROLOGIC:  Alert and oriented x 3. No focal neuro deficits noted. PSYCHIATRIC:  Normal affect   ASSESSMENT:    1. Coronary artery  disease involving native coronary artery of native heart without angina pectoris   2. Ischemic cardiomyopathy   3. S/P CABG x 5   4. History of ventricular fibrillation   5. Hyperlipidemia LDL goal <70   6. Counseling on health promotion and disease prevention    PLAN:    CAD: complicated by STEMI/VF arrest 10/2017 treated with 5V CABG (Dr. Servando Snare) LIMA-LAD, rSVG-Diag, sequential rSVG-OM1-dLCx, rSVG-dRCA -initially low EF, recovered post revascularization -on aspirin 81 mg -on atorvastatin 80 mg. Goal LDL <70, see below -on carvedilol, losartan -on eplerenone (gynecomastia on spironolactone) -no angina  Hypertension: at goal today -carvedilol, eplerenone, losartan as above  Hyperlipidemia: goal LDL <70. Not at goal on max dose atorvastatin (LDL 110). Saw lipid clinic for PCSK9i, but he cannot afford. -will ask our PharmD team if with the new year there are any additional options to make this affordable  Secondary prevention: -recommend heart healthy/Mediterranean diet, with whole grains, fruits, vegetable, fish, lean meats, nuts, and olive oil. Limit salt. -recommend moderate walking, 3-5 times/week for 30-50 minutes each session. Aim for at least 150 minutes.week. Goal should be pace of 3 miles/hours, or walking 1.5 miles in 30 minutes -recommend avoidance of tobacco products. Avoid excess alcohol.  Plan for  follow up: 6 mos or sooner PRN  Medication Adjustments/Labs and Tests Ordered: Current medicines are reviewed at length with the patient today.  Concerns regarding medicines are outlined above.  Orders Placed This Encounter  Procedures  . EKG 12-Lead   No orders of the defined types were placed in this encounter.   Patient Instructions  Medication Instructions:  Your physician recommends that you continue on your current medications as directed. Please refer to the Current Medication list given to you today. *If you need a refill on your cardiac medications before your next appointment, please call your pharmacy*  Lab Work: None  If you have labs (blood work) drawn today and your tests are completely normal, you will receive your results only by: Marland Kitchen MyChart Message (if you have MyChart) OR . A paper copy in the mail If you have any lab test that is abnormal or we need to change your treatment, we will call you to review the results.  Testing/Procedures: None   Follow-Up: At [facility], you and your health needs are our priority.  As part of our continuing mission to provide you with exceptional heart care, we have created designated Provider Care Teams.  These Care Teams include your primary Cardiologist (physician) and Advanced Practice Providers (APPs -  Physician Assistants and Nurse Practitioners) who all work together to provide you with the care you need, when you need it.  Your next appointment:   6 month(s)  The format for your next appointment:   In Person  Provider:   Buford Dresser, MD  Other Instructions     Signed, Buford Dresser, MD PhD 09/22/2019   [place]

## 2019-09-26 ENCOUNTER — Telehealth: Payer: Self-pay

## 2019-09-26 NOTE — Telephone Encounter (Signed)
Called and spoke w/pt about the repatha and the pt stated that if he can't get it approved at a cheaper rate that he would much rather just stay on the current statin.

## 2019-10-20 ENCOUNTER — Telehealth: Payer: Self-pay | Admitting: Cardiology

## 2019-10-20 NOTE — Telephone Encounter (Signed)
Spoke with patient. Per Dr. Harrell Gave : "His numbers are not so off that the blood pressure or heart rate themselves should make him feel bad, and his BP being up slightly may be due to feeling poorly. I would recommend continuing to monitor, and if he does not improve would recommend he discuss with his primary care. If he has any very fast heart rhythms or passing out/blacking out, then I would recommend he come to ER for evaluation." Patient verbalized understanding.

## 2019-10-20 NOTE — Telephone Encounter (Signed)
His numbers are not so off that the blood pressure or heart rate themselves should make him feel bad, and his BP being up slightly may be due to feeling poorly. I would recommend continuing to monitor, and if he does not improve would recommend he discuss with his primary care. If he has any very fast heart rhythms or passing out/blacking out, then I would recommend he come to ER for evaluation.

## 2019-10-20 NOTE — Telephone Encounter (Signed)
New message  Pt c/o BP issue: STAT if pt c/o blurred vision, one-sided weakness or slurred speech  1. What are your last 5 BP readings? 165/80 laying down   2. Are you having any other symptoms (ex. Dizziness, headache, blurred vision, passed out)?dizziness, nauseated, discomfort in back of knee   3. What is your BP issue? B/p is elevated

## 2019-10-20 NOTE — Telephone Encounter (Signed)
Spoke with patient. Patient wasn't feeling well today so he checked his blood pressure and it is 165/80 with a heart rate of 58.  He reports he has checked it 2-3 times today. Patient feels nauseous and dizzy. He reports the symptoms are better since lying down. Patient denies chest pain, SOB and sweating. Patient reports he does have some leg pain that he was seen by his PCP for 2 days ago.   Patient has taken his daily medications.   Will route to MD for review.

## 2019-11-07 ENCOUNTER — Other Ambulatory Visit: Payer: Self-pay | Admitting: Cardiology

## 2019-11-07 DIAGNOSIS — I255 Ischemic cardiomyopathy: Secondary | ICD-10-CM

## 2019-11-08 ENCOUNTER — Other Ambulatory Visit: Payer: Self-pay

## 2020-01-31 ENCOUNTER — Other Ambulatory Visit: Payer: Self-pay | Admitting: Cardiology

## 2020-01-31 DIAGNOSIS — I255 Ischemic cardiomyopathy: Secondary | ICD-10-CM

## 2020-01-31 DIAGNOSIS — I10 Essential (primary) hypertension: Secondary | ICD-10-CM

## 2020-02-06 ENCOUNTER — Other Ambulatory Visit: Payer: Self-pay | Admitting: Cardiology

## 2020-02-06 DIAGNOSIS — I10 Essential (primary) hypertension: Secondary | ICD-10-CM

## 2020-02-06 DIAGNOSIS — I255 Ischemic cardiomyopathy: Secondary | ICD-10-CM

## 2020-02-20 ENCOUNTER — Other Ambulatory Visit: Payer: Self-pay | Admitting: Emergency Medicine

## 2020-02-20 NOTE — Telephone Encounter (Signed)
*  STAT* If patient is at the pharmacy, call can be transferred to refill team.   1. Which medications need to be refilled? (please list name of each medication and dose if known) atorvastatin (LIPITOR) 80 MG tablet losartan (COZAAR) 25 MG tablet eplerenone (INSPRA) 25 MG tablet  2. Which pharmacy/location (including street and city if local pharmacy) is medication to be sent to? [place] ([place] [address]  3. Do they need a 30 day or 90 day supply? 90 day for all

## 2020-03-19 NOTE — Progress Notes (Signed)
Cardiology Office Note   Date:  03/20/2020   ID:  Edwin Morris, DOB [birth year], MRN [id number]  PCP:  Bernerd Limbo, MD  Cardiologist:  Dr.Christopher  CC: Follow up CAD   History of Present Illness: Edwin Morris is a 74 y.o. male who presents for ongoing assessment and management of coronary artery disease with history of V. fib arrest on 11/06/2017, STEMI, status post CABG due to severe multivessel CAD.  Initially was found to have ischemic cardiomyopathy but EF was improved on most recent echo to 50 to 55%.  Dr. Harrell Gave noted that he was unable to afford PCSK9 inhibition despite multiple attempts to help him with this medication including from the [place].  On last office visit with Dr. Harrell Gave on 09/22/2019, he was doing well, blood pressure was well controlled and he was without any complaints.  He was noted that he was not at goal on maximum dose of atorvastatin (his LDL was found to be 110).  He was to continue in the lipid clinic for any additional options to make this more affordable to him.  He comes again today without any complaints on this office visit.  He remains active.  He denies any chest discomfort, dyspnea on exertion, fatigue, dizziness, or near syncope.  He is medically compliant.  He does state that he sometimes finds it hard to get his medication refills approved through our office.   Past Medical History:  Diagnosis Date  . BPH (benign prostatic hyperplasia)   . Hyperlipidemia   . NHL (non-Hodgkin's lymphoma) ([place])    nhl dx 10/2008    Past Surgical History:  Procedure Laterality Date  . CORONARY ARTERY BYPASS GRAFT N/A 11/13/2017   Procedure: CORONARY ARTERY BYPASS GRAFTING (CABG) times 5 using left internal mammary artery and right saphenous vein. (LIMA to LAD, SVG to DIAGONAL, SVG SEQUENTIALLY to DISTAL CIRCUMFLEX and OM1, SVG to RCA);  Surgeon: Grace Isaac, MD;  Location: [facility];  Service: Open Heart Surgery;  Laterality: N/A;  . LEFT HEART CATH  AND CORONARY ANGIOGRAPHY N/A 11/08/2017   Procedure: LEFT HEART CATH AND CORONARY ANGIOGRAPHY;  Surgeon: Troy Sine, MD;  Location: [facility];  Service: Cardiovascular;  Laterality: N/A;  . TEE WITHOUT CARDIOVERSION N/A 11/13/2017   Procedure: TRANSESOPHAGEAL ECHOCARDIOGRAM (TEE);  Surgeon: Grace Isaac, MD;  Location: [facility];  Service: Open Heart Surgery;  Laterality: N/A;     Current Outpatient Medications  Medication Sig Dispense Refill  . aspirin EC 81 MG EC tablet Take 1 tablet (81 mg total) by mouth daily. 30 tablet 0  . atorvastatin (LIPITOR) 80 MG tablet TAKE 1 TABLET BY MOUTH ONCE DAILY AT 6PM 90 tablet 3  . carvedilol (COREG) 3.125 MG tablet Take 1 tablet (3.125 mg total) by mouth 2 (two) times daily with a meal. 180 tablet 3  . cholecalciferol (VITAMIN D) 1000 UNITS tablet Take 1,000 Units by mouth daily.    . diphenhydrAMINE (BENADRYL ALLERGY) 25 mg capsule Take 25 mg by mouth at bedtime as needed for sleep.    Marland Kitchen eplerenone (INSPRA) 25 MG tablet Take 1/2 (one-half) tablet by mouth once daily 45 tablet 2  . Evolocumab with Infusor ([place]) 420 MG/3.5ML SOCT Inject 420 mg into the skin every 30 (thirty) days. 1 Cartridge 11  . finasteride (PROSCAR) 5 MG tablet Take 5 mg by mouth daily.    . fish oil-omega-3 fatty acids 1000 MG capsule Take 2 g by mouth daily.    Marland Kitchen  losartan (COZAAR) 25 MG tablet TAKE 1 TABLET BY MOUTH AT BEDTIME 90 tablet 3   No current facility-administered medications for this visit.    Allergies:   Patient has no known allergies.    Social History:  The patient  reports that he has never smoked. He has never used smokeless tobacco.   Family History:  The patient's family history includes Heart disease in his maternal uncle and paternal uncle.    ROS: All other systems are reviewed and negative. Unless otherwise mentioned in H&P    PHYSICAL EXAM: VS:  BP 120/70   Pulse 61   Ht 6' (1.829 m)   Wt 164 lb 9.6 oz (74.7  kg)   SpO2 96%   BMI 22.32 kg/m  , BMI Body mass index is 22.32 kg/m. GEN: Well nourished, well developed, in no acute distress HEENT: normal Neck: no JVD, carotid bruits, or masses Cardiac: RRR; no murmurs, rubs, or gallops,no edema  Respiratory:  Clear to auscultation bilaterally, normal work of breathing GI: soft, nontender, nondistended, + BS MS: no deformity or atrophy Skin: warm and dry, no rash Neuro:  Strength and sensation are intact Psych: euthymic mood, full affect   EKG:  None completed this office visit.   Recent Labs: No results found for requested labs within last 8760 hours.    Lipid Panel    Component Value Date/Time   CHOL 170 08/05/2018 1014   TRIG 95 08/05/2018 1014   HDL 41 08/05/2018 1014   CHOLHDL 3.7 11/10/2017 0550   VLDL 15 11/10/2017 0550   LDLCALC 110 (H) 08/05/2018 1014      Wt Readings from Last 3 Encounters:  03/20/20 164 lb 9.6 oz (74.7 kg)  09/22/19 169 lb 6.4 oz (76.8 kg)  03/07/19 161 lb 3.2 oz (73.1 kg)      Other studies Reviewed: Cath 10/2017 Dr. Claiborne Billings   Prox LAD lesion is 85% stenosed.  Ost 1st Diag lesion is 85% stenosed.  Ost 1st Sept lesion is 95% stenosed.  Mid LAD lesion is 85% stenosed.  Ost 1st Diag to 1st Diag lesion is 70% stenosed.  Dist LM to Ost LAD lesion is 60% stenosed.  Ost Cx lesion is 65% stenosed.  Prox Cx lesion is 50% stenosed.  Mid Cx lesion is 50% stenosed.  Mid RCA lesion is 60% stenosed.  Prox RCA to Mid RCA lesion is 70% stenosed.  Dist RCA lesion is 90% stenosed.  LV end diastolic pressure is normal.  There is moderate to severe left ventricular systolic dysfunction.  Acute coronary syndrome/STEMI complicated by VF cardiac arrest during EMS transfer to [facility] hospital, treated probably with CPR/multiple defibrillations with restoration of sinus rhythm.  Echo 01/2018: Study Conclusions  - Left ventricle: The cavity size was normal. There was moderate concentric  hypertrophy. Systolic function was normal. The estimated ejection fraction was in the range of 50% to 55%. Wall motion was normal; there were no regional wall motion abnormalities. Doppler parameters are consistent with abnormal left ventricular relaxation (grade 1 diastolic dysfunction). There was no evidence of elevated ventricular filling pressure by Doppler parameters. - Aortic valve: Trileaflet; moderately thickened, moderately calcified leaflets. Valve mobility was restricted. There was mild regurgitation. - Mitral valve: There was mild regurgitation. - Right ventricle: The cavity size was mildly dilated. Wall thickness was normal. Systolic function was normal. - Right atrium: The atrium was normal in size. - Tricuspid valve: There was mild regurgitation. - Pulmonary arteries: Systolic pressure was within the normal range. -  Inferior vena cava: The vessel was normal in size. The respirophasic diameter changes were in the normal range (= 50%), consistent with normal central venous pressure. - Pericardium, extracardiac: There was no pericardial effusion.  ASSESSMENT AND PLAN:  1.  Coronary artery disease: History of coronary artery bypass grafting after V. fib arrest in March 2019.  Follow-up echocardiogram revealed EF of 50 to 55%.  The patient denies any anginal symptoms and remains active and medically compliant.  No plan ischemic testing at this time.  He will continue a new secondary risk management.  2.  Hypertension: Currently well controlled on losartan 25 mg daily and carvedilol 3.125 mg twice daily.  He is provided refills on these medications.  3.  Hyperlipidemia: Continues on atorvastatin 80 mg daily.  He is unable to afford PCSK9 inhibition.  And it is not available through [place].  He has recently had his labs completed by primary care.  He states that his physician told him to the levels were good and he did not make any further recommendations on  medication adjustments.  We will need to get a copy of those labs.     Current medicines are reviewed at length with the patient today.  I have spent 25 minutes dedicated to the care of this patient on the date of this encounter to include pre-visit review of records, assessment, management and diagnostic testing,with shared decision making.  Labs/ tests ordered today include: None   Phill Myron. West Pugh, ANP, AACC   03/20/2020 11:16 AM    [facility] [address] Office [phone] Fax [phone]  Notice: This dictation was prepared with Dragon dictation along with smaller phrase technology. Any transcriptional errors that result from this process are unintentional and may not be corrected upon review.

## 2020-03-20 ENCOUNTER — Encounter: Payer: Self-pay | Admitting: Adult Health

## 2020-03-20 ENCOUNTER — Ambulatory Visit: Payer: Medicare HMO | Admitting: Adult Health

## 2020-03-20 ENCOUNTER — Other Ambulatory Visit: Payer: Self-pay

## 2020-03-20 VITALS — BP 120/70 | HR 61 | Ht 72.0 in | Wt 164.6 lb

## 2020-03-20 DIAGNOSIS — E785 Hyperlipidemia, unspecified: Secondary | ICD-10-CM

## 2020-03-20 DIAGNOSIS — Z951 Presence of aortocoronary bypass graft: Secondary | ICD-10-CM

## 2020-03-20 DIAGNOSIS — I10 Essential (primary) hypertension: Secondary | ICD-10-CM | POA: Diagnosis not present

## 2020-03-20 DIAGNOSIS — I2510 Atherosclerotic heart disease of native coronary artery without angina pectoris: Secondary | ICD-10-CM

## 2020-03-20 MED ORDER — CARVEDILOL 3.125 MG PO TABS: 3.1250 mg | ORAL_TABLET | Freq: Two times a day (BID) | ORAL | 3 refills | Status: DC

## 2020-03-20 NOTE — Patient Instructions (Signed)
Medication Instructions:  Continue current medication  *If you need a refill on your cardiac medications before your next appointment, please call your pharmacy*   Lab Work: None Ordered   Testing/Procedures: None Ordered   Follow-Up: At [facility], you and your health needs are our priority.  As part of our continuing mission to provide you with exceptional heart care, we have created designated Provider Care Teams.  These Care Teams include your primary Cardiologist (physician) and Advanced Practice Providers (APPs -  Physician Assistants and Nurse Practitioners) who all work together to provide you with the care you need, when you need it.  We recommend signing up for the patient portal called "MyChart".  Sign up information is provided on this After Visit Summary.  MyChart is used to connect with patients for Virtual Visits (Telemedicine).  Patients are able to view lab/test results, encounter notes, upcoming appointments, etc.  Non-urgent messages can be sent to your provider as well.   To learn more about what you can do with MyChart, go to [email or web address].    Your next appointment:   1 year(s)  The format for your next appointment:   In Person  Provider:   You may see Buford Dresser, MD or one of the following Advanced Practice Providers on your designated Care Team:    Rosaria Ferries, PA-C  Jory Sims, DNP, ANP  Cadence Kathlen Mody, PA-C

## 2020-11-23 ENCOUNTER — Other Ambulatory Visit: Payer: Self-pay | Admitting: Cardiology

## 2020-11-23 DIAGNOSIS — I255 Ischemic cardiomyopathy: Secondary | ICD-10-CM

## 2021-03-21 ENCOUNTER — Encounter (HOSPITAL_BASED_OUTPATIENT_CLINIC_OR_DEPARTMENT_OTHER): Payer: Self-pay | Admitting: *Deleted

## 2021-03-21 ENCOUNTER — Emergency Department (HOSPITAL_BASED_OUTPATIENT_CLINIC_OR_DEPARTMENT_OTHER)
Admission: EM | Admit: 2021-03-21 | Discharge: 2021-03-21 | Disposition: A | Discharge status: Home / Self Care | Payer: Medicare HMO | Attending: Emergency Medicine | Admitting: Emergency Medicine

## 2021-03-21 ENCOUNTER — Emergency Department (HOSPITAL_BASED_OUTPATIENT_CLINIC_OR_DEPARTMENT_OTHER): Payer: Medicare HMO

## 2021-03-21 ENCOUNTER — Other Ambulatory Visit (HOSPITAL_BASED_OUTPATIENT_CLINIC_OR_DEPARTMENT_OTHER): Payer: Self-pay

## 2021-03-21 ENCOUNTER — Other Ambulatory Visit: Payer: Self-pay

## 2021-03-21 ENCOUNTER — Encounter: Payer: Self-pay | Admitting: Emergency Medicine

## 2021-03-21 DIAGNOSIS — B9689 Other specified bacterial agents as the cause of diseases classified elsewhere: Secondary | ICD-10-CM | POA: Insufficient documentation

## 2021-03-21 DIAGNOSIS — Z79899 Other long term (current) drug therapy: Secondary | ICD-10-CM | POA: Diagnosis not present

## 2021-03-21 DIAGNOSIS — N50812 Left testicular pain: Secondary | ICD-10-CM | POA: Diagnosis present

## 2021-03-21 DIAGNOSIS — I219 Acute myocardial infarction, unspecified: Secondary | ICD-10-CM | POA: Insufficient documentation

## 2021-03-21 DIAGNOSIS — Z7982 Long term (current) use of aspirin: Secondary | ICD-10-CM | POA: Diagnosis not present

## 2021-03-21 DIAGNOSIS — N451 Epididymitis: Secondary | ICD-10-CM | POA: Diagnosis not present

## 2021-03-21 DIAGNOSIS — I5022 Chronic systolic (congestive) heart failure: Secondary | ICD-10-CM | POA: Insufficient documentation

## 2021-03-21 DIAGNOSIS — Z951 Presence of aortocoronary bypass graft: Secondary | ICD-10-CM | POA: Insufficient documentation

## 2021-03-21 HISTORY — DX: Acute myocardial infarction, unspecified: I21.9

## 2021-03-21 LAB — CBC WITH DIFFERENTIAL/PLATELET
Abs Immature Granulocytes: 0.08 10*3/uL — ABNORMAL HIGH (ref 0.00–0.07)
Basophils Absolute: 0 10*3/uL (ref 0.0–0.1)
Basophils Relative: 0 %
Eosinophils Absolute: 0 10*3/uL (ref 0.0–0.5)
Eosinophils Relative: 0 %
HCT: 42.8 % (ref 39.0–52.0)
Hemoglobin: 14.8 g/dL (ref 13.0–17.0)
Immature Granulocytes: 1 %
Lymphocytes Relative: 6 %
Lymphs Abs: 0.8 10*3/uL (ref 0.7–4.0)
MCH: 29.8 pg (ref 26.0–34.0)
MCHC: 34.6 g/dL (ref 30.0–36.0)
MCV: 86.3 fL (ref 80.0–100.0)
Monocytes Absolute: 1.3 10*3/uL — ABNORMAL HIGH (ref 0.1–1.0)
Monocytes Relative: 10 %
Neutro Abs: 10.8 10*3/uL — ABNORMAL HIGH (ref 1.7–7.7)
Neutrophils Relative %: 83 %
Platelets: 176 10*3/uL (ref 150–400)
RBC: 4.96 MIL/uL (ref 4.22–5.81)
RDW: 13.1 % (ref 11.5–15.5)
WBC: 13 10*3/uL — ABNORMAL HIGH (ref 4.0–10.5)
nRBC: 0 % (ref 0.0–0.2)

## 2021-03-21 LAB — URINALYSIS, MICROSCOPIC (REFLEX): WBC, UA: >50 WBC/hpf (ref 0–5)

## 2021-03-21 LAB — BASIC METABOLIC PANEL
Anion gap: 9 (ref 5–15)
BUN: 17 mg/dL (ref 8–23)
CO2: 22 mmol/L (ref 22–32)
Calcium: 8.5 mg/dL — ABNORMAL LOW (ref 8.9–10.3)
Chloride: 104 mmol/L (ref 98–111)
Creatinine, Ser: 0.76 mg/dL (ref 0.61–1.24)
GFR, Estimated: >60 mL/min (ref >60)
Glucose, Bld: 144 mg/dL — ABNORMAL HIGH (ref 70–99)
Potassium: 3.7 mmol/L (ref 3.5–5.1)
Sodium: 135 mmol/L (ref 135–145)

## 2021-03-21 LAB — URINALYSIS, ROUTINE W REFLEX MICROSCOPIC
Glucose, UA: NEGATIVE mg/dL
Ketones, ur: 15 mg/dL — AB
Nitrite: POSITIVE — AB
Protein, ur: 100 mg/dL — AB
Specific Gravity, Urine: 1.025 (ref 1.005–1.030)
pH: 5.5 (ref 5.0–8.0)

## 2021-03-21 MED ORDER — LEVOFLOXACIN 500 MG PO TABS
500.0000 mg | ORAL_TABLET | Freq: Every day | ORAL | 0 refills | Status: DC
  Filled 2021-03-21: qty 9, 9d supply, fill #0

## 2021-03-21 MED ORDER — LEVOFLOXACIN 500 MG PO TABS
500.0000 mg | ORAL_TABLET | Freq: Once | ORAL | Status: AC
  Administered 2021-03-21: 500 mg via ORAL
  Filled 2021-03-21: qty 1

## 2021-03-21 NOTE — ED Notes (Signed)
Labs delayed r/t [place] being at bedside

## 2021-03-21 NOTE — Discharge Instructions (Addendum)
You can start taking the antibiotic tomorrow and you will take it for the next 9 days.  You can roll up a washcloth and place it underneath your scrotum to elevate to hopefully help with the swelling and take Tylenol for pain.  Avoid working for the next few days until symptoms start to improve.

## 2021-03-21 NOTE — ED Provider Notes (Signed)
[facility] EMERGENCY DEPARTMENT Provider Note   CSN: [id number] Arrival date & time: 03/21/21  0840     History Chief Complaint  Patient presents with   Testicle Pain    Edwin Morris is a 75 y.o. male.  Patient is a 75 year old male with a history of non-Hodgkin's lymphoma, MI, hyperlipidemia, BPH who is presenting today with a 3-day history of worsening left testicular pain and swelling.  He has had some mild myalgias but denies any fever or chills.  No nausea or vomiting or abdominal pain.  No dysuria but did notice a small amount of blood in his urine last night.  No prior symptoms of similar issues.  Denies any trauma to the area.  No recent medication changes.  The history is provided by the patient.  Testicle Pain      Past Medical History:  Diagnosis Date   BPH (benign prostatic hyperplasia)    Hyperlipidemia    MI (myocardial infarction) ([place])    NHL (non-Hodgkin's lymphoma) ([place])    nhl dx 10/2008    Patient Active Problem List   Diagnosis Date Noted   MI (myocardial infarction) ([place]) 03/21/2021   Drug-induced gynecomastia 38/18/2993   Chronic systolic heart failure ([place]) 11/30/2017   S/P CABG x 5 11/13/2017   Coronary artery disease 11/13/2017   Ischemic cardiomyopathy    History of cardiac arrest 11/08/2017   History of ventricular fibrillation    History of ST elevation myocardial infarction (STEMI)    Frequent PVCs    Hyperlipidemia LDL goal <70    NHL (non-Hodgkin's lymphoma) ([place]) 06/07/2012    Past Surgical History:  Procedure Laterality Date   CORONARY ARTERY BYPASS GRAFT N/A 11/13/2017   Procedure: CORONARY ARTERY BYPASS GRAFTING (CABG) times 5 using left internal mammary artery and right saphenous vein. (LIMA to LAD, SVG to DIAGONAL, SVG SEQUENTIALLY to DISTAL CIRCUMFLEX and OM1, SVG to RCA);  Surgeon: Grace Isaac, MD;  Location: [facility];  Service: Open Heart Surgery;  Laterality: N/A;   LEFT HEART CATH AND CORONARY  ANGIOGRAPHY N/A 11/08/2017   Procedure: LEFT HEART CATH AND CORONARY ANGIOGRAPHY;  Surgeon: Troy Sine, MD;  Location: [facility];  Service: Cardiovascular;  Laterality: N/A;   TEE WITHOUT CARDIOVERSION N/A 11/13/2017   Procedure: TRANSESOPHAGEAL ECHOCARDIOGRAM (TEE);  Surgeon: Grace Isaac, MD;  Location: [facility];  Service: Open Heart Surgery;  Laterality: N/A;       Family History  Problem Relation Age of Onset   Heart disease Maternal Uncle    Heart disease Paternal Uncle     Social History   Tobacco Use   Smoking status: Never   Smokeless tobacco: Never    Home Medications Prior to Admission medications   Medication Sig Start Date End Date Taking? Authorizing Provider  aspirin EC 81 MG EC tablet Take 1 tablet (81 mg total) by mouth daily. 11/18/17   Grace Isaac, MD  atorvastatin (LIPITOR) 80 MG tablet TAKE 1 TABLET BY MOUTH ONCE DAILY AT [facility] 02/08/20   Buford Dresser, MD  carvedilol (COREG) 3.125 MG tablet Take 1 tablet (3.125 mg total) by mouth 2 (two) times daily with a meal. 03/20/20   Lendon Colonel, NP  cholecalciferol (VITAMIN D) 1000 UNITS tablet Take 1,000 Units by mouth daily.    [provider]  diphenhydrAMINE (BENADRYL ALLERGY) 25 mg capsule Take 25 mg by mouth at bedtime as needed for sleep.    [provider]  eplerenone (INSPRA)  25 MG tablet Take 1/2 (one-half) tablet by mouth once daily 11/23/20   Buford Dresser, MD  Evolocumab with Infusor ([place]) 420 MG/3.5ML SOCT Inject 420 mg into the skin every 30 (thirty) days. 11/18/18   Buford Dresser, MD  finasteride (PROSCAR) 5 MG tablet Take 5 mg by mouth daily.    [provider]  fish oil-omega-3 fatty acids 1000 MG capsule Take 2 g by mouth daily.    [provider]  losartan (COZAAR) 25 MG tablet TAKE 1 TABLET BY MOUTH AT BEDTIME 02/08/20   Buford Dresser, MD    Allergies    Patient has no known  allergies.  Review of Systems   Review of Systems  Genitourinary:  Positive for testicular pain.  All other systems reviewed and are negative.  Physical Exam Updated Vital Signs BP 118/65 (BP Location: Right Arm)   Pulse 88   Temp 98.8 F (37.1 C) (Oral)   Resp 18   Ht 6' (1.829 m)   Wt 74.8 kg   SpO2 96%   BMI 22.38 kg/m   Physical Exam Vitals and nursing note reviewed.  Constitutional:      General: He is not in acute distress.    Appearance: Normal appearance. He is normal weight.  HENT:     Head: Normocephalic.  Eyes:     Pupils: Pupils are equal, round, and reactive to light.  Cardiovascular:     Rate and Rhythm: Normal rate.     Pulses: Normal pulses.  Pulmonary:     Effort: Pulmonary effort is normal. No respiratory distress.  Abdominal:     General: Abdomen is flat. There is no distension.     Palpations: Abdomen is soft.     Tenderness: There is no abdominal tenderness. There is no guarding or rebound.     Hernia: No hernia is present.  Genitourinary:    Penis: Circumcised.      Testes:        Right: Mass, tenderness or swelling not present. Right testis is descended.        Left: Tenderness and swelling present. Mass not present.     Comments: Fullness and tenderness with palpation along the epididymis as well as left testicular pain and tenderness.  No fluctuance or drainage noted.  No crepitus or surrounding erythema. Musculoskeletal:        General: Normal range of motion.     Cervical back: Normal range of motion and neck supple.     Right lower leg: No edema.     Left lower leg: No edema.  Skin:    General: Skin is warm and dry.     Capillary Refill: Capillary refill takes less than 2 seconds.  Neurological:     Mental Status: He is alert and oriented to person, place, and time. Mental status is at baseline.  Psychiatric:        Mood and Affect: Mood normal.        Behavior: Behavior normal.    ED Results / Procedures / Treatments    Labs (all labs ordered are listed, but only abnormal results are displayed) Labs Reviewed  CBC WITH DIFFERENTIAL/PLATELET - Abnormal; Notable for the following components:      Result Value   WBC 13.0 (*)    Neutro Abs 10.8 (*)    Monocytes Absolute 1.3 (*)    Abs Immature Granulocytes 0.08 (*)    All other components within normal limits  BASIC METABOLIC PANEL - Abnormal;  Notable for the following components:   Glucose, Bld 144 (*)    Calcium 8.5 (*)    All other components within normal limits  URINALYSIS, ROUTINE W REFLEX MICROSCOPIC - Abnormal; Notable for the following components:   Color, Urine ORANGE (*)    APPearance TURBID (*)    Hgb urine dipstick LARGE (*)    Bilirubin Urine SMALL (*)    Ketones, ur 15 (*)    Protein, ur 100 (*)    Nitrite POSITIVE (*)    Leukocytes,Ua LARGE (*)    All other components within normal limits  URINALYSIS, MICROSCOPIC (REFLEX) - Abnormal; Notable for the following components:   Bacteria, UA MANY (*)    All other components within normal limits    EKG None  Radiology US SCROTUM W/DOPPLER  Result Date: 03/21/2021 CLINICAL DATA:  Acute left testicular pain and swelling. EXAM: SCROTAL ULTRASOUND DOPPLER ULTRASOUND OF THE TESTICLES TECHNIQUE: Complete ultrasound examination of the testicles, epididymis, and other scrotal structures was performed. Color and spectral Doppler ultrasound were also utilized to evaluate blood flow to the testicles. COMPARISON:  None. FINDINGS: Right testicle Measurements: 3.2 x 3.4 x 2.9 cm. No mass or microlithiasis visualized. Left testicle Measurements: 3.4 x 4.0 x 3.6 cm. No mass or microlithiasis visualized. Right epididymis: Multiple cysts are noted with the largest measuring 12 mm. Left epididymis: Hypervascular and complex in appearance concerning for epididymitis. Hydrocele:  Bilateral hydroceles are noted. Varicocele:  None visualized. Pulsed Doppler interrogation of both testes demonstrates normal low  resistance arterial and venous waveforms bilaterally. IMPRESSION: No evidence of testicular mass or torsion. Left epididymis is enlarged and hypervascular concerning for epididymitis. Bilateral hydroceles are noted. Electronically Signed   By: Marijo Conception M.D.   On: 03/21/2021 11:33    Procedures Procedures   Medications Ordered in ED Medications - No data to display  ED Course  I have reviewed the triage vital signs and the nursing notes.  Pertinent labs & imaging results that were available during my care of the patient were reviewed by me and considered in my medical decision making (see chart for details).  Patient with symptoms today concerning for left-sided epididymitis/orchitis.  Lower suspicion for scrotal abscess, no findings consistent with Fournier's, no palpable masses concerning for cancer.  Patient did notice some mild blood in his urine yesterday but denies any dysuria.  Low suspicion for torsion at this time.  Ultrasound and blood work are pending.  12:07 PM CBC with mild leukocytosis of 13,000, BMP with normal renal function, UA with nitrite positive and ultrasound consistent with epididymitis.  We will start patient on Levaquin 500 mg for the next 10 days.  Encouraged him to follow-up with his urologist as planned.  Patient also instructed to use Tylenol and elevate the scrotum when possible.  MDM   Amount and/or Complexity of Data Reviewed Clinical lab tests: ordered and reviewed Tests in the radiology section of CPT: ordered and reviewed Independent visualization of images, tracings, or specimens: yes  Patient Progress Patient progress: stable    MDM Rules/Calculators/A&P                            Final Clinical Impression(s) / ED Diagnoses Final diagnoses:  Epididymitis, left    Rx / DC Orders ED Discharge Orders          Ordered    levofloxacin (LEVAQUIN) 500 MG tablet  Daily  03/21/21 1210             Blanchie Dessert,  MD 03/21/21 [phone]

## 2021-03-21 NOTE — ED Triage Notes (Signed)
Testicular pain left side onset Tuesday  denies inj.  Increased swelling

## 2021-03-21 NOTE — ED Notes (Signed)
Signature pad not working pt verbalizes d/c teaching

## 2021-03-21 NOTE — ED Notes (Signed)
Pt ambulated to RR with steady gait for urine sample

## 2021-03-21 NOTE — ED Notes (Signed)
Signature pad not working, pt verbalizes MSE agreement.

## 2021-04-05 ENCOUNTER — Other Ambulatory Visit: Payer: Self-pay | Admitting: Cardiology

## 2021-04-05 DIAGNOSIS — I10 Essential (primary) hypertension: Secondary | ICD-10-CM

## 2021-04-05 DIAGNOSIS — I255 Ischemic cardiomyopathy: Secondary | ICD-10-CM

## 2021-04-08 ENCOUNTER — Telehealth (HOSPITAL_BASED_OUTPATIENT_CLINIC_OR_DEPARTMENT_OTHER): Payer: Self-pay | Admitting: Cardiology

## 2021-04-08 NOTE — Telephone Encounter (Signed)
Yep, his last potassium was normal on 7/21. Ok to fill. Thanks!

## 2021-04-08 NOTE — Telephone Encounter (Signed)
Spoke with patient regarding the 12 month follow up visit  with Dr. Duanne Limerick Friday 04/26/21 at 8:40 am.  Will mail information to patient and he voiced his understanding.

## 2021-04-19 ENCOUNTER — Other Ambulatory Visit: Payer: Self-pay | Admitting: Cardiology

## 2021-04-26 ENCOUNTER — Encounter (HOSPITAL_BASED_OUTPATIENT_CLINIC_OR_DEPARTMENT_OTHER): Payer: Self-pay | Admitting: Cardiology

## 2021-04-26 ENCOUNTER — Other Ambulatory Visit: Payer: Self-pay

## 2021-04-26 ENCOUNTER — Ambulatory Visit (HOSPITAL_BASED_OUTPATIENT_CLINIC_OR_DEPARTMENT_OTHER): Payer: Medicare HMO | Admitting: Cardiology

## 2021-04-26 VITALS — BP 132/70 | HR 58 | Ht 72.0 in | Wt 167.2 lb

## 2021-04-26 DIAGNOSIS — Z951 Presence of aortocoronary bypass graft: Secondary | ICD-10-CM | POA: Diagnosis not present

## 2021-04-26 DIAGNOSIS — E785 Hyperlipidemia, unspecified: Secondary | ICD-10-CM | POA: Diagnosis not present

## 2021-04-26 DIAGNOSIS — I2510 Atherosclerotic heart disease of native coronary artery without angina pectoris: Secondary | ICD-10-CM

## 2021-04-26 DIAGNOSIS — Z7189 Other specified counseling: Secondary | ICD-10-CM

## 2021-04-26 DIAGNOSIS — Z8679 Personal history of other diseases of the circulatory system: Secondary | ICD-10-CM

## 2021-04-26 DIAGNOSIS — I10 Essential (primary) hypertension: Secondary | ICD-10-CM | POA: Diagnosis not present

## 2021-04-26 NOTE — Progress Notes (Signed)
Cardiology Office Note:    Date:  04/26/2021   ID:  Edwin Morris, DOB 02/03/1946, MRN [id number]  PCP:  Bernerd Limbo, MD  Cardiologist:  Buford Dresser, MD PhD HF: Dr. Haroldine Laws previously  Referring MD: Bernerd Limbo, MD   CC: follow up  History of Present Illness:    Clemon Markas Morris is a 75 y.o. male with a hx of VF arrest 10/2017 found to have STEMI, s/p CABG due to severe multivessel CAD, initial ischemic cardiomyopathy with improvement on most recent echo to 50-55% EF who is seen for follow up. I initially met him 08/05/2018 as a new patient to me/previously Dr. Haroldine Laws at the request of Bernerd Limbo, MD for the evaluation and management of cardiovascular disease.  Pertinent cardiac history: 10/2017: VF arrest, STEMI, s/p CABG. Initial ischemic cardiomyopathy, now recovered EF Gynecomastia/tenderness with spironolactone  Today: Overall he is feeling good, with no new cardiovascular complaints. He remains compliant and tolerant of his medications. He follows at the [place] as well, reports having labs done this spring. He will send these to the office, he believes they include lipids.  For activity, he still works every day as a Development worker, community, on his own schedule. When he needs help he has assistance from his brother.  On 03/21/2021 he presented to the ED with epididymitis and was given an antibiotic. This has not recurred and he is doing better today.  He denies any palpitations, chest pain, or shortness of breath. No lightheadedness, headaches, syncope, orthopnea, or PND. Also has no lower extremity edema or exertional symptoms. Denies any hematuria or blood in his stool.   Past Medical History:  Diagnosis Date   BPH (benign prostatic hyperplasia)    Hyperlipidemia    MI (myocardial infarction) ([place])    NHL (non-Hodgkin's lymphoma) ([place])    nhl dx 10/2008    Past Surgical History:  Procedure Laterality Date   CORONARY ARTERY BYPASS GRAFT N/A 11/13/2017   Procedure:  CORONARY ARTERY BYPASS GRAFTING (CABG) times 5 using left internal mammary artery and right saphenous vein. (LIMA to LAD, SVG to DIAGONAL, SVG SEQUENTIALLY to DISTAL CIRCUMFLEX and OM1, SVG to RCA);  Surgeon: Grace Isaac, MD;  Location: [facility];  Service: Open Heart Surgery;  Laterality: N/A;   LEFT HEART CATH AND CORONARY ANGIOGRAPHY N/A 11/08/2017   Procedure: LEFT HEART CATH AND CORONARY ANGIOGRAPHY;  Surgeon: Troy Sine, MD;  Location: [facility];  Service: Cardiovascular;  Laterality: N/A;   TEE WITHOUT CARDIOVERSION N/A 11/13/2017   Procedure: TRANSESOPHAGEAL ECHOCARDIOGRAM (TEE);  Surgeon: Grace Isaac, MD;  Location: [facility];  Service: Open Heart Surgery;  Laterality: N/A;    Current Medications: Current Outpatient Medications on File Prior to Visit  Medication Sig   aspirin EC 81 MG EC tablet Take 1 tablet (81 mg total) by mouth daily.   atorvastatin (LIPITOR) 80 MG tablet TAKE 1 TABLET BY MOUTH ONCE DAILY AT 6PM   carvedilol (COREG) 3.125 MG tablet Take 1 tablet (3.125 mg total) by mouth 2 (two) times daily with a meal.   cholecalciferol (VITAMIN D) 1000 UNITS tablet Take 1,000 Units by mouth daily.   diphenhydrAMINE (BENADRYL) 25 mg capsule Take 25 mg by mouth at bedtime as needed for sleep.   eplerenone (INSPRA) 25 MG tablet Take 1/2 (one-half) tablet by mouth once daily   finasteride (PROSCAR) 5 MG tablet Take 5 mg by mouth daily.   losartan (COZAAR) 25 MG tablet TAKE 1 TABLET BY MOUTH AT BEDTIME  Evolocumab with Infusor ([place]) 420 MG/3.5ML SOCT Inject 420 mg into the skin every 30 (thirty) days. (Patient not taking: Reported on 04/26/2021)   fish oil-omega-3 fatty acids 1000 MG capsule Take 2 g by mouth daily. (Patient not taking: Reported on 04/26/2021)   levofloxacin (LEVAQUIN) 500 MG tablet Take 1 tablet (500 mg total) by mouth daily. Start taking them tomorrow 7/22 (Patient not taking: Reported on 04/26/2021)   No current  facility-administered medications on file prior to visit.     Allergies:   Patient has no known allergies.   Social History   Tobacco Use   Smoking status: Never   Smokeless tobacco: Never    Family History: The patient's family history includes Heart disease in his maternal uncle and paternal uncle. mat Gpa died age 47 of MI, mat Gma died in her 24s (unknown), pat Gpa died age 2, pat Gma died in her 71s. Pat uncle died at 23 of MI, other 6 uncles no heart disease (one lived to 27). Brother and sister in good health overall. No children.   ROS:   Please see the history of present illness.   Additional pertinent ROS negative except as noted.   EKGs/Labs/Other Studies Reviewed:    The following studies were reviewed today:  Cath 10/2017 Dr. Claiborne Billings  Prox LAD lesion is 85% stenosed. Ost 1st Diag lesion is 85% stenosed. Ost 1st Sept lesion is 95% stenosed. Mid LAD lesion is 85% stenosed. Ost 1st Diag to 1st Diag lesion is 70% stenosed. Dist LM to Ost LAD lesion is 60% stenosed. Ost Cx lesion is 65% stenosed. Prox Cx lesion is 50% stenosed. Mid Cx lesion is 50% stenosed. Mid RCA lesion is 60% stenosed. Prox RCA to Mid RCA lesion is 70% stenosed. Dist RCA lesion is 90% stenosed. LV end diastolic pressure is normal. There is moderate to severe left ventricular systolic dysfunction.   Acute coronary syndrome/STEMI complicated by VF cardiac arrest during EMS transfer to [facility] hospital, treated probably with CPR/multiple defibrillations with restoration of sinus rhythm.   Significant cardiac calcification and multivessel CAD with 60% ostial LAD stenosis, complex trifurcation stenosis of the proximal LAD, diagonal and septal perforating artery of 85-90, 95%, followed by diffuse 85% stenosis in the mid LAD after a sharp angle in the vessel with 70% mid diagonal stenosis; 60-70% ostial stenosis of the circumflex vessel followed by 40-50% proximal stenosis and 40-50% distal stenosis after  distal marginal branch in a co-dominant circumflex system; and diffusely diseased mid RCA with narrowing of 60-70% with 90% distal RCA stenosis.   Moderately severe LV function with an EF of 35% with hypocontractility and distal anterolateral wall and inferoapical to distal inferior hypokinesis.   RECOMMENDATION: There is TIMI-3 flow presently in all vessels.  With significant coronary calcification and complex anatomy of the LAD with multivessel CAD, including ostial disease of the LAD and circumflex, will continue Aggrastat for at least 18 hours and obtain surgical consultation in a.m. for consideration of CABG revascularization surgery.  Eill ask angiograms to  be reviewed by colleagues.  Heparin will be restarted.  The patient was started on IV amiodarone.  During the procedure and will continue with infusion.  High potency statin therapy.  Echo 10/2017: Study Conclusions   - Left ventricle: The cavity size was normal. Wall thickness was   increased in a pattern of mild LVH. Systolic function was mildly   to moderately reduced. The estimated ejection fraction was in the   range of  40% to 45%. Mid to distal anterior, apical and   inferoapical hypokinesis. Doppler parameters are consistent with   abnormal left ventricular relaxation (grade 1 diastolic   dysfunction). The E/e&' ratio is between 8-15, suggesting   indeterminate LV filling pressure. - Aortic valve: Trileaflet. Sclerosis without stenosis. There was   trivial regurgitation. - Mitral valve: Mildly thickened leaflets and thickening of the   subvalvular apparatus. There was trivial regurgitation. - Left atrium: Moderately dilated. - Tricuspid valve: There was trivial regurgitation. - Pulmonary arteries: PA peak pressure: 25 mm Hg (S). - Systemic veins: The IVC measures <2.1 cm, but does not collapse   >50%, suggesting an elevated RA pressure of 8 mmHg.   Impressions:   - LVEF 40-45%, mild LVH, mid to distal anterior, apical  and   inferoapical severe hypokinesis suggestive of LAD territory   ischemia/infarct, grade 1 DD, indeterminate LV filing pressure,   aortic valve sclerosis with trivial AI, trivial MR, moderae LAE,   trivial TR, RVSP 25 mmHg, elevated RA pressure of 8 mmHg (Est.).  Echo 01/2018: Study Conclusions   - Left ventricle: The cavity size was normal. There was moderate   concentric hypertrophy. Systolic function was normal. The   estimated ejection fraction was in the range of 50% to 55%. Wall   motion was normal; there were no regional wall motion   abnormalities. Doppler parameters are consistent with abnormal   left ventricular relaxation (grade 1 diastolic dysfunction).   There was no evidence of elevated ventricular filling pressure by   Doppler parameters. - Aortic valve: Trileaflet; moderately thickened, moderately   calcified leaflets. Valve mobility was restricted. There was mild   regurgitation. - Mitral valve: There was mild regurgitation. - Right ventricle: The cavity size was mildly dilated. Wall   thickness was normal. Systolic function was normal. - Right atrium: The atrium was normal in size. - Tricuspid valve: There was mild regurgitation. - Pulmonary arteries: Systolic pressure was within the normal   range. - Inferior vena cava: The vessel was normal in size. The   respirophasic diameter changes were in the normal range (= 50%),   consistent with normal central venous pressure. - Pericardium, extracardiac: There was no pericardial effusion.   Impressions:   - Normal global longitudinal strain: -19.9 %. Lateral S prime 10   cm/s.  EKG:  EKG is personally reviewed.   04/26/2021: sinus bradycardia at 58 bpm, RBBB 09/22/2019: sinus bradycardia, IVCD  Recent Labs: 03/21/2021: BUN 17; Creatinine, Ser 0.76; Hemoglobin 14.8; Platelets 176; Potassium 3.7; Sodium 135   Recent Lipid Panel    Component Value Date/Time   CHOL 170 08/05/2018 1014   TRIG 95 08/05/2018 1014    HDL 41 08/05/2018 1014   CHOLHDL 3.7 11/10/2017 0550   VLDL 15 11/10/2017 0550   LDLCALC 110 (H) 08/05/2018 1014    Physical Exam:    VS:  BP 132/70   Pulse (!) 58   Ht 6' (1.829 m)   Wt 167 lb 3.2 oz (75.8 kg)   BMI 22.68 kg/m     Wt Readings from Last 3 Encounters:  04/26/21 167 lb 3.2 oz (75.8 kg)  03/21/21 165 lb (74.8 kg)  03/20/20 164 lb 9.6 oz (74.7 kg)    GEN: Well nourished, well developed in no acute distress HEENT: Normal, moist mucous membranes NECK: No JVD CARDIAC: regular rhythm with a single premature beat, normal S1 and S2, no rubs or gallops. No murmur. VASCULAR: Radial and DP pulses 2+ bilaterally.  No carotid bruits RESPIRATORY:  Clear to auscultation without rales, wheezing or rhonchi  ABDOMEN: Soft, non-tender, non-distended MUSCULOSKELETAL:  Ambulates independently SKIN: Warm and dry, no edema NEUROLOGIC:  Alert and oriented x 3. No focal neuro deficits noted. PSYCHIATRIC:  Normal affect    ASSESSMENT:    1. Coronary artery disease involving native coronary artery of native heart without angina pectoris   2. S/P CABG x 5   3. Hyperlipidemia LDL goal <70   4. Essential hypertension   5. History of ventricular fibrillation   6. Counseling on health promotion and disease prevention     PLAN:    CAD: complicated by STEMI/VF arrest 10/2017 treated with 5V CABG (Dr. Servando Snare) LIMA-LAD, rSVG-Diag, sequential rSVG-OM1-dLCx, rSVG-dRCA -initially low EF, recovered post revascularization -on aspirin 81 mg -on atorvastatin 80 mg. Goal LDL <70, will send up recent lipids from the [place]. See below, previsouly not at goal -on carvedilol, losartan -on eplerenone (gynecomastia on spironolactone) -no angina   Hypertension: near goal today -carvedilol, eplerenone, losartan as above   Hyperlipidemia: goal LDL <70. -was not previously at goal on max dose atorvastatin (LDL 110). Saw lipid clinic for [id number], but he cannot afford. -he will send his lipid results  from the [place] -if not at goal, will ask our pharmacy team to see if coverage has improved or if there are other agents that might be options for him.     Secondary prevention: -recommend heart healthy/Mediterranean diet, with whole grains, fruits, vegetable, fish, lean meats, nuts, and olive oil. Limit salt. -recommend moderate walking, 3-5 times/week for 30-50 minutes each session. Aim for at least 150 minutes.week. Goal should be pace of 3 miles/hours, or walking 1.5 miles in 30 minutes -recommend avoidance of tobacco products. Avoid excess alcohol.  Plan for follow up: 1 year or sooner PRN  Medication Adjustments/Labs and Tests Ordered: Current medicines are reviewed at length with the patient today.  Concerns regarding medicines are outlined above.   No orders of the defined types were placed in this encounter.  No orders of the defined types were placed in this encounter.  Patient Instructions  Medication Instructions:  Your Physician recommend you continue on your current medication as directed.    *If you need a refill on your cardiac medications before your next appointment, please call your pharmacy*   Lab Work: Please have primary care fax recent lab work to [phone]   Testing/Procedures: None ordered   Follow-Up: At [facility], you and your health needs are our priority.  As part of our continuing mission to provide you with exceptional heart care, we have created designated Provider Care Teams.  These Care Teams include your primary Cardiologist (physician) and Advanced Practice Providers (APPs -  Physician Assistants and Nurse Practitioners) who all work together to provide you with the care you need, when you need it.  We recommend signing up for the patient portal called "MyChart".  Sign up information is provided on this After Visit Summary.  MyChart is used to connect with patients for Virtual Visits (Telemedicine).  Patients are able to view lab/test  results, encounter notes, upcoming appointments, etc.  Non-urgent messages can be sent to your provider as well.   To learn more about what you can do with MyChart, go to [email or web address].    Your next appointment:   1 year(s)  The format for your next appointment:   In Person  Provider:   Buford Dresser, MD      [facility],acting  as a Education administrator for [employer], MD.,have documented all relevant documentation on the behalf of Buford Dresser, MD,as directed by  Buford Dresser, MD while in the presence of Buford Dresser, MD.  I, Buford Dresser, MD, have reviewed all documentation for this visit. The documentation on 04/26/21 for the exam, diagnosis, procedures, and orders are all accurate and complete.   Signed, Buford Dresser, MD PhD 04/26/2021   [place]

## 2021-04-26 NOTE — Addendum Note (Signed)
Addended by: Ernie Hew D on: 04/26/2021 03:02 PM   Modules accepted: Orders

## 2021-04-26 NOTE — Patient Instructions (Signed)
Medication Instructions:  Your Physician recommend you continue on your current medication as directed.    *If you need a refill on your cardiac medications before your next appointment, please call your pharmacy*   Lab Work: Please have primary care fax recent lab work to [phone]) [phone]   Testing/Procedures: None ordered   Follow-Up: At [facility], you and your health needs are our priority.  As part of our continuing mission to provide you with exceptional heart care, we have created designated Provider Care Teams.  These Care Teams include your primary Cardiologist (physician) and Advanced Practice Providers (APPs -  Physician Assistants and Nurse Practitioners) who all work together to provide you with the care you need, when you need it.  We recommend signing up for the patient portal called "MyChart".  Sign up information is provided on this After Visit Summary.  MyChart is used to connect with patients for Virtual Visits (Telemedicine).  Patients are able to view lab/test results, encounter notes, upcoming appointments, etc.  Non-urgent messages can be sent to your provider as well.   To learn more about what you can do with MyChart, go to [email or web address].    Your next appointment:   1 year(s)  The format for your next appointment:   In Person  Provider:   Buford Dresser, MD

## 2021-05-30 ENCOUNTER — Other Ambulatory Visit: Payer: Self-pay | Admitting: Adult Health

## 2021-05-30 ENCOUNTER — Other Ambulatory Visit: Payer: Self-pay | Admitting: Cardiology

## 2021-05-30 DIAGNOSIS — I255 Ischemic cardiomyopathy: Secondary | ICD-10-CM

## 2021-05-31 NOTE — Telephone Encounter (Signed)
Rx(s) sent to pharmacy electronically.  

## 2021-07-08 ENCOUNTER — Telehealth (HOSPITAL_BASED_OUTPATIENT_CLINIC_OR_DEPARTMENT_OTHER): Payer: Self-pay | Admitting: Cardiology

## 2021-07-08 DIAGNOSIS — I255 Ischemic cardiomyopathy: Secondary | ICD-10-CM

## 2021-07-08 DIAGNOSIS — I10 Essential (primary) hypertension: Secondary | ICD-10-CM

## 2021-07-08 MED ORDER — LOSARTAN POTASSIUM 25 MG PO TABS: 25.0000 mg | ORAL_TABLET | Freq: Every day | ORAL | 1 refills | Status: DC

## 2021-07-08 NOTE — Telephone Encounter (Signed)
Received fax from [facility] on [address] requesting refills for Losartan 25 mg. Rx request sent to pharmacy.

## 2021-08-04 ENCOUNTER — Other Ambulatory Visit: Payer: Self-pay | Admitting: Cardiology

## 2021-08-26 ENCOUNTER — Other Ambulatory Visit: Payer: Self-pay | Admitting: Cardiology

## 2021-09-08 LAB — COLOGUARD: COLOGUARD: NEGATIVE

## 2022-01-05 ENCOUNTER — Other Ambulatory Visit: Payer: Self-pay | Admitting: Cardiology

## 2022-01-05 DIAGNOSIS — I255 Ischemic cardiomyopathy: Secondary | ICD-10-CM

## 2022-01-05 DIAGNOSIS — I10 Essential (primary) hypertension: Secondary | ICD-10-CM

## 2022-01-06 NOTE — Telephone Encounter (Signed)
Rx(s) sent to pharmacy electronically.  

## 2022-04-03 ENCOUNTER — Other Ambulatory Visit: Payer: Self-pay | Admitting: Cardiology

## 2022-04-03 DIAGNOSIS — I255 Ischemic cardiomyopathy: Secondary | ICD-10-CM

## 2022-04-03 DIAGNOSIS — I10 Essential (primary) hypertension: Secondary | ICD-10-CM

## 2022-04-03 NOTE — Telephone Encounter (Signed)
Rx(s) sent to pharmacy electronically.  

## 2022-04-17 ENCOUNTER — Other Ambulatory Visit: Payer: Self-pay | Admitting: Cardiology

## 2022-04-17 DIAGNOSIS — I255 Ischemic cardiomyopathy: Secondary | ICD-10-CM

## 2022-04-17 NOTE — Telephone Encounter (Signed)
Rx request sent to pharmacy.  

## 2022-05-23 ENCOUNTER — Encounter (HOSPITAL_BASED_OUTPATIENT_CLINIC_OR_DEPARTMENT_OTHER): Payer: Self-pay | Admitting: Cardiology

## 2022-05-23 ENCOUNTER — Ambulatory Visit (HOSPITAL_BASED_OUTPATIENT_CLINIC_OR_DEPARTMENT_OTHER): Payer: Medicare HMO | Admitting: Cardiology

## 2022-05-23 VITALS — BP 126/76 | HR 58 | Ht 72.0 in | Wt 168.0 lb

## 2022-05-23 DIAGNOSIS — Z8679 Personal history of other diseases of the circulatory system: Secondary | ICD-10-CM

## 2022-05-23 DIAGNOSIS — Z951 Presence of aortocoronary bypass graft: Secondary | ICD-10-CM

## 2022-05-23 DIAGNOSIS — I2510 Atherosclerotic heart disease of native coronary artery without angina pectoris: Secondary | ICD-10-CM | POA: Diagnosis not present

## 2022-05-23 DIAGNOSIS — I255 Ischemic cardiomyopathy: Secondary | ICD-10-CM

## 2022-05-23 DIAGNOSIS — E785 Hyperlipidemia, unspecified: Secondary | ICD-10-CM

## 2022-05-23 DIAGNOSIS — I10 Essential (primary) hypertension: Secondary | ICD-10-CM

## 2022-05-23 MED ORDER — EZETIMIBE 10 MG PO TABS: 10.0000 mg | ORAL_TABLET | Freq: Every day | ORAL | 3 refills | Status: DC

## 2022-05-23 NOTE — Progress Notes (Signed)
Cardiology Office Note:    Date:  05/23/2022   ID:  Edwin Morris, DOB 03/06/1946, MRN [id number]  PCP:  Bernerd Limbo, MD  Cardiologist:  Buford Dresser, MD PhD HF: Dr. Haroldine Laws previously  Referring MD: Bernerd Limbo, MD   CC: follow up  History of Present Illness:    Edwin Morris is a 76 y.o. male with a hx of VF arrest 10/2017 found to have STEMI, s/p CABG due to severe multivessel CAD, initial ischemic cardiomyopathy with improvement on most recent echo to 50-55% EF who is seen for follow up. I initially met him 08/05/2018 as a new patient to me/previously Dr. Haroldine Laws at the request of Bernerd Limbo, MD for the evaluation and management of cardiovascular disease.  Pertinent cardiac history: 10/2017: VF arrest, STEMI, s/p CABG. Initial ischemic cardiomyopathy, now recovered EF Gynecomastia/tenderness with spironolactone  Today: Doing well overall. 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 Cannot afford PCSK9i, doesn't get medications through the [place]. Has labs recently at the [place], also due for labs with his PCP in December.  Looked at bempedoic acid, listed as coverage exclusion based on his plan. Discussed options to intensify his lipid regimen. Unlikely to have Leqvio covered. Discussed ezetimibe, will probably not get him to goal, but may get [place] closer. He is amenable.  Denies chest pain, shortness of breath at rest or with normal exertion. No PND, orthopnea, LE edema or unexpected weight gain. No syncope or palpitations.    Past Medical History:  Diagnosis Date   BPH (benign prostatic hyperplasia)    Hyperlipidemia    MI (myocardial infarction) ([place])    NHL (non-Hodgkin's lymphoma) ([place])    nhl dx 10/2008    Past Surgical History:  Procedure Laterality Date   CORONARY ARTERY BYPASS GRAFT N/A 11/13/2017   Procedure: CORONARY ARTERY BYPASS GRAFTING (CABG) times 5 using left internal mammary artery and right saphenous vein. (LIMA to LAD, SVG to DIAGONAL, SVG  SEQUENTIALLY to DISTAL CIRCUMFLEX and OM1, SVG to RCA);  Surgeon: Grace Isaac, MD;  Location: [facility];  Service: Open Heart Surgery;  Laterality: N/A;   LEFT HEART CATH AND CORONARY ANGIOGRAPHY N/A 11/08/2017   Procedure: LEFT HEART CATH AND CORONARY ANGIOGRAPHY;  Surgeon: Troy Sine, MD;  Location: [facility];  Service: Cardiovascular;  Laterality: N/A;   TEE WITHOUT CARDIOVERSION N/A 11/13/2017   Procedure: TRANSESOPHAGEAL ECHOCARDIOGRAM (TEE);  Surgeon: Grace Isaac, MD;  Location: [facility];  Service: Open Heart Surgery;  Laterality: N/A;    Current Medications: Current Outpatient Medications on File Prior to Visit  Medication Sig   aspirin EC 81 MG EC tablet Take 1 tablet (81 mg total) by mouth daily.   atorvastatin (LIPITOR) 80 MG tablet TAKE 1 TABLET BY MOUTH ONCE DAILY (AT  6PM)   carvedilol (COREG) 3.125 MG tablet TAKE 1 TABLET BY MOUTH TWICE DAILY WITH A MEAL   cholecalciferol (VITAMIN D) 1000 UNITS tablet Take 1,000 Units by mouth daily.   diphenhydrAMINE (BENADRYL) 25 mg capsule Take 25 mg by mouth at bedtime as needed for sleep.   eplerenone (INSPRA) 25 MG tablet Take 1/2 (one-half) tablet by mouth once daily   finasteride (PROSCAR) 5 MG tablet Take 5 mg by mouth daily.   losartan (COZAAR) 25 MG tablet Take 1 tablet (25 mg total) by mouth at bedtime. NEED APPOINTMENT   No current facility-administered medications on file prior to visit.     Allergies:   Patient has no known allergies.  Social History   Tobacco Use   Smoking status: Never   Smokeless tobacco: Never    Family History: The patient's family history includes Heart disease in his maternal uncle and paternal uncle. mat Gpa died age 63 of MI, mat Gma died in her 60s (unknown), pat Gpa died age 95, pat Gma died in her 90s. Pat uncle died at 62 of MI, other 6 uncles no heart disease (one lived to 96). Brother and sister in good health overall. No children.   ROS:   Please see the history of  present illness.   Additional pertinent ROS negative except as noted.   EKGs/Labs/Other Studies Reviewed:    The following studies were reviewed today:  Cath 10/2017 Dr. Kelly  Prox LAD lesion is 85% stenosed. Ost 1st Diag lesion is 85% stenosed. Ost 1st Sept lesion is 95% stenosed. Mid LAD lesion is 85% stenosed. Ost 1st Diag to 1st Diag lesion is 70% stenosed. Dist LM to Ost LAD lesion is 60% stenosed. Ost Cx lesion is 65% stenosed. Prox Cx lesion is 50% stenosed. Mid Cx lesion is 50% stenosed. Mid RCA lesion is 60% stenosed. Prox RCA to Mid RCA lesion is 70% stenosed. Dist RCA lesion is 90% stenosed. LV end diastolic pressure is normal. There is moderate to severe left ventricular systolic dysfunction.   Acute coronary syndrome/STEMI complicated by VF cardiac arrest during EMS transfer to [facility], treated probably with CPR/multiple defibrillations with restoration of sinus rhythm.   Significant cardiac calcification and multivessel CAD with 60% ostial LAD stenosis, complex trifurcation stenosis of the proximal LAD, diagonal and septal perforating artery of 85-90, 95%, followed by diffuse 85% stenosis in the mid LAD after a sharp angle in the vessel with 70% mid diagonal stenosis; 60-70% ostial stenosis of the circumflex vessel followed by 40-50% proximal stenosis and 40-50% distal stenosis after distal marginal branch in a co-dominant circumflex system; and diffusely diseased mid RCA with narrowing of 60-70% with 90% distal RCA stenosis.   Moderately severe LV function with an EF of 35% with hypocontractility and distal anterolateral wall and inferoapical to distal inferior hypokinesis.   RECOMMENDATION: There is TIMI-3 flow presently in all vessels.  With significant coronary calcification and complex anatomy of the LAD with multivessel CAD, including ostial disease of the LAD and circumflex, will continue Aggrastat for at least 18 hours and obtain surgical consultation in  a.m. for consideration of CABG revascularization surgery.  Eill ask angiograms to  be reviewed by colleagues.  Heparin will be restarted.  The patient was started on IV amiodarone.  During the procedure and will continue with infusion.  High potency statin therapy.  Echo 10/2017: Study Conclusions   - Left ventricle: The cavity size was normal. Wall thickness was   increased in a pattern of mild LVH. Systolic function was mildly   to moderately reduced. The estimated ejection fraction was in the   range of 40% to 45%. Mid to distal anterior, apical and   inferoapical hypokinesis. Doppler parameters are consistent with   abnormal left ventricular relaxation (grade 1 diastolic   dysfunction). The E/e&' ratio is between 8-15, suggesting   indeterminate LV filling pressure. - Aortic valve: Trileaflet. Sclerosis without stenosis. There was   trivial regurgitation. - Mitral valve: Mildly thickened leaflets and thickening of the   subvalvular apparatus. There was trivial regurgitation. - Left atrium: Moderately dilated. - Tricuspid valve: There was trivial regurgitation. - Pulmonary arteries: PA peak pressure: 25 mm Hg (S). - Systemic   veins: The IVC measures <2.1 cm, but does not collapse   >50%, suggesting an elevated RA pressure of 8 mmHg.   Impressions:   - LVEF 40-45%, mild LVH, mid to distal anterior, apical and   inferoapical severe hypokinesis suggestive of LAD territory   ischemia/infarct, grade 1 DD, indeterminate LV filing pressure,   aortic valve sclerosis with trivial AI, trivial MR, moderae LAE,   trivial TR, RVSP 25 mmHg, elevated RA pressure of 8 mmHg (Est.).  Echo 01/2018: Study Conclusions   - Left ventricle: The cavity size was normal. There was moderate   concentric hypertrophy. Systolic function was normal. The   estimated ejection fraction was in the range of 50% to 55%. Wall   motion was normal; there were no regional wall motion   abnormalities. Doppler parameters  are consistent with abnormal   left ventricular relaxation (grade 1 diastolic dysfunction).   There was no evidence of elevated ventricular filling pressure by   Doppler parameters. - Aortic valve: Trileaflet; moderately thickened, moderately   calcified leaflets. Valve mobility was restricted. There was mild   regurgitation. - Mitral valve: There was mild regurgitation. - Right ventricle: The cavity size was mildly dilated. Wall   thickness was normal. Systolic function was normal. - Right atrium: The atrium was normal in size. - Tricuspid valve: There was mild regurgitation. - Pulmonary arteries: Systolic pressure was within the normal   range. - Inferior vena cava: The vessel was normal in size. The   respirophasic diameter changes were in the normal range (= 50%),   consistent with normal central venous pressure. - Pericardium, extracardiac: There was no pericardial effusion.   Impressions:   - Normal global longitudinal strain: -19.9 %. Lateral S prime 10   cm/s.  EKG:  EKG is personally reviewed.   05/23/22: sinus bradycardia at 58 bpm, RBBB 04/26/2021: sinus bradycardia at 58 bpm, RBBB 09/22/2019: sinus bradycardia, IVCD  Recent Labs: No results found for requested labs within last 365 days.   Recent Lipid Panel    Component Value Date/Time   CHOL 170 08/05/2018 1014   TRIG 95 08/05/2018 1014   HDL 41 08/05/2018 1014   CHOLHDL 3.7 11/10/2017 0550   VLDL 15 11/10/2017 0550   LDLCALC 110 (H) 08/05/2018 1014    Physical Exam:    VS:  BP 126/76   Pulse (!) 58   Ht 6' (1.829 m)   Wt 168 lb (76.2 kg)   BMI 22.78 kg/m     Wt Readings from Last 3 Encounters:  05/23/22 168 lb (76.2 kg)  04/26/21 167 lb 3.2 oz (75.8 kg)  03/21/21 165 lb (74.8 kg)    GEN: Well nourished, well developed in no acute distress HEENT: Normal, moist mucous membranes NECK: No JVD CARDIAC: regular rhythm, normal S1 and S2, no rubs or gallops. 1/6 systolic murmur. VASCULAR: Radial and DP  pulses 2+ bilaterally. No carotid bruits RESPIRATORY:  Clear to auscultation without rales, wheezing or rhonchi  ABDOMEN: Soft, non-tender, non-distended MUSCULOSKELETAL:  Ambulates independently SKIN: Warm and dry, no edema NEUROLOGIC:  Alert and oriented x 3. No focal neuro deficits noted. PSYCHIATRIC:  Normal affect    ASSESSMENT:    1. Ischemic cardiomyopathy   2. Coronary artery disease involving native coronary artery of native heart without angina pectoris   3. S/P CABG x 5   4. History of ventricular fibrillation   5. Hyperlipidemia LDL goal <70   6. Essential hypertension      PLAN:      CAD: complicated by STEMI/VF arrest 10/2017 treated with 5V CABG (Dr. Gerhardt) LIMA-LAD, rSVG-Diag, sequential rSVG-OM1-dLCx, rSVG-dRCA -initially low EF, recovered post revascularization -on aspirin 81 mg -on atorvastatin 80 mg. Goal LDL <70, not at goal, see below -on carvedilol, losartan -on eplerenone (gynecomastia on spironolactone) -no angina   Hypertension: at goal today -carvedilol, eplerenone, losartan as above   Hyperlipidemia: goal LDL <70. -was not previously at goal on max dose atorvastatin (LDL 110). Saw lipid clinic for PCSK9i, but he cannot afford. Bempedoic acid not covered on his plan. Unlikely Leqvio is covered. Do not think that ezetimibe will get us to goal, but may get us closer. Will start today. -due for recheck lipids in December, will ask that these be sent to us    Secondary prevention: -recommend heart healthy/Mediterranean diet, with whole grains, fruits, vegetable, fish, lean meats, nuts, and olive oil. Limit salt. -recommend moderate walking, 3-5 times/week for 30-50 minutes each session. Aim for at least 150 minutes.week. Goal should be pace of 3 miles/hours, or walking 1.5 miles in 30 minutes -recommend avoidance of tobacco products. Avoid excess alcohol.  Plan for follow up: 6 mos or sooner PRN  Medication Adjustments/Labs and Tests Ordered: Current  medicines are reviewed at length with the patient today.  Concerns regarding medicines are outlined above.   Orders Placed This Encounter  Procedures   EKG 12-Lead    Meds ordered this encounter  Medications   ezetimibe (ZETIA) 10 MG tablet    Sig: Take 1 tablet (10 mg total) by mouth daily.    Dispense:  90 tablet    Refill:  3    Patient Instructions  Medication Instructions:  START: Zetia 10 mg daily  *If you need a refill on your cardiac medications before your next appointment, please call your pharmacy*   Lab Work: None ordered today   Testing/Procedures: None ordered today   Follow-Up: At [facility], you and your health needs are our priority.  As part of our continuing mission to provide you with exceptional heart care, we have created designated Provider Care Teams.  These Care Teams include your primary Cardiologist (physician) and Advanced Practice Providers (APPs -  Physician Assistants and Nurse Practitioners) who all work together to provide you with the care you need, when you need it.  We recommend signing up for the patient portal called "MyChart".  Sign up information is provided on this After Visit Summary.  MyChart is used to connect with patients for Virtual Visits (Telemedicine).  Patients are able to view lab/test results, encounter notes, upcoming appointments, etc.  Non-urgent messages can be sent to your provider as well.   To learn more about what you can do with MyChart, go to [email or web address].    Your next appointment:   6 month(s)  The format for your next appointment:   In Person  Provider:   Bridgette Christopher, MD             Signed, Bridgette Christopher, MD PhD 05/23/2022   [facility] 

## 2022-05-23 NOTE — Patient Instructions (Addendum)
Medication Instructions:  START: Zetia 10 mg daily  *If you need a refill on your cardiac medications before your next appointment, please call your pharmacy*   Lab Work: None ordered today   Testing/Procedures: None ordered today   Follow-Up: At [facility], you and your health needs are our priority.  As part of our continuing mission to provide you with exceptional heart care, we have created designated Provider Care Teams.  These Care Teams include your primary Cardiologist (physician) and Advanced Practice Providers (APPs -  Physician Assistants and Nurse Practitioners) who all work together to provide you with the care you need, when you need it.  We recommend signing up for the patient portal called "MyChart".  Sign up information is provided on this After Visit Summary.  MyChart is used to connect with patients for Virtual Visits (Telemedicine).  Patients are able to view lab/test results, encounter notes, upcoming appointments, etc.  Non-urgent messages can be sent to your provider as well.   To learn more about what you can do with MyChart, go to [email or web address].    Your next appointment:   6 month(s)  The format for your next appointment:   In Person  Provider:   Buford Dresser, MD

## 2022-07-28 ENCOUNTER — Other Ambulatory Visit: Payer: Self-pay | Admitting: Cardiology

## 2022-07-28 DIAGNOSIS — I255 Ischemic cardiomyopathy: Secondary | ICD-10-CM

## 2022-07-28 DIAGNOSIS — I10 Essential (primary) hypertension: Secondary | ICD-10-CM

## 2022-07-28 NOTE — Telephone Encounter (Signed)
Rx request sent to pharmacy.  

## 2022-08-19 ENCOUNTER — Other Ambulatory Visit: Payer: Self-pay | Admitting: Cardiology

## 2022-08-19 NOTE — Telephone Encounter (Signed)
Rx request sent to pharmacy.  

## 2022-10-21 ENCOUNTER — Other Ambulatory Visit: Payer: Self-pay | Admitting: Cardiology

## 2022-10-21 NOTE — Telephone Encounter (Signed)
Rx request sent to pharmacy.  

## 2022-11-11 ENCOUNTER — Encounter (HOSPITAL_BASED_OUTPATIENT_CLINIC_OR_DEPARTMENT_OTHER): Payer: Self-pay | Admitting: Cardiology

## 2022-11-11 ENCOUNTER — Ambulatory Visit (HOSPITAL_BASED_OUTPATIENT_CLINIC_OR_DEPARTMENT_OTHER): Payer: Medicare HMO | Admitting: Cardiology

## 2022-11-11 VITALS — BP 130/80 | HR 56 | Ht 72.0 in | Wt 170.0 lb

## 2022-11-11 DIAGNOSIS — E785 Hyperlipidemia, unspecified: Secondary | ICD-10-CM | POA: Diagnosis not present

## 2022-11-11 DIAGNOSIS — Z951 Presence of aortocoronary bypass graft: Secondary | ICD-10-CM

## 2022-11-11 DIAGNOSIS — I2510 Atherosclerotic heart disease of native coronary artery without angina pectoris: Secondary | ICD-10-CM

## 2022-11-11 DIAGNOSIS — I10 Essential (primary) hypertension: Secondary | ICD-10-CM | POA: Diagnosis not present

## 2022-11-11 DIAGNOSIS — Z7189 Other specified counseling: Secondary | ICD-10-CM

## 2022-11-11 MED ORDER — ASPIRIN 81 MG PO TBEC: 81.0000 mg | DELAYED_RELEASE_TABLET | Freq: Every day | ORAL | 3 refills | Status: DC

## 2022-11-11 MED ORDER — ASPIRIN 81 MG PO TBEC: 81.0000 mg | DELAYED_RELEASE_TABLET | Freq: Every day | ORAL | 3 refills | Status: AC

## 2022-11-11 NOTE — Patient Instructions (Signed)
Medication Instructions:  Start taking Aspirin  *If you need a refill on your cardiac medications before your next appointment, please call your pharmacy*   Lab Work: None     Testing/Procedures: None   Follow-Up: At [facility], you and your health needs are our priority.  As part of our continuing mission to provide you with exceptional heart care, we have created designated Provider Care Teams.  These Care Teams include your primary Cardiologist (physician) and Advanced Practice Providers (APPs -  Physician Assistants and Nurse Practitioners) who all work together to provide you with the care you need, when you need it.  We recommend signing up for the patient portal called "MyChart".  Sign up information is provided on this After Visit Summary.  MyChart is used to connect with patients for Virtual Visits (Telemedicine).  Patients are able to view lab/test results, encounter notes, upcoming appointments, etc.  Non-urgent messages can be sent to your provider as well.   To learn more about what you can do with MyChart, go to [email or web address].    Your next appointment:   1 year(s)  Provider:   Buford Dresser, MD    Other Instructions None

## 2022-11-11 NOTE — Progress Notes (Signed)
Cardiology Office Note:    Date:  11/11/2022   ID:  Edwin Morris, DOB 02/11/1946, MRN [id number]  PCP:  Bernerd Limbo, MD  Cardiologist:  Buford Dresser, MD PhD HF: Dr. Haroldine Laws previously  Referring MD: Bernerd Limbo, MD   CC: follow up  History of Present Illness:    Edwin Morris is a 77 y.o. male with a hx of VF arrest 10/2017 found to have STEMI, s/p CABG due to severe multivessel CAD, initial ischemic cardiomyopathy with improvement on most recent echo to 50-55% EF who is seen for follow up. I initially met him 08/05/2018 as a new patient to me/previously Dr. Haroldine Laws at the request of Bernerd Limbo, MD for the evaluation and management of cardiovascular disease.  Pertinent cardiac history: 10/2017: VF arrest, STEMI, s/p CABG. Initial ischemic cardiomyopathy, now recovered EF Gynecomastia/tenderness with spironolactone  Last visit he had been able to work with no limitations and was doing well.  Today the patient states that he has been doing well recently. He is still able to work with no limitation, he has had no cardiac symptoms related to exertion. His labs have greatly improved in the past year.   He denies any palpitations, chest pain, shortness of breath, or peripheral edema. No lightheadedness, headaches, syncope, orthopnea, or PND.  Past Medical History:  Diagnosis Date   BPH (benign prostatic hyperplasia)    Hyperlipidemia    MI (myocardial infarction) ([place])    NHL (non-Hodgkin's lymphoma) ([place])    nhl dx 10/2008    Past Surgical History:  Procedure Laterality Date   CORONARY ARTERY BYPASS GRAFT N/A 11/13/2017   Procedure: CORONARY ARTERY BYPASS GRAFTING (CABG) times 5 using left internal mammary artery and right saphenous vein. (LIMA to LAD, SVG to DIAGONAL, SVG SEQUENTIALLY to DISTAL CIRCUMFLEX and OM1, SVG to RCA);  Surgeon: Grace Isaac, MD;  Location: [facility];  Service: Open Heart Surgery;  Laterality: N/A;   LEFT HEART CATH AND CORONARY  ANGIOGRAPHY N/A 11/08/2017   Procedure: LEFT HEART CATH AND CORONARY ANGIOGRAPHY;  Surgeon: Troy Sine, MD;  Location: [facility];  Service: Cardiovascular;  Laterality: N/A;   TEE WITHOUT CARDIOVERSION N/A 11/13/2017   Procedure: TRANSESOPHAGEAL ECHOCARDIOGRAM (TEE);  Surgeon: Grace Isaac, MD;  Location: [facility];  Service: Open Heart Surgery;  Laterality: N/A;    Current Medications: Current Outpatient Medications on File Prior to Visit  Medication Sig   atorvastatin (LIPITOR) 80 MG tablet TAKE 1 TABLET BY MOUTH ONCE DAILY AT  6PM   carvedilol (COREG) 3.125 MG tablet TAKE 1 TABLET BY MOUTH TWICE DAILY WITH A MEAL   cholecalciferol (VITAMIN D) 1000 UNITS tablet Take 1,000 Units by mouth daily.   diphenhydrAMINE (BENADRYL) 25 mg capsule Take 25 mg by mouth at bedtime as needed for sleep.   eplerenone (INSPRA) 25 MG tablet Take 1/2 (one-half) tablet by mouth once daily   ezetimibe (ZETIA) 10 MG tablet Take 1 tablet (10 mg total) by mouth daily.   finasteride (PROSCAR) 5 MG tablet Take 5 mg by mouth daily.   losartan (COZAAR) 25 MG tablet TAKE 1 TABLET BY MOUTH ONCE DAILY AT BEDTIME   No current facility-administered medications on file prior to visit.     Allergies:   Patient has no known allergies.   Social History   Tobacco Use   Smoking status: Never   Smokeless tobacco: Never    Family History: The patient's family history includes Heart disease in his maternal uncle and  paternal uncle. mat Gpa died age 27 of MI, mat Gma died in her 40s (unknown), pat Gpa died age 30, pat Gma died in her 59s. Pat uncle died at 56 of MI, other 6 uncles no heart disease (one lived to 18). Brother and sister in good health overall. No children.   ROS:   Please see the history of present illness.    Additional pertinent ROS negative except as noted.   EKGs/Labs/Other Studies Reviewed:    The following studies were reviewed today:  Cath 10/2017 Dr. Claiborne Billings  Prox LAD lesion is  85% stenosed. Ost 1st Diag lesion is 85% stenosed. Ost 1st Sept lesion is 95% stenosed. Mid LAD lesion is 85% stenosed. Ost 1st Diag to 1st Diag lesion is 70% stenosed. Dist LM to Ost LAD lesion is 60% stenosed. Ost Cx lesion is 65% stenosed. Prox Cx lesion is 50% stenosed. Mid Cx lesion is 50% stenosed. Mid RCA lesion is 60% stenosed. Prox RCA to Mid RCA lesion is 70% stenosed. Dist RCA lesion is 90% stenosed. LV end diastolic pressure is normal. There is moderate to severe left ventricular systolic dysfunction.   Acute coronary syndrome/STEMI complicated by VF cardiac arrest during EMS transfer to [facility] hospital, treated probably with CPR/multiple defibrillations with restoration of sinus rhythm.   Significant cardiac calcification and multivessel CAD with 60% ostial LAD stenosis, complex trifurcation stenosis of the proximal LAD, diagonal and septal perforating artery of 85-90, 95%, followed by diffuse 85% stenosis in the mid LAD after a sharp angle in the vessel with 70% mid diagonal stenosis; 60-70% ostial stenosis of the circumflex vessel followed by 40-50% proximal stenosis and 40-50% distal stenosis after distal marginal branch in a co-dominant circumflex system; and diffusely diseased mid RCA with narrowing of 60-70% with 90% distal RCA stenosis.   Moderately severe LV function with an EF of 35% with hypocontractility and distal anterolateral wall and inferoapical to distal inferior hypokinesis.   RECOMMENDATION: There is TIMI-3 flow presently in all vessels.  With significant coronary calcification and complex anatomy of the LAD with multivessel CAD, including ostial disease of the LAD and circumflex, will continue Aggrastat for at least 18 hours and obtain surgical consultation in a.m. for consideration of CABG revascularization surgery.  Eill ask angiograms to  be reviewed by colleagues.  Heparin will be restarted.  The patient was started on IV amiodarone.  During the procedure and  will continue with infusion.  High potency statin therapy.  Echo 10/2017: Study Conclusions   - Left ventricle: The cavity size was normal. Wall thickness was   increased in a pattern of mild LVH. Systolic function was mildly   to moderately reduced. The estimated ejection fraction was in the   range of 40% to 45%. Mid to distal anterior, apical and   inferoapical hypokinesis. Doppler parameters are consistent with   abnormal left ventricular relaxation (grade 1 diastolic   dysfunction). The E/e&' ratio is between 8-15, suggesting   indeterminate LV filling pressure. - Aortic valve: Trileaflet. Sclerosis without stenosis. There was   trivial regurgitation. - Mitral valve: Mildly thickened leaflets and thickening of the   subvalvular apparatus. There was trivial regurgitation. - Left atrium: Moderately dilated. - Tricuspid valve: There was trivial regurgitation. - Pulmonary arteries: PA peak pressure: 25 mm Hg (S). - Systemic veins: The IVC measures <2.1 cm, but does not collapse   >50%, suggesting an elevated RA pressure of 8 mmHg.   Impressions:   - LVEF 40-45%, mild LVH, mid  to distal anterior, apical and   inferoapical severe hypokinesis suggestive of LAD territory   ischemia/infarct, grade 1 DD, indeterminate LV filing pressure,   aortic valve sclerosis with trivial AI, trivial MR, moderae LAE,   trivial TR, RVSP 25 mmHg, elevated RA pressure of 8 mmHg (Est.).  Echo 01/2018: Study Conclusions   - Left ventricle: The cavity size was normal. There was moderate   concentric hypertrophy. Systolic function was normal. The   estimated ejection fraction was in the range of 50% to 55%. Wall   motion was normal; there were no regional wall motion   abnormalities. Doppler parameters are consistent with abnormal   left ventricular relaxation (grade 1 diastolic dysfunction).   There was no evidence of elevated ventricular filling pressure by   Doppler parameters. - Aortic valve:  Trileaflet; moderately thickened, moderately   calcified leaflets. Valve mobility was restricted. There was mild   regurgitation. - Mitral valve: There was mild regurgitation. - Right ventricle: The cavity size was mildly dilated. Wall   thickness was normal. Systolic function was normal. - Right atrium: The atrium was normal in size. - Tricuspid valve: There was mild regurgitation. - Pulmonary arteries: Systolic pressure was within the normal   range. - Inferior vena cava: The vessel was normal in size. The   respirophasic diameter changes were in the normal range (= 50%),   consistent with normal central venous pressure. - Pericardium, extracardiac: There was no pericardial effusion.   Impressions:   - Normal global longitudinal strain: -19.9 %. Lateral S prime 10   cm/s.  EKG:  EKG is personally reviewed.   11/11/22: The EKG was not ordered. 05/23/22: sinus bradycardia at 58 bpm, RBBB 04/26/2021: sinus bradycardia at 58 bpm, RBBB 09/22/2019: sinus bradycardia, IVCD  Recent Labs: No results found for requested labs within last 365 days.   Recent Lipid Panel    Component Value Date/Time   CHOL 170 08/05/2018 1014   TRIG 95 08/05/2018 1014   HDL 41 08/05/2018 1014   CHOLHDL 3.7 11/10/2017 0550   VLDL 15 11/10/2017 0550   LDLCALC 110 (H) 08/05/2018 1014    Physical Exam:    VS:  BP 130/80   Pulse (!) 56   Ht 6' (1.829 m)   Wt 170 lb (77.1 kg)   BMI 23.06 kg/m     Wt Readings from Last 3 Encounters:  11/11/22 170 lb (77.1 kg)  05/23/22 168 lb (76.2 kg)  04/26/21 167 lb 3.2 oz (75.8 kg)    GEN: Well nourished, well developed in no acute distress HEENT: Normal, moist mucous membranes NECK: No JVD CARDIAC: regular rhythm, normal S1 and S2, no rubs or gallops. 1/6 systolic murmur.  VASCULAR: Radial and DP pulses 2+ bilaterally. No carotid bruits RESPIRATORY:  Clear to auscultation without rales, wheezing or rhonchi  ABDOMEN: Soft, non-tender,  non-distended MUSCULOSKELETAL:  Ambulates independently SKIN: Warm and dry, no edema NEUROLOGIC:  Alert and oriented x 3. No focal neuro deficits noted. PSYCHIATRIC:  Normal affect    ASSESSMENT:    1. Coronary artery disease involving native coronary artery of native heart without angina pectoris   2. S/P CABG x 5   3. Hyperlipidemia LDL goal <70   4. Essential hypertension   5. Cardiac risk counseling       PLAN:    CAD: complicated by STEMI/VF arrest 10/2017 treated with 5V CABG (Dr. Servando Snare) LIMA-LAD, rSVG-Diag, sequential rSVG-OM1-dLCx, rSVG-dRCA -initially low EF, recovered post revascularization -on aspirin 81 mg--stopped on  his own but amenable to restarting -on atorvastatin 80 mg. Goal LDL <70, labs in care everywhere reviewed. Recent labs are excellent and at goal, much improved from prior -on carvedilol, losartan -on eplerenone (gynecomastia on spironolactone) -no angina   Hypertension: at goal today -carvedilol, eplerenone, losartan as above   Hyperlipidemia: goal LDL <70. -was not previously at goal on max dose atorvastatin (LDL 110). Saw lipid clinic for PCSK9i, but he cannot afford. Bempedoic acid not covered on his plan. Lyndel Pleasure is covered. -had significant improvement with addition of ezetimibe, at goal, continue    Secondary prevention: -recommend heart healthy/Mediterranean diet, with whole grains, fruits, vegetable, fish, lean meats, nuts, and olive oil. Limit salt. -recommend moderate walking, 3-5 times/week for 30-50 minutes each session. Aim for at least 150 minutes.week. Goal should be pace of 3 miles/hours, or walking 1.5 miles in 30 minutes -recommend avoidance of tobacco products. Avoid excess alcohol.  Plan for follow up: 1 year  Medication Adjustments/Labs and Tests Ordered: Current medicines are reviewed at length with the patient today.  Concerns regarding medicines are outlined above.   No orders of the defined types were placed in  this encounter.   Meds ordered this encounter  Medications   aspirin EC 81 MG tablet    Sig: Take 1 tablet (81 mg total) by mouth daily. Swallow whole.    Dispense:  90 tablet    Refill:  3   DISCONTD: aspirin EC 81 MG tablet    Sig: Take 1 tablet (81 mg total) by mouth daily. Swallow whole.    Dispense:  90 tablet    Refill:  3    Patient Instructions  Medication Instructions:  Start taking Aspirin  *If you need a refill on your cardiac medications before your next appointment, please call your pharmacy*   Lab Work: None     Testing/Procedures: None   Follow-Up: At [facility], you and your health needs are our priority.  As part of our continuing mission to provide you with exceptional heart care, we have created designated Provider Care Teams.  These Care Teams include your primary Cardiologist (physician) and Advanced Practice Providers (APPs -  Physician Assistants and Nurse Practitioners) who all work together to provide you with the care you need, when you need it.  We recommend signing up for the patient portal called "MyChart".  Sign up information is provided on this After Visit Summary.  MyChart is used to connect with patients for Virtual Visits (Telemedicine).  Patients are able to view lab/test results, encounter notes, upcoming appointments, etc.  Non-urgent messages can be sent to your provider as well.   To learn more about what you can do with MyChart, go to [email or web address].    Your next appointment:   1 year(s)  Provider:   Buford Dresser, MD    Other Instructions None    I,Coren O'Brien,acting as a scribe for Buford Dresser, MD.,have documented all relevant documentation on the behalf of Buford Dresser, MD,as directed by  Buford Dresser, MD while in the presence of Buford Dresser, MD.  I, Buford Dresser, MD, have reviewed all documentation for this visit. The documentation on 11/11/22 for  the exam, diagnosis, procedures, and orders are all accurate and complete.

## 2022-12-25 ENCOUNTER — Other Ambulatory Visit: Payer: Self-pay | Admitting: *Deleted

## 2022-12-25 MED ORDER — ATORVASTATIN CALCIUM 80 MG PO TABS: ORAL_TABLET | ORAL | 3 refills | Status: DC

## 2023-01-27 ENCOUNTER — Other Ambulatory Visit: Payer: Self-pay | Admitting: Cardiology

## 2023-01-27 DIAGNOSIS — I255 Ischemic cardiomyopathy: Secondary | ICD-10-CM

## 2023-01-27 DIAGNOSIS — I10 Essential (primary) hypertension: Secondary | ICD-10-CM

## 2023-02-25 ENCOUNTER — Other Ambulatory Visit: Payer: Self-pay | Admitting: Cardiology

## 2023-02-25 NOTE — Telephone Encounter (Signed)
Rx(s) sent to pharmacy electronically.  

## 2023-06-10 ENCOUNTER — Other Ambulatory Visit (HOSPITAL_BASED_OUTPATIENT_CLINIC_OR_DEPARTMENT_OTHER): Payer: Self-pay | Admitting: Cardiology

## 2023-06-10 DIAGNOSIS — I2510 Atherosclerotic heart disease of native coronary artery without angina pectoris: Secondary | ICD-10-CM

## 2023-06-10 DIAGNOSIS — E785 Hyperlipidemia, unspecified: Secondary | ICD-10-CM

## 2023-07-06 ENCOUNTER — Telehealth: Payer: Self-pay | Admitting: *Deleted

## 2023-07-06 NOTE — Telephone Encounter (Signed)
   Pre-operative Risk Assessment    Patient Name: Edwin Morris  DOB: 1946/05/19 MRN: [id number]      Request for Surgical Clearance    Procedure:   Laparoscopic inguinal hernia repair  Date of Surgery:  Clearance TBD                                 Surgeon:  Dr. Axel Filler Surgeon's Group or Practice Name:  [facility] Phone number:  [phone] Fax number:  [phone]   Type of Clearance Requested:   - Medical  - Pharmacy:  Hold Aspirin Not Indicated   Type of Anesthesia:  General    Additional requests/questions:   Last OV: Dr. Cristal Deer 11/11/2022 Upcoming OV: None  Signed, Emmit Pomfret   07/06/2023, 12:35 PM

## 2023-07-07 ENCOUNTER — Telehealth: Payer: Self-pay | Admitting: *Deleted

## 2023-07-07 NOTE — Telephone Encounter (Signed)
S/w the pt and he has been scheduled for tele pre op appt 08/24/23 as pt tells me he is not having procedure until about the 1st week or so of Jan 2025. Med rec and consent are done.

## 2023-07-07 NOTE — Telephone Encounter (Signed)
   Name: Edwin Morris  DOB: [birth year]  MRN: [id number]  Primary Cardiologist: Jodelle Red, MD   Preoperative team, please contact this patient and set up a phone call appointment for further preoperative risk assessment. Please obtain consent and complete medication review. Thank you for your help.  I confirm that guidance regarding antiplatelet and oral anticoagulation therapy has been completed and, if necessary, noted below.  Ideally aspirin should be continued without interruption, however if the bleeding risk is too great, aspirin may be held for 5-7 days prior to surgery. Please resume aspirin post operatively when it is felt to be safe from a bleeding standpoint.    I also confirmed the patient resides in the state of [place]. As per [facility] telemedicine laws, the patient must reside in the state in which the provider is licensed.   Carlos Levering, NP 07/07/2023, 12:50 PM [facility]

## 2023-07-07 NOTE — Telephone Encounter (Signed)
S/w the pt and he has been scheduled for tele pre op appt 08/24/23 as pt tells me he is not having procedure until about the 1st week or so of Jan 2025. Med rec and consent are done.      Patient Consent for Virtual Visit        Edwin Morris has provided verbal consent on 07/07/2023 for a virtual visit (video or telephone).   CONSENT FOR VIRTUAL VISIT FOR:  Edwin Morris  By participating in this virtual visit I agree to the following:  I hereby voluntarily request, consent and authorize [facility] and its employed or contracted physicians, physician assistants, nurse practitioners or other licensed health care professionals (the Practitioner), to provide me with telemedicine health care services (the "Services") as deemed necessary by the treating Practitioner. I acknowledge and consent to receive the Services by the Practitioner via telemedicine. I understand that the telemedicine visit will involve communicating with the Practitioner through live audiovisual communication technology and the disclosure of certain medical information by electronic transmission. I acknowledge that I have been given the opportunity to request an in-person assessment or other available alternative prior to the telemedicine visit and am voluntarily participating in the telemedicine visit.  I understand that I have the right to withhold or withdraw my consent to the use of telemedicine in the course of my care at any time, without affecting my right to future care or treatment, and that the Practitioner or I may terminate the telemedicine visit at any time. I understand that I have the right to inspect all information obtained and/or recorded in the course of the telemedicine visit and may receive copies of available information for a reasonable fee.  I understand that some of the potential risks of receiving the Services via telemedicine include:  Delay or interruption in medical evaluation due to  technological equipment failure or disruption; Information transmitted may not be sufficient (e.g. poor resolution of images) to allow for appropriate medical decision making by the Practitioner; and/or  In rare instances, security protocols could fail, causing a breach of personal health information.  Furthermore, I acknowledge that it is my responsibility to provide information about my medical history, conditions and care that is complete and accurate to the best of my ability. I acknowledge that Practitioner's advice, recommendations, and/or decision may be based on factors not within their control, such as incomplete or inaccurate data provided by me or distortions of diagnostic images or specimens that may result from electronic transmissions. I understand that the practice of medicine is not an exact science and that Practitioner makes no warranties or guarantees regarding treatment outcomes. I acknowledge that a copy of this consent can be made available to me via my patient portal [facility] MyChart), or I can request a printed copy by calling the office of [facility].    I understand that my insurance will be billed for this visit.   I have read or had this consent read to me. I understand the contents of this consent, which adequately explains the benefits and risks of the Services being provided via telemedicine.  I have been provided ample opportunity to ask questions regarding this consent and the Services and have had my questions answered to my satisfaction. I give my informed consent for the services to be provided through the use of telemedicine in my medical care

## 2023-08-24 ENCOUNTER — Ambulatory Visit: Payer: Medicare HMO | Attending: Cardiology | Admitting: Emergency Medicine

## 2023-08-24 DIAGNOSIS — Z01810 Encounter for preprocedural cardiovascular examination: Secondary | ICD-10-CM

## 2023-08-24 NOTE — Progress Notes (Signed)
Virtual Visit via Telephone Note   Because of Edwin Morris's co-morbid illnesses, he is at least at moderate risk for complications without adequate follow up.  This format is felt to be most appropriate for this patient at this time.  The patient did not have access to video technology/had technical difficulties with video requiring transitioning to audio format only (telephone).  All issues noted in this document were discussed and addressed.  No physical exam could be performed with this format.  Please refer to the patient's chart for his consent to telehealth for [facility].  Evaluation Performed:  Preoperative cardiovascular risk assessment _____________   Date:  08/24/2023   Patient ID:  Edwin Morris, DOB May 15, 1946, MRN [id number] Patient Location:  Home Provider location:   Office  Primary Care Provider:  Tracey Harries, MD Primary Cardiologist:  Jodelle Red, MD  Chief Complaint / Patient Profile   77 y.o. y/o male with a h/o VF arrest 10/2017 found to have STEMI, s/p CABG due to severe multivessel CAD, ischemic cardiomyopathy, HTN, HLD who is pending laparoscopic inguinal hernia repair with Dr. Derrell Lolling at [facility] and presents today for telephonic preoperative cardiovascular risk assessment.  History of Present Illness    Edwin Morris is a 77 y.o. male who presents via audio/video conferencing for a telehealth visit today.  Pt was last seen in cardiology clinic on 11/11/2022 by Dr. Cristal Deer.  At that time Edwin Morris was doing well.  The patient is now pending procedure as outlined above. Since his last visit, he  denies chest pain, shortness of breath, lower extremity edema, fatigue, palpitations, melena, hematuria, hemoptysis, diaphoresis, weakness, presyncope, syncope, orthopnea, and PND.  Past Medical History    Past Medical History:  Diagnosis Date   BPH (benign prostatic hyperplasia)    Hyperlipidemia    MI  (myocardial infarction) (HCC)    NHL (non-Hodgkin's lymphoma) (HCC)    nhl dx 10/2008   Past Surgical History:  Procedure Laterality Date   CORONARY ARTERY BYPASS GRAFT N/A 11/13/2017   Procedure: CORONARY ARTERY BYPASS GRAFTING (CABG) times 5 using left internal mammary artery and right saphenous vein. (LIMA to LAD, SVG to DIAGONAL, SVG SEQUENTIALLY to DISTAL CIRCUMFLEX and OM1, SVG to RCA);  Surgeon: Delight Ovens, MD;  Location: [facility] OR;  Service: Open Heart Surgery;  Laterality: N/A;   LEFT HEART CATH AND CORONARY ANGIOGRAPHY N/A 11/08/2017   Procedure: LEFT HEART CATH AND CORONARY ANGIOGRAPHY;  Surgeon: Lennette Bihari, MD;  Location: [facility] INVASIVE CV LAB;  Service: Cardiovascular;  Laterality: N/A;   TEE WITHOUT CARDIOVERSION N/A 11/13/2017   Procedure: TRANSESOPHAGEAL ECHOCARDIOGRAM (TEE);  Surgeon: Delight Ovens, MD;  Location: [facility] OR;  Service: Open Heart Surgery;  Laterality: N/A;    Allergies  No Known Allergies  Home Medications    Prior to Admission medications   Medication Sig Start Date End Date Taking? Authorizing Provider  aspirin EC 81 MG tablet Take 1 tablet (81 mg total) by mouth daily. Swallow whole. 11/11/22   Jodelle Red, MD  atorvastatin (LIPITOR) 80 MG tablet TAKE 1 TABLET BY MOUTH ONCE DAILY AT  [place] [facility] 12/25/22   Jodelle Red, MD  carvedilol (COREG) 3.125 MG tablet TAKE 1 TABLET BY MOUTH TWICE DAILY WITH A MEAL 02/25/23   Jodelle Red, MD  cholecalciferol (VITAMIN D) 1000 UNITS tablet Take 1,000 Units by mouth daily.    [provider]  diphenhydrAMINE (BENADRYL) 25 mg capsule Take 25 mg by mouth  at bedtime as needed for sleep.    [provider]  eplerenone (INSPRA) 25 MG tablet Take 1/2 (one-half) tablet by mouth once daily 01/27/23   Jodelle Red, MD  ezetimibe (ZETIA) 10 MG tablet Take 1 tablet by mouth once daily 06/10/23   Jodelle Red, MD  finasteride (PROSCAR) 5 MG tablet Take 5 mg by mouth  daily.    [provider]  losartan (COZAAR) 25 MG tablet TAKE 1 TABLET BY MOUTH ONCE DAILY AT BEDTIME 01/27/23   Jodelle Red, MD    Physical Exam    Vital Signs:  Edwin Morris does not have vital signs available for review today.  Given telephonic nature of communication, physical exam is limited. AAOx3. NAD. Normal affect.  Speech and respirations are unlabored.  Accessory Clinical Findings    None  Assessment & Plan    1.  Preoperative Cardiovascular Risk Assessment: According to the Revised Cardiac Risk Index (RCRI), his Perioperative Risk of Major Cardiac Event is (%): 6.6. His Functional Capacity in METs is: 9.25 according to the Duke Activity Status Index (DASI). Therefore, based on ACC/AHA guidelines, patient would be at acceptable risk for the planned procedure without further cardiovascular testing. I will route this recommendation to the requesting party via Epic fax function.   The patient was advised that if he develops new symptoms prior to surgery to contact our office to arrange for a follow-up visit, and he verbalized understanding.  Ideally aspirin should be continued without interruption, however if the bleeding risk is too great, aspirin may be held for 5-7 days prior to surgery. Please resume aspirin post operatively when it is felt to be safe from a bleeding standpoint.   A copy of this note will be routed to requesting surgeon.  Time:   Today, I have spent 6 minutes with the patient with telehealth technology discussing medical history, symptoms, and management plan.     Denyce Robert, NP  08/24/2023, 7:23 AM

## 2023-09-10 ENCOUNTER — Ambulatory Visit: Payer: Self-pay | Admitting: General Surgery

## 2023-09-10 NOTE — H&P (View-Only) (Signed)
 Chief Complaint: New Consultation and Inguinal Hernia   History of Present Illness: Edwin Morris is a 78 y.o. male who is seen today as an office consultation at the request of Dr. Cam for evaluation of New Consultation and Inguinal Hernia .   Patient is a 78 year old male, with a history of CAD status post bypass. Patient sees Dr. Lonni his cardiologist. Patient comes in with a left inguinal hernia that has been bothering for the last month. According to him his wife is at this area for a longer amount of time and is unsure how long. He states that has been noticing some pain discomfort to the right side.  Patient had no signs or symptoms of incarceration or strangulation.  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  He had no previous abdominal surgery.  Review of Systems: A complete review of systems was obtained from the patient. I have reviewed this information and discussed as appropriate with the patient. See HPI as well for other ROS.  Review of Systems  Constitutional: Negative for fever.  HENT: Negative for congestion.  Eyes: Negative for blurred vision.  Respiratory: Negative for cough, shortness of breath and wheezing.  Cardiovascular: Negative for chest pain and palpitations.  Gastrointestinal: Negative for heartburn.  Genitourinary: Negative for dysuria.  Musculoskeletal: Negative for myalgias.  Skin: Negative for rash.  Neurological: Negative for dizziness and headaches.  Psychiatric/Behavioral: Negative for depression and suicidal ideas.  All other systems reviewed and are negative.   Medical History: Past Medical History:  Diagnosis Date  Arthritis  CHF (congestive heart failure) (CMS/HHS-HCC)  History of cancer   There is no problem list on file for this patient.  Past Surgical History:  Procedure Laterality Date  CRANIOTOMY EXPLORATORY  heart surgery    No Known Allergies  Current Outpatient Medications on File  Prior to Visit  Medication Sig Dispense Refill  aspirin  81 MG EC tablet Take 81 mg by mouth once daily  atorvastatin  (LIPITOR ) 80 MG tablet TAKE 1 TABLET BY MOUTH ONCE DAILY AT 6PM  carvediloL  (COREG ) 3.125 MG tablet Take 3.125 mg by mouth 2 (two) times daily with meals  eplerenone  (INSPRA ) 25 MG tablet Take 12.5 mg by mouth once daily  ezetimibe  (ZETIA ) 10 mg tablet Take 1 tablet by mouth once daily  finasteride  (PROSCAR ) 5 mg tablet Take 5 mg by mouth once daily  losartan  (COZAAR ) 25 MG tablet Take 25 mg by mouth at bedtime   No current facility-administered medications on file prior to visit.   History reviewed. No pertinent family history.   Social History   Tobacco Use  Smoking Status Unknown  Smokeless Tobacco Not on file    Social History   Socioeconomic History  Marital status: Married  Tobacco Use  Smoking status: Unknown   Social Drivers of Health   Financial Resource Strain: Low Risk (09/10/2022)  Received from [facility]  Overall Financial Resource Strain (CARDIA)  Difficulty of Paying Living Expenses: Not hard at all  Food Insecurity: No Food Insecurity (09/10/2022)  Received from [facility]  Hunger Vital Sign  Worried About Running Out of Food in the Last Year: Never true  Ran Out of Food in the Last Year: Never true  Transportation Needs: No Transportation Needs (09/10/2022)  Received from [facility] - Transportation  Lack of Transportation (Medical): No  Lack of Transportation (Non-Medical): No  Physical Activity: Insufficiently Active (09/10/2022)  Received from [facility]  Exercise Vital Sign  Days of Exercise per Week: 2 days  Minutes of Exercise per Session: 40 min  Stress: No Stress Concern Present (09/10/2022)  Received from [facility] - Occupational Stress Questionnaire  Feeling of Stress : Not at all  Social Connections: Socially Integrated (09/10/2022)  Received from [facility]  Social Network  How would you rate your social network (family, work, friends)?: Good participation with social networks  Housing Stability: Low Risk (09/10/2022)  Received from [facility] Stability Vital Sign  Unable to Pay for Housing in the Last Year: No  Number of Places Lived in the Last Year: 1  In the last 12 months, was there a time when you did not have a steady place to sleep or slept in a shelter (including now)?: No   Objective:   Vitals:  07/06/23 1054  BP: (!) 143/78  Pulse: 66  Temp: 36.7 C (98 F)  SpO2: 98%  Weight: 77.6 kg (171 lb)  Height: 182.9 cm (6')   Body mass index is 23.19 kg/m. Physical Exam Constitutional:  Appearance: Normal appearance.  HENT:  Head: Normocephalic and atraumatic.  Nose: Nose normal. No congestion.  Mouth/Throat:  Mouth: Mucous membranes are moist.  Pharynx: Oropharynx is clear.  Eyes:  Pupils: Pupils are equal, round, and reactive to light.  Cardiovascular:  Rate and Rhythm: Normal rate and regular rhythm.  Pulses: Normal pulses.  Heart sounds: Normal heart sounds. No murmur heard. No friction rub. No gallop.  Pulmonary:  Effort: Pulmonary effort is normal. No respiratory distress.  Breath sounds: Normal breath sounds. No stridor. No wheezing, rhonchi or rales.  Abdominal:  General: Abdomen is flat.  Hernia: A hernia is present. Hernia is present in the left inguinal area and right inguinal area.  Musculoskeletal:  General: Normal range of motion.  Cervical back: Normal range of motion.  Skin: General: Skin is warm and dry.  Neurological:  General: No focal deficit present.  Mental Status: He is alert and oriented to person, place, and time.  Psychiatric:  Mood and Affect: Mood normal.  Thought Content: Thought content normal.     Assessment and Plan:  Diagnoses and all orders for this visit:  Bilateral inguinal hernia without obstruction or gangrene, recurrence not specified   Edwin Morris is a 78 y.o. male   We will proceed to the OR for a laparoscopic bilateral inguinal hernia repair with mesh. All risks and benefits were discussed with the patient, to generally include infection, bleeding, damage to surrounding structures, acute and chronic nerve pain, and recurrence. Alternatives were offered and described. All questions were answered and the patient voiced understanding of the procedure and wishes to proceed at this point.  No follow-ups on file.  Lynda Leos, MD, [facility], [place] [facility]

## 2023-09-10 NOTE — Progress Notes (Signed)
 Surgical Instructions   Your procedure is scheduled on Tuesday, January 14th, 2025. Report to [facility] Main Entrance A at 7:30 A.M., then check in with the Admitting office. Any questions or running late day of surgery: call [phone]  Questions prior to your surgery date: call [phone], Monday-Friday, 8am-4pm. If you experience any cold or flu symptoms such as cough, fever, chills, shortness of breath, etc. between now and your scheduled surgery, please notify us  at the above number.     Remember:  Do not eat after midnight the night before your surgery   You may drink clear liquids until 6:30 the morning of your surgery.   Clear liquids allowed are: Water, Non-Citrus Juices (without pulp), Carbonated Beverages, Clear Tea (no milk, honey, etc.), Black Coffee Only (NO MILK, CREAM OR POWDERED CREAMER of any kind), and Gatorade.    Take these medicines the morning of surgery with A SIP OF WATER: Atorvastatin  (Lipitor ) Carvedilol  (Coreg ) Finasteride  (Proscar )   May take these medicines IF NEEDED: None.    Per your surgeon's instructions, hold Aspirin  for 5 days prior to surgery.    One week prior to surgery, STOP taking any Aspirin  (unless otherwise instructed by your surgeon) Aleve, Naproxen, Ibuprofen, Motrin, Advil, Goody's, BC's, all herbal medications, fish oil, and non-prescription vitamins.                     Do NOT Smoke (Tobacco/Vaping) for 24 hours prior to your procedure.  If you use a CPAP at night, you may bring your mask/headgear for your overnight stay.   You will be asked to remove any contacts, glasses, piercing's, hearing aid's, dentures/partials prior to surgery. Please bring cases for these items if needed.    Patients discharged the day of surgery will not be allowed to drive home, and someone needs to stay with them for 24 hours.  SURGICAL WAITING ROOM VISITATION Patients may have no more than 2 support people in the waiting area - these  visitors may rotate.   Pre-op nurse will coordinate an appropriate time for 1 ADULT support person, who may not rotate, to accompany patient in pre-op.  Children under the age of 37 must have an adult with them who is not the patient and must remain in the main waiting area with an adult.  If the patient needs to stay at the hospital during part of their recovery, the visitor guidelines for inpatient rooms apply.  Please refer to the [facility] website for the visitor guidelines for any additional information.   If you received a COVID test during your pre-op visit  it is requested that you wear a mask when out in public, stay away from anyone that may not be feeling well and notify your surgeon if you develop symptoms. If you have been in contact with anyone that has tested positive in the last 10 days please notify you surgeon.      Pre-operative CHG Bathing Instructions   You can play a key role in reducing the risk of infection after surgery. Your skin needs to be as free of germs as possible. You can reduce the number of germs on your skin by washing with CHG (chlorhexidine  gluconate) soap before surgery. CHG is an antiseptic soap that kills germs and continues to kill germs even after washing.   DO NOT use if you have an allergy to chlorhexidine /CHG or antibacterial soaps. If your skin becomes reddened or irritated, stop using the CHG and notify one  of our RNs at [phone].              TAKE A SHOWER THE NIGHT BEFORE SURGERY AND THE DAY OF SURGERY    Please keep in mind the following:  DO NOT shave, including legs and underarms, 48 hours prior to surgery.   You may shave your face before/day of surgery.  Place clean sheets on your bed the night before surgery Use a clean washcloth (not used since being washed) for each shower. DO NOT sleep with pet's night before surgery.  CHG Shower Instructions:  Wash your face and private area with normal soap. If you choose to wash your  hair, wash first with your normal shampoo.  After you use shampoo/soap, rinse your hair and body thoroughly to remove shampoo/soap residue.  Turn the water OFF and apply half the bottle of CHG soap to a CLEAN washcloth.  Apply CHG soap ONLY FROM YOUR NECK DOWN TO YOUR TOES (washing for 3-5 minutes)  DO NOT use CHG soap on face, private areas, open wounds, or sores.  Pay special attention to the area where your surgery is being performed.  If you are having back surgery, having someone wash your back for you may be helpful. Wait 2 minutes after CHG soap is applied, then you may rinse off the CHG soap.  Pat dry with a clean towel  Put on clean pajamas    Additional instructions for the day of surgery: DO NOT APPLY any lotions, deodorants, cologne, or perfumes.   Do not wear jewelry or makeup Do not wear nail polish, gel polish, artificial nails, or any other type of covering on natural nails (fingers and toes) Do not bring valuables to the hospital. [facility] is not responsible for valuables/personal belongings. Put on clean/comfortable clothes.  Please brush your teeth.  Ask your nurse before applying any prescription medications to the skin.

## 2023-09-10 NOTE — H&P (Signed)
 Chief Complaint: New Consultation and Inguinal Hernia   History of Present Illness: Edwin Morris is a 78 y.o. male who is seen today as an office consultation at the request of Dr. Cam for evaluation of New Consultation and Inguinal Hernia .   Patient is a 78 year old male, with a history of CAD status post bypass. Patient sees Dr. Lonni his cardiologist. Patient comes in with a left inguinal hernia that has been bothering for the last month. According to him his wife is at this area for a longer amount of time and is unsure how long. He states that has been noticing some pain discomfort to the right side.  Patient had no signs or symptoms of incarceration or strangulation.  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  He had no previous abdominal surgery.  Review of Systems: A complete review of systems was obtained from the patient. I have reviewed this information and discussed as appropriate with the patient. See HPI as well for other ROS.  Review of Systems  Constitutional: Negative for fever.  HENT: Negative for congestion.  Eyes: Negative for blurred vision.  Respiratory: Negative for cough, shortness of breath and wheezing.  Cardiovascular: Negative for chest pain and palpitations.  Gastrointestinal: Negative for heartburn.  Genitourinary: Negative for dysuria.  Musculoskeletal: Negative for myalgias.  Skin: Negative for rash.  Neurological: Negative for dizziness and headaches.  Psychiatric/Behavioral: Negative for depression and suicidal ideas.  All other systems reviewed and are negative.   Medical History: Past Medical History:  Diagnosis Date  Arthritis  CHF (congestive heart failure) (CMS/HHS-HCC)  History of cancer   There is no problem list on file for this patient.  Past Surgical History:  Procedure Laterality Date  CRANIOTOMY EXPLORATORY  heart surgery    No Known Allergies  Current Outpatient Medications on File  Prior to Visit  Medication Sig Dispense Refill  aspirin  81 MG EC tablet Take 81 mg by mouth once daily  atorvastatin  (LIPITOR ) 80 MG tablet TAKE 1 TABLET BY MOUTH ONCE DAILY AT 6PM  carvediloL  (COREG ) 3.125 MG tablet Take 3.125 mg by mouth 2 (two) times daily with meals  eplerenone  (INSPRA ) 25 MG tablet Take 12.5 mg by mouth once daily  ezetimibe  (ZETIA ) 10 mg tablet Take 1 tablet by mouth once daily  finasteride  (PROSCAR ) 5 mg tablet Take 5 mg by mouth once daily  losartan  (COZAAR ) 25 MG tablet Take 25 mg by mouth at bedtime   No current facility-administered medications on file prior to visit.   History reviewed. No pertinent family history.   Social History   Tobacco Use  Smoking Status Unknown  Smokeless Tobacco Not on file    Social History   Socioeconomic History  Marital status: Married  Tobacco Use  Smoking status: Unknown   Social Drivers of Health   Financial Resource Strain: Low Risk (09/10/2022)  Received from [facility]  Overall Financial Resource Strain (CARDIA)  Difficulty of Paying Living Expenses: Not hard at all  Food Insecurity: No Food Insecurity (09/10/2022)  Received from [facility]  Hunger Vital Sign  Worried About Running Out of Food in the Last Year: Never true  Ran Out of Food in the Last Year: Never true  Transportation Needs: No Transportation Needs (09/10/2022)  Received from [facility] - Transportation  Lack of Transportation (Medical): No  Lack of Transportation (Non-Medical): No  Physical Activity: Insufficiently Active (09/10/2022)  Received from [facility]  Exercise Vital Sign  Days of Exercise per Week: 2 days  Minutes of Exercise per Session: 40 min  Stress: No Stress Concern Present (09/10/2022)  Received from [facility] - Occupational Stress Questionnaire  Feeling of Stress : Not at all  Social Connections: Socially Integrated (09/10/2022)  Received from [facility]  Social Network  How would you rate your social network (family, work, friends)?: Good participation with social networks  Housing Stability: Low Risk (09/10/2022)  Received from [facility] Stability Vital Sign  Unable to Pay for Housing in the Last Year: No  Number of Places Lived in the Last Year: 1  In the last 12 months, was there a time when you did not have a steady place to sleep or slept in a shelter (including now)?: No   Objective:   Vitals:  07/06/23 1054  BP: (!) 143/78  Pulse: 66  Temp: 36.7 C (98 F)  SpO2: 98%  Weight: 77.6 kg (171 lb)  Height: 182.9 cm (6')   Body mass index is 23.19 kg/m. Physical Exam Constitutional:  Appearance: Normal appearance.  HENT:  Head: Normocephalic and atraumatic.  Nose: Nose normal. No congestion.  Mouth/Throat:  Mouth: Mucous membranes are moist.  Pharynx: Oropharynx is clear.  Eyes:  Pupils: Pupils are equal, round, and reactive to light.  Cardiovascular:  Rate and Rhythm: Normal rate and regular rhythm.  Pulses: Normal pulses.  Heart sounds: Normal heart sounds. No murmur heard. No friction rub. No gallop.  Pulmonary:  Effort: Pulmonary effort is normal. No respiratory distress.  Breath sounds: Normal breath sounds. No stridor. No wheezing, rhonchi or rales.  Abdominal:  General: Abdomen is flat.  Hernia: A hernia is present. Hernia is present in the left inguinal area and right inguinal area.  Musculoskeletal:  General: Normal range of motion.  Cervical back: Normal range of motion.  Skin: General: Skin is warm and dry.  Neurological:  General: No focal deficit present.  Mental Status: He is alert and oriented to person, place, and time.  Psychiatric:  Mood and Affect: Mood normal.  Thought Content: Thought content normal.     Assessment and Plan:  Diagnoses and all orders for this visit:  Bilateral inguinal hernia without obstruction or gangrene, recurrence not specified   Edwin Morris is a 78 y.o. male   We will proceed to the OR for a laparoscopic bilateral inguinal hernia repair with mesh. All risks and benefits were discussed with the patient, to generally include infection, bleeding, damage to surrounding structures, acute and chronic nerve pain, and recurrence. Alternatives were offered and described. All questions were answered and the patient voiced understanding of the procedure and wishes to proceed at this point.  No follow-ups on file.  Lynda Leos, MD, [facility] Surgery, [place] [facility]

## 2023-09-11 ENCOUNTER — Encounter (HOSPITAL_COMMUNITY)
Admission: RE | Admit: 2023-09-11 | Discharge: 2023-09-11 | Disposition: A | Discharge status: Home / Self Care | Payer: Medicare Other | Source: Ambulatory Visit | Attending: General Surgery | Admitting: General Surgery

## 2023-09-11 ENCOUNTER — Encounter (HOSPITAL_COMMUNITY): Payer: Self-pay

## 2023-09-11 ENCOUNTER — Other Ambulatory Visit: Payer: Self-pay

## 2023-09-11 VITALS — BP 128/66 | HR 64 | Temp 97.5°F | Resp 18 | Ht 72.0 in | Wt 171.3 lb

## 2023-09-11 DIAGNOSIS — Z01818 Encounter for other preprocedural examination: Secondary | ICD-10-CM | POA: Insufficient documentation

## 2023-09-11 DIAGNOSIS — I2510 Atherosclerotic heart disease of native coronary artery without angina pectoris: Secondary | ICD-10-CM | POA: Insufficient documentation

## 2023-09-11 DIAGNOSIS — I459 Conduction disorder, unspecified: Secondary | ICD-10-CM | POA: Insufficient documentation

## 2023-09-11 HISTORY — DX: Essential (primary) hypertension: I10

## 2023-09-11 HISTORY — DX: Headache, unspecified: R51.9

## 2023-09-11 HISTORY — DX: Atherosclerotic heart disease of native coronary artery without angina pectoris: I25.10

## 2023-09-11 HISTORY — DX: Unspecified osteoarthritis, unspecified site: M19.90

## 2023-09-11 HISTORY — DX: Bell's palsy: G51.0

## 2023-09-11 LAB — CBC
HCT: 47.3 % (ref 39.0–52.0)
Hemoglobin: 15.9 g/dL (ref 13.0–17.0)
MCH: 29.8 pg (ref 26.0–34.0)
MCHC: 33.6 g/dL (ref 30.0–36.0)
MCV: 88.6 fL (ref 80.0–100.0)
Platelets: 223 10*3/uL (ref 150–400)
RBC: 5.34 MIL/uL (ref 4.22–5.81)
RDW: 13.3 % (ref 11.5–15.5)
WBC: 5.1 10*3/uL (ref 4.0–10.5)
nRBC: 0 % (ref 0.0–0.2)

## 2023-09-11 LAB — BASIC METABOLIC PANEL
Anion gap: 7 (ref 5–15)
BUN: 17 mg/dL (ref 8–23)
CO2: 24 mmol/L (ref 22–32)
Calcium: 9.1 mg/dL (ref 8.9–10.3)
Chloride: 108 mmol/L (ref 98–111)
Creatinine, Ser: 0.73 mg/dL (ref 0.61–1.24)
GFR, Estimated: >60 mL/min (ref >60)
Glucose, Bld: 107 mg/dL — ABNORMAL HIGH (ref 70–99)
Potassium: 4.4 mmol/L (ref 3.5–5.1)
Sodium: 139 mmol/L (ref 135–145)

## 2023-09-11 NOTE — Pre-Procedure Instructions (Signed)
 Patient Instructions  The night before surgery:  No food after midnight. ONLY clear liquids after midnight  The day of surgery (if you do NOT have diabetes):  Drink ONE (1) Pre-Surgery Clear Ensure by 6:30 AM the morning of surgery. Drink in one sitting. Do not sip.  This drink was given to you during your hospital  pre-op appointment visit.  Nothing else to drink after completing the  Pre-Surgery Clear Ensure.         If you have questions, please contact your surgeon's office.

## 2023-09-11 NOTE — Progress Notes (Addendum)
 PCP - Dr. Alm Bilis Cardiologist - Dr. Shelda Bruckner - last office visit 11/11/2022  PPM/ICD - Denies Device Orders - n/a Rep Notified - n/a  Chest x-ray - n/a EKG - 09/11/2023 Stress Test - Per pt, many years ago ECHO - 02/04/2018 Cardiac Cath - 11/08/2017  Sleep Study - Denies CPAP - n/a  No DM  Last dose of GLP1 agonist- n/a   GLP1 instructions: n/a  Blood Thinner Instructions: n/a Aspirin  Instructions: Pt has already held ASA. Last dose was 09/07/23  ERAS Protcol - Clear liquids until 0630 morning of surgery PRE-SURGERY Ensure or G2- Ensure given to pt with instructions  COVID TEST- n/a   Anesthesia review: Yes. Cardiac Clearance (Hx. CAD s/p CABG, HTN, MI). Abnormal EKG review  Patient denies shortness of breath, fever, cough and chest pain at PAT appointment. Pt denies any respiratory illness/infection in the last two months.    All instructions explained to the patient, with a verbal understanding of the material. Patient agrees to go over the instructions while at home for a better understanding. Patient also instructed to self quarantine after being tested for COVID-19. The opportunity to ask questions was provided.

## 2023-09-14 NOTE — Anesthesia Preprocedure Evaluation (Addendum)
 Anesthesia Evaluation  Patient identified by MRN, date of birth, ID band Patient awake    Reviewed: Allergy & Precautions, H&P , NPO status , Patient's Chart, lab work & pertinent test results, reviewed documented beta blocker date and time   Airway Mallampati: II  TM Distance: >3 FB Neck ROM: Full    Dental no notable dental hx. (+) Teeth Intact, Dental Advisory Given   Pulmonary neg pulmonary ROS   Pulmonary exam normal breath sounds clear to auscultation       Cardiovascular hypertension, Pt. on home beta blockers and Pt. on medications + CAD, + Past MI and + CABG   Rhythm:Regular Rate:Normal     Neuro/Psych  Headaches  negative psych ROS   GI/Hepatic negative GI ROS, Neg liver ROS,,,  Endo/Other  negative endocrine ROS    Renal/GU negative Renal ROS  negative genitourinary   Musculoskeletal  (+) Arthritis ,    Abdominal   Peds  Hematology negative hematology ROS (+)   Anesthesia Other Findings   Reproductive/Obstetrics negative OB ROS                             Anesthesia Physical Anesthesia Plan  ASA: 3  Anesthesia Plan: General   Post-op Pain Management: Tylenol  PO (pre-op)*   Induction: Intravenous  PONV Risk Score and Plan: 3 and Ondansetron  and Dexamethasone   Airway Management Planned: Oral ETT  Additional Equipment:   Intra-op Plan:   Post-operative Plan: Extubation in OR  Informed Consent: I have reviewed the patients History and Physical, chart, labs and discussed the procedure including the risks, benefits and alternatives for the proposed anesthesia with the patient or authorized representative who has indicated his/her understanding and acceptance.     Dental advisory given  Plan Discussed with: CRNA  Anesthesia Plan Comments: (PAT note written 09/14/2023 by Allison Zelenak, PA-C.  )       Anesthesia Quick Evaluation
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
## 2023-09-15 ENCOUNTER — Ambulatory Visit (HOSPITAL_COMMUNITY): Payer: Self-pay | Admitting: Vascular Surgery

## 2023-09-15 ENCOUNTER — Ambulatory Visit (HOSPITAL_BASED_OUTPATIENT_CLINIC_OR_DEPARTMENT_OTHER): Payer: Medicare Other

## 2023-09-15 ENCOUNTER — Other Ambulatory Visit: Payer: Self-pay

## 2023-09-15 ENCOUNTER — Encounter (HOSPITAL_COMMUNITY)
Admission: RE | Disposition: A | Discharge status: Home / Self Care | Payer: Self-pay | Source: Home / Self Care | Attending: General Surgery

## 2023-09-15 ENCOUNTER — Ambulatory Visit (HOSPITAL_COMMUNITY)
Admission: RE | Admit: 2023-09-15 | Discharge: 2023-09-15 | Disposition: A | Discharge status: Home / Self Care | Payer: Medicare Other | Attending: General Surgery | Admitting: General Surgery

## 2023-09-15 ENCOUNTER — Encounter (HOSPITAL_COMMUNITY): Payer: Self-pay | Admitting: General Surgery

## 2023-09-15 DIAGNOSIS — K4020 Bilateral inguinal hernia, without obstruction or gangrene, not specified as recurrent: Secondary | ICD-10-CM

## 2023-09-15 DIAGNOSIS — I2510 Atherosclerotic heart disease of native coronary artery without angina pectoris: Secondary | ICD-10-CM | POA: Diagnosis not present

## 2023-09-15 DIAGNOSIS — I10 Essential (primary) hypertension: Secondary | ICD-10-CM | POA: Insufficient documentation

## 2023-09-15 DIAGNOSIS — Z79899 Other long term (current) drug therapy: Secondary | ICD-10-CM | POA: Insufficient documentation

## 2023-09-15 DIAGNOSIS — Z951 Presence of aortocoronary bypass graft: Secondary | ICD-10-CM | POA: Diagnosis not present

## 2023-09-15 DIAGNOSIS — I252 Old myocardial infarction: Secondary | ICD-10-CM | POA: Insufficient documentation

## 2023-09-15 DIAGNOSIS — I110 Hypertensive heart disease with heart failure: Secondary | ICD-10-CM

## 2023-09-15 DIAGNOSIS — I5022 Chronic systolic (congestive) heart failure: Secondary | ICD-10-CM

## 2023-09-15 HISTORY — PX: INGUINAL HERNIA REPAIR: SHX194

## 2023-09-15 SURGERY — REPAIR, HERNIA, INGUINAL, LAPAROSCOPIC
Anesthesia: General | Laterality: Bilateral

## 2023-09-15 MED ORDER — ONDANSETRON HCL 4 MG/2ML IJ SOLN
INTRAMUSCULAR | Status: DC | PRN
  Administered 2023-09-15: 4 mg via INTRAVENOUS

## 2023-09-15 MED ORDER — ACETAMINOPHEN 500 MG PO TABS
1000.0000 mg | ORAL_TABLET | ORAL | Status: AC
  Administered 2023-09-15: 1000 mg via ORAL
  Filled 2023-09-15: qty 2

## 2023-09-15 MED ORDER — LACTATED RINGERS IV SOLN: INTRAVENOUS | Status: DC

## 2023-09-15 MED ORDER — DEXAMETHASONE SODIUM PHOSPHATE 10 MG/ML IJ SOLN
INTRAMUSCULAR | Status: AC
  Filled 2023-09-15: qty 1

## 2023-09-15 MED ORDER — ORAL CARE MOUTH RINSE: 15.0000 mL | Freq: Once | OROMUCOSAL | Status: AC

## 2023-09-15 MED ORDER — CEFAZOLIN SODIUM-DEXTROSE 2-4 GM/100ML-% IV SOLN
2.0000 g | INTRAVENOUS | Status: AC
  Administered 2023-09-15: 2 g via INTRAVENOUS
  Filled 2023-09-15: qty 100

## 2023-09-15 MED ORDER — BUPIVACAINE HCL (PF) 0.25 % IJ SOLN
INTRAMUSCULAR | Status: AC
  Filled 2023-09-15: qty 30

## 2023-09-15 MED ORDER — CHLORHEXIDINE GLUCONATE 0.12 % MT SOLN
15.0000 mL | Freq: Once | OROMUCOSAL | Status: AC
  Administered 2023-09-15: 15 mL via OROMUCOSAL
  Filled 2023-09-15: qty 15

## 2023-09-15 MED ORDER — FENTANYL CITRATE (PF) 100 MCG/2ML IJ SOLN
INTRAMUSCULAR | Status: AC
  Filled 2023-09-15: qty 2

## 2023-09-15 MED ORDER — LIDOCAINE 2% (20 MG/ML) 5 ML SYRINGE
INTRAMUSCULAR | Status: DC | PRN
  Administered 2023-09-15: 60 mg via INTRAVENOUS

## 2023-09-15 MED ORDER — ENSURE PRE-SURGERY PO LIQD: 296.0000 mL | Freq: Once | ORAL | Status: DC

## 2023-09-15 MED ORDER — FENTANYL CITRATE (PF) 250 MCG/5ML IJ SOLN
INTRAMUSCULAR | Status: DC | PRN
  Administered 2023-09-15: 50 ug via INTRAVENOUS

## 2023-09-15 MED ORDER — 0.9 % SODIUM CHLORIDE (POUR BTL) OPTIME
TOPICAL | Status: DC | PRN
  Administered 2023-09-15: 1000 mL

## 2023-09-15 MED ORDER — ROCURONIUM BROMIDE 10 MG/ML (PF) SYRINGE
PREFILLED_SYRINGE | INTRAVENOUS | Status: AC
  Filled 2023-09-15: qty 10

## 2023-09-15 MED ORDER — ONDANSETRON HCL 4 MG/2ML IJ SOLN
INTRAMUSCULAR | Status: AC
  Filled 2023-09-15: qty 2

## 2023-09-15 MED ORDER — EPHEDRINE SULFATE-NACL 50-0.9 MG/10ML-% IV SOSY
PREFILLED_SYRINGE | INTRAVENOUS | Status: DC | PRN
  Administered 2023-09-15: 5 mg via INTRAVENOUS

## 2023-09-15 MED ORDER — PROPOFOL 10 MG/ML IV BOLUS
INTRAVENOUS | Status: DC | PRN
  Administered 2023-09-15: 120 mg via INTRAVENOUS

## 2023-09-15 MED ORDER — PHENYLEPHRINE 80 MCG/ML (10ML) SYRINGE FOR IV PUSH (FOR BLOOD PRESSURE SUPPORT)
PREFILLED_SYRINGE | INTRAVENOUS | Status: DC | PRN
  Administered 2023-09-15 (×3): 80 ug via INTRAVENOUS

## 2023-09-15 MED ORDER — BUPIVACAINE HCL 0.25 % IJ SOLN
INTRAMUSCULAR | Status: DC | PRN
  Administered 2023-09-15: 6 mL

## 2023-09-15 MED ORDER — ROCURONIUM BROMIDE 10 MG/ML (PF) SYRINGE
PREFILLED_SYRINGE | INTRAVENOUS | Status: DC | PRN
  Administered 2023-09-15: 60 mg via INTRAVENOUS

## 2023-09-15 MED ORDER — DEXAMETHASONE SODIUM PHOSPHATE 10 MG/ML IJ SOLN
INTRAMUSCULAR | Status: DC | PRN
  Administered 2023-09-15: 5 mg via INTRAVENOUS

## 2023-09-15 MED ORDER — PROPOFOL 10 MG/ML IV BOLUS
INTRAVENOUS | Status: AC
  Filled 2023-09-15: qty 20

## 2023-09-15 MED ORDER — CHLORHEXIDINE GLUCONATE CLOTH 2 % EX PADS: 6.0000 | MEDICATED_PAD | Freq: Once | CUTANEOUS | Status: DC

## 2023-09-15 MED ORDER — FENTANYL CITRATE (PF) 250 MCG/5ML IJ SOLN
INTRAMUSCULAR | Status: AC
  Filled 2023-09-15: qty 5

## 2023-09-15 MED ORDER — LIDOCAINE 2% (20 MG/ML) 5 ML SYRINGE
INTRAMUSCULAR | Status: AC
  Filled 2023-09-15: qty 5

## 2023-09-15 MED ORDER — TRAMADOL HCL 50 MG PO TABS: 50.0000 mg | ORAL_TABLET | Freq: Four times a day (QID) | ORAL | 0 refills | Status: AC | PRN

## 2023-09-15 MED ORDER — SUGAMMADEX SODIUM 200 MG/2ML IV SOLN
INTRAVENOUS | Status: DC | PRN
  Administered 2023-09-15: 300 mg via INTRAVENOUS

## 2023-09-15 MED ORDER — FENTANYL CITRATE (PF) 100 MCG/2ML IJ SOLN
25.0000 ug | INTRAMUSCULAR | Status: DC | PRN
  Administered 2023-09-15: 50 ug via INTRAVENOUS

## 2023-09-15 SURGICAL SUPPLY — 39 items
BAG COUNTER SPONGE SURGICOUNT (BAG) ×2 IMPLANT
CANISTER SUCT 3000ML PPV (MISCELLANEOUS) IMPLANT
COVER SURGICAL LIGHT HANDLE (MISCELLANEOUS) ×2 IMPLANT
DERMABOND ADVANCED .7 DNX12 (GAUZE/BANDAGES/DRESSINGS) ×2 IMPLANT
DERMABOND ADVANCED .7 DNX6 (GAUZE/BANDAGES/DRESSINGS) IMPLANT
DISSECTOR BLUNT TIP ENDO 5MM (MISCELLANEOUS) IMPLANT
ELECT REM PT RETURN 9FT ADLT (ELECTROSURGICAL) ×1
ELECTRODE REM PT RTRN 9FT ADLT (ELECTROSURGICAL) ×2 IMPLANT
ENDOLOOP SUT PDS II 0 18 (SUTURE) IMPLANT
GLOVE BIO SURGEON STRL SZ7.5 (GLOVE) ×4 IMPLANT
GOWN STRL REUS W/ TWL LRG LVL3 (GOWN DISPOSABLE) ×4 IMPLANT
GOWN STRL REUS W/ TWL XL LVL3 (GOWN DISPOSABLE) ×2 IMPLANT
IRRIG SUCT STRYKERFLOW 2 WTIP (MISCELLANEOUS)
IRRIGATION SUCT STRKRFLW 2 WTP (MISCELLANEOUS) IMPLANT
KIT BASIN OR (CUSTOM PROCEDURE TRAY) ×2 IMPLANT
KIT TURNOVER KIT B (KITS) ×2 IMPLANT
MESH 3DMAX 5X7 LT XLRG (Mesh General) IMPLANT
MESH 3DMAX 5X7 RT XLRG (Mesh General) IMPLANT
NDL INSUFFLATION 14GA 120MM (NEEDLE) IMPLANT
NEEDLE INSUFFLATION 14GA 120MM (NEEDLE)
NS IRRIG 1000ML POUR BTL (IV SOLUTION) ×2 IMPLANT
PAD ARMBOARD 7.5X6 YLW CONV (MISCELLANEOUS) ×4 IMPLANT
RELOAD STAPLE 4.0 BLU F/HERNIA (INSTRUMENTS) IMPLANT
RELOAD STAPLE 4.8 BLK F/HERNIA (STAPLE) IMPLANT
RELOAD STAPLE HERNIA 4.0 BLUE (INSTRUMENTS)
RELOAD STAPLE HERNIA 4.8 BLK (STAPLE)
SCISSORS LAP 5X35 DISP (ENDOMECHANICALS) ×2 IMPLANT
SET TUBE SMOKE EVAC HIGH FLOW (TUBING) ×2 IMPLANT
STAPLER HERNIA 12 8.5 360D (INSTRUMENTS) IMPLANT
SUT MNCRL AB 4-0 PS2 18 (SUTURE) ×2 IMPLANT
SUT VIC AB 1 CT1 27XBRD ANBCTR (SUTURE) IMPLANT
TOWEL GREEN STERILE (TOWEL DISPOSABLE) ×2 IMPLANT
TOWEL GREEN STERILE FF (TOWEL DISPOSABLE) ×2 IMPLANT
TRAY LAPAROSCOPIC MC (CUSTOM PROCEDURE TRAY) ×2 IMPLANT
TROCAR OPTICAL SHORT 5MM (TROCAR) ×2 IMPLANT
TROCAR OPTICAL SLV SHORT 5MM (TROCAR) ×2 IMPLANT
TROCAR Z THREAD OPTICAL 12X100 (TROCAR) ×2 IMPLANT
WARMER LAPAROSCOPE (MISCELLANEOUS) ×2 IMPLANT
WATER STERILE IRR 1000ML POUR (IV SOLUTION) ×2 IMPLANT

## 2023-09-15 NOTE — Anesthesia Procedure Notes (Signed)
 Procedure Name: Intubation Date/Time: 09/15/2023 8:58 AM  Performed by: Elby Raelene SAUNDERS, CRNAPre-anesthesia Checklist: Patient identified, Emergency Drugs available, Suction available and Patient being monitored Patient Re-evaluated:Patient Re-evaluated prior to induction Oxygen Delivery Method: Circle System Utilized Preoxygenation: Pre-oxygenation with 100% oxygen Induction Type: IV induction Ventilation: Mask ventilation without difficulty Laryngoscope Size: Miller and 2 Grade View: Grade II Tube type: Oral Tube size: 7.5 mm Number of attempts: 1 Airway Equipment and Method: Stylet Placement Confirmation: ETT inserted through vocal cords under direct vision, positive ETCO2 and breath sounds checked- equal and bilateral Secured at: 24 cm Tube secured with: Tape Dental Injury: Teeth and Oropharynx as per pre-operative assessment

## 2023-09-15 NOTE — Anesthesia Postprocedure Evaluation (Signed)
 Anesthesia Post Note  Patient: Edwin Morris  Procedure(s) Performed: LAPAROSCOPIC BILATERAL INGUINAL HERNIA REPAIR WITH MESH (Bilateral)     Patient location during evaluation: PACU Anesthesia Type: General Level of consciousness: awake and alert Pain management: pain level controlled Vital Signs Assessment: post-procedure vital signs reviewed and stable Respiratory status: spontaneous breathing, nonlabored ventilation and respiratory function stable Cardiovascular status: blood pressure returned to baseline and stable Postop Assessment: no apparent nausea or vomiting Anesthetic complications: no  No notable events documented.  Last Vitals:  Vitals:   09/15/23 1015 09/15/23 1030  BP: 138/65 138/63  Pulse: (!) 59 61  Resp: 16 19  Temp:  36.4 C  SpO2: 96% 96%    Last Pain:  Vitals:   09/15/23 1000  PainSc: 8                  Anhelica Fowers,W. EDMOND

## 2023-09-15 NOTE — Discharge Instructions (Signed)

## 2023-09-15 NOTE — Op Note (Signed)
 09/15/2023  9:43 AM  PATIENT:  Edwin Morris  78 y.o. male  PRE-OPERATIVE DIAGNOSIS:  Bilateral Inguinal Hernia  POST-OPERATIVE DIAGNOSIS:  Bilateral Pantaloon Inguinal Hernias  PROCEDURE:  Procedure(s): LAPAROSCOPIC BILATERAL INGUINAL HERNIA REPAIR WITH MESH (Bilateral)  SURGEON:  Surgeons and Role:    DEWAINE Rubin Calamity, MD - Primary   ASSISTANTS: Waddell Collier, RNFA   ANESTHESIA:   local and general  EBL:  minimal   BLOOD ADMINISTERED:none  DRAINS: none   LOCAL MEDICATIONS USED:  BUPIVICAINE   SPECIMEN:  No Specimen  DISPOSITION OF SPECIMEN:  N/A  COUNTS:  YES  TOURNIQUET:  * No tourniquets in log *  DICTATION: .Dragon Dictation   Counts: reported as correct x 2  Findings:  The patient had a small right & left pantaloon hernias  Indications for procedure:  The patient is a 78 year old male with bilateral hernias for several months. Patient complained of symptomatology to his bilateral inguinal areas. The patient was taken back for elective inguinal hernia repair.  Details of the procedure: The patient was taken back to the operating room. The patient was placed in supine position with bilateral SCDs in place.  The patient underwent GETA.  The patient was prepped and draped in the usual sterile fashion.  After appropriate anitbiotics were confirmed, a time-out was confirmed and all facts were verified.  0.25% Marcaine  was used to infiltrate the umbilical area. A 11-blade was used to cut down the skin and blunt dissection was used to get the anterior fashion.  The anterior fascia was incised approximately 1 cm and the muscles were retracted laterally. Blunt dissection was then used to create a space in the preperitoneal area. At this time a 10 mm camera was then introduced into the space and advanced the pubic tubercle and a 12 mm trocar was placed over this and insufflation was started.  At this time and space was created from medial to laterally the preperitoneal  space.  Cooper's ligament was initially cleaned off.  There was a small direct hernia seen and the preperitoneal fat was dissected away from the transversalis fascia.  This retracted spontaneously.  The peritoneum was identified and dissected away from the cremesteric muscle fibers.  The hernia was seen in the indirect space as well. Dissection of the hernia sac was undertaken the vas deferens was identified and protected in all parts of the case.   There was a small tear into the hernia sac. A Veress needle right upper quadrant to help evacuate the intraperitoneal air.    Once the hernia sac was taken down to approximately the umbilicus a Bard 3D Max mesh, size: Nickola, was  introduced into the preperitoneal space.  The mesh was brought over to cover the direct and indirect hernia spaces.  This was anchored into place and secured to Cooper's ligament with 4.68mm staples from a Coviden hernia stapler. It was anchored to the anterior abdominal wall with 4.8 mm staples. The hernia sac was seen lying posterior to the mesh. There was no staples placed laterally.   The exact same dissection took place on the opposite side. A space was created from medial to laterally the preperitoneal space.  Cooper's ligament was initially cleaned off.  There was a moderate direct hernia seen and the preperitoneal fat was dissected away from the transversalis fascia.  This retracted spontaneously.  The peritoneum was identified and dissected away from the cremesteric muscle fibers.  The hernia was seen in the indirect space as well. Dissection  of the hernia sac was undertaken the vas deferens was identified and protected in all parts of the case.   Once the hernia sac was taken down to approximately the umbilicus a Bard 3D Max mesh, size: Nickola, was  introduced into the preperitoneal space.  The mesh was brought over to cover the direct and indirect hernia spaces.  This was anchored into place and secured to Cooper's ligament  with 4.67mm staples from a Coviden hernia stapler. It was anchored to the anterior abdominal wall with 4.8 mm staples. The hernia sac was seen lying posterior to the mesh. There was no staples placed laterally   The insufflation was evacuated and the peritoneum was seen posterior to the mesh bilaterally. The trochars were removed. The anterior fascia was reapproximated using #1 Vicryl on a UR- 6.  Intra-abdominal air was evacuated and the Veress needle removed. The skin was reapproximated using 4-0 Monocryl subcuticular fashion and was dressed with Dermabond. The  patient was awakened from general anesthesia and taken to recovery in stable condition.    PLAN OF CARE: Discharge to home after PACU  PATIENT DISPOSITION:  PACU - hemodynamically stable.   Delay start of Pharmacological VTE agent (>24hrs) due to surgical blood loss or risk of bleeding: not applicable

## 2023-09-15 NOTE — Interval H&P Note (Signed)
 History and Physical Interval Note:  09/15/2023 8:17 AM  Edwin Morris  has presented today for surgery, with the diagnosis of Bilateral Inguinal Hernia.  The various methods of treatment have been discussed with the patient and family. After consideration of risks, benefits and other options for treatment, the patient has consented to  Procedure(s): LAPAROSCOPIC BILATERAL INGUINAL HERNIA REPAIR WITH MESH (Bilateral) as a surgical intervention.  The patient's history has been reviewed, patient examined, no change in status, stable for surgery.  I have reviewed the patient's chart and labs.  Questions were answered to the patient's satisfaction.     Edwin Morris

## 2023-09-15 NOTE — Transfer of Care (Signed)
 Immediate Anesthesia Transfer of Care Note  Patient: Edwin Morris  Procedure(s) Performed: LAPAROSCOPIC BILATERAL INGUINAL HERNIA REPAIR WITH MESH (Bilateral)  Patient Location: PACU  Anesthesia Type:General  Level of Consciousness: awake and alert   Airway & Oxygen Therapy: Patient Spontanous Breathing  Post-op Assessment: Report given to RN and Post -op Vital signs reviewed and stable  Post vital signs: Reviewed and stable  Last Vitals:  Vitals Value Taken Time  BP 141/83 09/15/23 0955  Temp    Pulse 60 09/15/23 1000  Resp 16 09/15/23 1000  SpO2 95 % 09/15/23 1000  Vitals shown include unfiled device data.  Last Pain:  Vitals:   09/15/23 0815  PainSc: 0-No pain         Complications: No notable events documented.

## 2023-09-16 ENCOUNTER — Encounter (HOSPITAL_COMMUNITY): Payer: Self-pay | Admitting: General Surgery

## 2023-09-22 DIAGNOSIS — K080 Exfoliation of teeth due to systemic causes: Secondary | ICD-10-CM | POA: Diagnosis not present

## 2023-09-28 DIAGNOSIS — K080 Exfoliation of teeth due to systemic causes: Secondary | ICD-10-CM | POA: Diagnosis not present

## 2023-10-15 DIAGNOSIS — L821 Other seborrheic keratosis: Secondary | ICD-10-CM | POA: Diagnosis not present

## 2023-10-15 DIAGNOSIS — D485 Neoplasm of uncertain behavior of skin: Secondary | ICD-10-CM | POA: Diagnosis not present

## 2023-10-15 DIAGNOSIS — D2261 Melanocytic nevi of right upper limb, including shoulder: Secondary | ICD-10-CM | POA: Diagnosis not present

## 2023-10-15 DIAGNOSIS — D225 Melanocytic nevi of trunk: Secondary | ICD-10-CM | POA: Diagnosis not present

## 2023-10-15 DIAGNOSIS — D692 Other nonthrombocytopenic purpura: Secondary | ICD-10-CM | POA: Diagnosis not present

## 2023-10-15 DIAGNOSIS — D216 Benign neoplasm of connective and other soft tissue of trunk, unspecified: Secondary | ICD-10-CM | POA: Diagnosis not present

## 2023-10-15 DIAGNOSIS — L570 Actinic keratosis: Secondary | ICD-10-CM | POA: Diagnosis not present

## 2023-12-07 ENCOUNTER — Other Ambulatory Visit: Payer: Self-pay | Admitting: Cardiology

## 2024-01-14 ENCOUNTER — Other Ambulatory Visit: Payer: Self-pay | Admitting: Cardiology

## 2024-01-14 DIAGNOSIS — I255 Ischemic cardiomyopathy: Secondary | ICD-10-CM

## 2024-01-14 DIAGNOSIS — I10 Essential (primary) hypertension: Secondary | ICD-10-CM

## 2024-02-13 ENCOUNTER — Other Ambulatory Visit: Payer: Self-pay | Admitting: Cardiology

## 2024-02-13 DIAGNOSIS — I10 Essential (primary) hypertension: Secondary | ICD-10-CM

## 2024-02-13 DIAGNOSIS — I255 Ischemic cardiomyopathy: Secondary | ICD-10-CM

## 2024-04-04 ENCOUNTER — Other Ambulatory Visit: Payer: Self-pay | Admitting: Cardiology

## 2024-04-04 DIAGNOSIS — K080 Exfoliation of teeth due to systemic causes: Secondary | ICD-10-CM | POA: Diagnosis not present

## 2024-05-10 ENCOUNTER — Encounter (HOSPITAL_BASED_OUTPATIENT_CLINIC_OR_DEPARTMENT_OTHER): Payer: Self-pay | Admitting: Family

## 2024-05-10 ENCOUNTER — Ambulatory Visit (HOSPITAL_BASED_OUTPATIENT_CLINIC_OR_DEPARTMENT_OTHER): Admitting: Family

## 2024-05-10 VITALS — BP 104/64 | HR 64 | Ht 72.0 in | Wt 176.0 lb

## 2024-05-10 DIAGNOSIS — I10 Essential (primary) hypertension: Secondary | ICD-10-CM

## 2024-05-10 DIAGNOSIS — I25118 Atherosclerotic heart disease of native coronary artery with other forms of angina pectoris: Secondary | ICD-10-CM

## 2024-05-10 DIAGNOSIS — E785 Hyperlipidemia, unspecified: Secondary | ICD-10-CM

## 2024-05-10 DIAGNOSIS — Z951 Presence of aortocoronary bypass graft: Secondary | ICD-10-CM

## 2024-05-10 NOTE — Patient Instructions (Signed)
 Medication Instructions:  Continue your current medications *If you need a refill on your cardiac medications before your next appointment, please call your pharmacy*  Follow-Up: At [facility], you and your health needs are our priority.  As part of our continuing mission to provide you with exceptional heart care, our providers are all part of one team.  This team includes your primary Cardiologist (physician) and Advanced Practice Providers or APPs (Physician Assistants and Nurse Practitioners) who all work together to provide you with the care you need, when you need it.  Your next appointment:   1 year(s)  Provider:   Shelda Bruckner, MD, Rosaline Bane, NP, or Reche Finder, NP    We recommend signing up for the patient portal called MyChart.  Sign up information is provided on this After Visit Summary.  MyChart is used to connect with patients for Virtual Visits (Telemedicine).  Patients are able to view lab/test results, encounter notes, upcoming appointments, etc.  Non-urgent messages can be sent to your provider as well.   To learn more about what you can do with MyChart, go to [email or web address].   Other Instructions Your LDL (bad cholesterol) in March 2025 when checked at the [place] was 2 which is at goal of <70. Good result!   Keep up the great work with heart healthy diet and regular exercise!

## 2024-05-10 NOTE — Progress Notes (Signed)
 Cardiology Office Note   Date:  05/10/2024  ID:  Edwin Morris, DOB Apr 14, 1946, MRN [id number] PCP: Pura Lenis, MD  [facility] Providers Cardiologist:  Shelda Bruckner, MD     History of Present Illness Edwin Morris is a 78 y.o. male with history of VF arrest 10/2017 found to have STEMI requiring CABG X5.  LVEF improved to 50-55%.  Previously with gynecomastia on spironolactone  transition to eplerenone .  Presents today for follow-up independently.  Feeling overall well since last seen.  Ex[place].  Endorses eating predominantly at home and following a low-sodium diet.  Enjoys spending time at the beach with his wife. Reports no shortness of breath nor dyspnea on exertion. Reports no chest pain, pressure, or tightness. No edema, orthopnea, PND. Reports no palpitations.  Labs with [facility] 10/2023 ldl 51  ROS: Please see the history of present illness.    All other systems reviewed and are negative.   Studies Reviewed      Cardiac Studies & Procedures   ______________________________________________________________________________________________ CARDIAC CATHETERIZATION  CARDIAC CATHETERIZATION 11/08/2017  Conclusion  Prox LAD lesion is 85% stenosed.  Ost 1st Diag lesion is 85% stenosed.  Ost 1st Sept lesion is 95% stenosed.  Mid LAD lesion is 85% stenosed.  Ost 1st Diag to 1st Diag lesion is 70% stenosed.  Dist LM to Ost LAD lesion is 60% stenosed.  Ost Cx lesion is 65% stenosed.  Prox Cx lesion is 50% stenosed.  Mid Cx lesion is 50% stenosed.  Mid RCA lesion is 60% stenosed.  Prox RCA to Mid RCA lesion is 70% stenosed.  Dist RCA lesion is 90% stenosed.  LV end diastolic pressure is normal.  There is moderate to severe left ventricular systolic dysfunction.  Acute coronary syndrome/STEMI complicated by VF cardiac arrest during EMS transfer to [facility] hospital, treated probably with CPR/multiple defibrillations with  restoration of sinus rhythm.  Significant cardiac calcification and multivessel CAD with 60% ostial LAD stenosis, complex trifurcation stenosis of the proximal LAD, diagonal and septal perforating artery of 85-90, 95%, followed by diffuse 85% stenosis in the mid LAD after a sharp angle in the vessel with 70% mid diagonal stenosis; 60-70% ostial stenosis of the circumflex vessel followed by 40-50% proximal stenosis and 40-50% distal stenosis after distal marginal branch in a co-dominant circumflex system; and diffusely diseased mid RCA with narrowing of 60-70% with 90% distal RCA stenosis.  Moderately severe LV function with an EF of 35% with hypocontractility and distal anterolateral wall and inferoapical to distal inferior hypokinesis.  RECOMMENDATION: There is TIMI-3 flow presently in all vessels.  With significant coronary calcification and complex anatomy of the LAD with multivessel CAD, including ostial disease of the LAD and circumflex, will continue Aggrastat  for at least 18 hours and obtain surgical consultation in a.m. for consideration of CABG revascularization surgery.  Eill ask angiograms to  be reviewed by colleagues.  Heparin  will be restarted.  The patient was started on IV amiodarone .  During the procedure and will continue with infusion.  High potency statin therapy.  Findings Coronary Findings Diagnostic  Dominance: Right  Left Main Dist LM to Ost LAD lesion is 60% stenosed.  Left Anterior Descending Prox LAD lesion is 85% stenosed. Mid LAD lesion is 85% stenosed. The lesion is calcified.  First Diagonal Branch Ost 1st Diag lesion is 85% stenosed. Ost 1st Diag to 1st Diag lesion is 70% stenosed.  First Septal Branch Ost 1st Sept lesion is 95% stenosed.  Left Circumflex Ost Cx lesion is 65% stenosed. Prox Cx lesion is 50% stenosed. Mid Cx lesion is 50% stenosed.  Right Coronary Artery Prox RCA to Mid RCA lesion is 70% stenosed. Mid RCA lesion is 60% stenosed. Dist  RCA lesion is 90% stenosed.  Intervention  No interventions have been documented.     ECHOCARDIOGRAM  ECHOCARDIOGRAM COMPLETE 02/04/2018  Narrative *[facility]* [address] [place], [place] [address] [phone]  ------------------------------------------------------------------- Transthoracic Echocardiography  (Report amended )  Patient:    Tennis, Mckinnon MR #:       [id number] Study Date: 02/04/2018 Gender:     M Age:        89 Height:     182.9 cm Weight:     73.7 kg BSA:        1.93 m^2 Pt. Status: Room:  SONOGRAPHER  Waldo Guadalajara, RCS ATTENDING    Bensimhon, Toribio FINES     Bensimhon, Daniel REFERRING    Bensimhon, Daniel PERFORMING   [facility], Outpatient  cc:  ------------------------------------------------------------------- LV EF: 50% -   55%  ------------------------------------------------------------------- Indications:      CHF (I50.22).  ------------------------------------------------------------------- History:   PMH:  HLD.  ------------------------------------------------------------------- Study Conclusions  - Left ventricle: The cavity size was normal. There was moderate concentric hypertrophy. Systolic function was normal. The estimated ejection fraction was in the range of 50% to 55%. Wall motion was normal; there were no regional wall motion abnormalities. Doppler parameters are consistent with abnormal left ventricular relaxation (grade 1 diastolic dysfunction). There was no evidence of elevated ventricular filling pressure by Doppler parameters. - Aortic valve: Trileaflet; moderately thickened, moderately calcified leaflets. Valve mobility was restricted. There was mild regurgitation. - Mitral valve: There was mild regurgitation. - Right ventricle: The cavity size was mildly dilated. Wall thickness was normal. Systolic function was normal. - Right atrium: The atrium was normal in size. - Tricuspid valve: There  was mild regurgitation. - Pulmonary arteries: Systolic pressure was within the normal range. - Inferior vena cava: The vessel was normal in size. The respirophasic diameter changes were in the normal range (= 50%), consistent with normal central venous pressure. - Pericardium, extracardiac: There was no pericardial effusion.  Impressions:  - Normal global longitudinal strain: -19.9 %. Lateral S prime 10 cm/s.  ------------------------------------------------------------------- Labs, prior tests, procedures, and surgery: Coronary artery bypass grafting.  ------------------------------------------------------------------- Study data:  Comparison was made to the study of 11/09/2017.  Study status:  Routine.  Procedure:  The patient reported no pain pre or post test. Transthoracic echocardiography. Image quality was adequate.          Transthoracic echocardiography.  M-mode, complete 2D, spectral Doppler, and color Doppler.  Birthdate: Patient birthdate: 1946/03/24.  Age:  Patient is 78 yr old.  Sex: Gender: male.    BMI: 22 kg/m^2.  Blood pressure:     116/65 Patient status:  Outpatient.  Study date:  Study date: 02/04/2018. Study time: 09:32 AM.  Location:  [facility]  -------------------------------------------------------------------  ------------------------------------------------------------------- Left ventricle:  The cavity size was normal. There was moderate concentric hypertrophy. Systolic function was normal. The estimated ejection fraction was in the range of 50% to 55%. Wall motion was normal; there were no regional wall motion abnormalities. Doppler parameters are consistent with abnormal left ventricular relaxation (grade 1 diastolic dysfunction). There was no evidence of elevated ventricular filling pressure by Doppler parameters.  ------------------------------------------------------------------- Aortic valve:   Trileaflet; moderately thickened,  moderately calcified leaflets. Valve mobility was  restricted.  Doppler: Transvalvular velocity was within the normal range. There was no stenosis. There was mild regurgitation.  ------------------------------------------------------------------- Aorta:  Aortic root: The aortic root was normal in size.  ------------------------------------------------------------------- Mitral valve:   Structurally normal valve.   Mobility was not restricted.  Doppler:  Transvalvular velocity was within the normal range. There was no evidence for stenosis. There was mild regurgitation.  ------------------------------------------------------------------- Left atrium:  The atrium was normal in size.  ------------------------------------------------------------------- Right ventricle:  The cavity size was mildly dilated. Wall thickness was normal. Systolic function was normal.  ------------------------------------------------------------------- Pulmonic valve:    Doppler:  Transvalvular velocity was within the normal range. There was no evidence for stenosis.  ------------------------------------------------------------------- Tricuspid valve:   Structurally normal valve.    Doppler: Transvalvular velocity was within the normal range. There was mild regurgitation.  ------------------------------------------------------------------- Pulmonary artery:   The main pulmonary artery was normal-sized. Systolic pressure was within the normal range.  ------------------------------------------------------------------- Right atrium:  The atrium was normal in size.  ------------------------------------------------------------------- Pericardium:  There was no pericardial effusion.  ------------------------------------------------------------------- Systemic veins: Inferior vena cava: The vessel was normal in size. The respirophasic diameter changes were in the normal range (= 50%), consistent with normal  central venous pressure. Diameter: 17 mm.  ------------------------------------------------------------------- Measurements  IVC                                        Value        Reference ID                                         17    mm     ----------  Left ventricle                             Value        Reference LV ID, ED, PLAX chordal             (L)    37.4  mm     43 - 52 LV ID, ES, PLAX chordal                    30.8  mm     23 - 38 LV fx shortening, PLAX chordal      (L)    18    %      >=29 LV PW thickness, ED                        13.7  mm     ---------- IVS/LV PW ratio, ED                        0.88         <=1.3 Stroke volume, 2D                          73    ml     ---------- Stroke volume/bsa, 2D                      38    ml/m^2 ---------- LV e&', lateral  13.8  cm/s   ---------- LV E/e&', lateral                           3.97         ---------- LV e&', medial                              5     cm/s   ---------- LV E/e&', medial                            10.96        ---------- LV e&', average                             9.4   cm/s   ---------- LV E/e&', average                           5.83         ---------- Longitudinal strain, TDI                   20    %      ----------  Ventricular septum                         Value        Reference IVS thickness, ED                          12.1  mm     ----------  LVOT                                       Value        Reference LVOT ID, S                                 21    mm     ---------- LVOT area                                  3.46  cm^2   ---------- LVOT peak velocity, S                      92.4  cm/s   ---------- LVOT mean velocity, S                      51.6  cm/s   ---------- LVOT VTI, S                                21.2  cm     ----------  Aortic valve                               Value        Reference Aortic regurg pressure half-time  434   ms      ----------  Aorta                                      Value        Reference Aortic root ID, ED                         35    mm     ----------  Left atrium                                Value        Reference LA ID, A-P, ES                             38    mm     ---------- LA ID/bsa, A-P                             1.97  cm/m^2 <=2.2 LA volume, S                               60.2  ml     ---------- LA volume/bsa, S                           31.2  ml/m^2 ---------- LA volume, ES, 1-p A4C                     53.8  ml     ---------- LA volume/bsa, ES, 1-p A4C                 27.9  ml/m^2 ---------- LA volume, ES, 1-p A2C                     66.1  ml     ---------- LA volume/bsa, ES, 1-p A2C                 34.2  ml/m^2 ----------  Mitral valve                               Value        Reference Mitral E-wave peak velocity                54.8  cm/s   ---------- Mitral A-wave peak velocity                52.9  cm/s   ---------- Mitral deceleration time            (H)    454   ms     150 - 230 Mitral E/A ratio, peak                     1            ----------  Pulmonary arteries                         Value        Reference  PA pressure, S, DP                         18    mm Hg  <=30  Tricuspid valve                            Value        Reference Tricuspid regurg peak velocity             195   cm/s   ---------- Tricuspid peak RV-RA gradient              15    mm Hg  ---------- Tricuspid maximal regurg velocity,         195   cm/s   ---------- PISA  Right atrium                               Value        Reference RA ID, S-I, ES, A4C                 (H)    61.3  mm     34 - 49 RA area, ES, A4C                           18.5  cm^2   8.3 - 19.5 RA volume, ES, A/L                         48.7  ml     ---------- RA volume/bsa, ES, A/L                     25.2  ml/m^2 ----------  Systemic veins                             Value        Reference Estimated CVP                               3     mm Hg  ----------  Right ventricle                            Value        Reference TAPSE                                      13    mm     ---------- RV pressure, S, DP                         18    mm Hg  <=30 RV s&', lateral, S                          11.5  cm/s   ----------  Legend: (L)  and  (H)  mark values outside specified reference range.  ------------------------------------------------------------------- Delman Leim Moose, M.D. 2019-06-06T14:27:42   TEE  ECHO TEE 11/13/2017  Interpretation Summary  Aortic valve: The valve is trileaflet.  Mild valve thickening present. Trace regurgitation.  Mitral valve: Non-specific thickening. Mild leaflet thickening is present. Mild regurgitation.  Right ventricle: Normal cavity size, wall thickness and ejection fraction.  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 Trace regurgitation. There is mild prolapse in the septal leaflet.        ______________________________________________________________________________________________      Risk Assessment/Calculations           Physical Exam VS:  BP 104/64 (BP Location: Left Arm, Patient Position: Sitting, Cuff Size: Normal)   Pulse 64   Ht 6' (1.829 m)   Wt 176 lb (79.8 kg)   SpO2 96%   BMI 23.87 kg/m        Wt Readings from Last 3 Encounters:  05/10/24 176 lb (79.8 kg)  09/15/23 165 lb (74.8 kg)  09/11/23 171 lb 4.8 oz (77.7 kg)    GEN: Well nourished, well developed in no acute distress NECK: No JVD; No carotid bruits CARDIAC: RRR, no murmurs, rubs, gallops RESPIRATORY:  Clear to auscultation without rales, wheezing or rhonchi  ABDOMEN: Soft, non-tender, non-distended EXTREMITIES:  No edema; No deformity   ASSESSMENT AND PLAN  CAD s/p CABG / HLD, LDL goal <70 - Stable with no anginal symptoms. No indication for ischemic evaluation.  GDMT aspirin  81mg  daily, atorvastatin  80 mg daily, Zetia  10 mg daily, carvedilol  3.125 mg twice daily.  10/2023  LDL 51.  Recommend aiming for 150 minutes of moderate intensity activity per week and following a heart healthy diet.    HTN - BP well controlled. Continue current antihypertensive regimen Coreg  3.125mg  BID, eplerenone  12.5 mg daily, losartan  25mg  daily. Discussed to monitor BP at home at least 2 hours after medications and sitting for 5-10 minutes.        Dispo: follow up in 1 year  Signed, Reche GORMAN Finder, NP

## 2024-05-19 ENCOUNTER — Other Ambulatory Visit: Payer: Self-pay | Admitting: Cardiology

## 2024-05-19 DIAGNOSIS — I10 Essential (primary) hypertension: Secondary | ICD-10-CM

## 2024-05-19 DIAGNOSIS — I255 Ischemic cardiomyopathy: Secondary | ICD-10-CM

## 2024-05-19 DIAGNOSIS — I2510 Atherosclerotic heart disease of native coronary artery without angina pectoris: Secondary | ICD-10-CM

## 2024-05-19 DIAGNOSIS — E785 Hyperlipidemia, unspecified: Secondary | ICD-10-CM

## 2024-07-12 DIAGNOSIS — Z125 Encounter for screening for malignant neoplasm of prostate: Secondary | ICD-10-CM | POA: Diagnosis not present

## 2024-07-12 DIAGNOSIS — R3912 Poor urinary stream: Secondary | ICD-10-CM | POA: Diagnosis not present

## 2024-08-04 DIAGNOSIS — H5212 Myopia, left eye: Secondary | ICD-10-CM | POA: Diagnosis not present

## 2024-08-05 ENCOUNTER — Other Ambulatory Visit: Payer: Self-pay | Admitting: Cardiology

## 2024-08-05 DIAGNOSIS — I255 Ischemic cardiomyopathy: Secondary | ICD-10-CM
